# Patient Record
Sex: Male | Born: 1975 | Race: White | Hispanic: No | Marital: Married | Smoking: Never smoker
Health system: Southern US, Community
[De-identification: ages and names within clinical notes are randomized; demographics above are authoritative.]

## PROBLEM LIST (undated history)

## (undated) ENCOUNTER — Emergency Department: Payer: BC Managed Care – PPO

## (undated) ENCOUNTER — Emergency Department (HOSPITAL_BASED_OUTPATIENT_CLINIC_OR_DEPARTMENT_OTHER): Admission: EM | Payer: BLUE CROSS/BLUE SHIELD | Source: Home / Self Care

## (undated) DIAGNOSIS — I10 Essential (primary) hypertension: Secondary | ICD-10-CM

## (undated) DIAGNOSIS — F1010 Alcohol abuse, uncomplicated: Secondary | ICD-10-CM

## (undated) DIAGNOSIS — Z86010 Personal history of colonic polyps: Secondary | ICD-10-CM

## (undated) DIAGNOSIS — Z87442 Personal history of urinary calculi: Secondary | ICD-10-CM

## (undated) DIAGNOSIS — M549 Dorsalgia, unspecified: Secondary | ICD-10-CM

## (undated) DIAGNOSIS — E66812 Obesity, class 2: Secondary | ICD-10-CM

## (undated) DIAGNOSIS — K602 Anal fissure, unspecified: Secondary | ICD-10-CM

## (undated) DIAGNOSIS — L409 Psoriasis, unspecified: Secondary | ICD-10-CM

## (undated) DIAGNOSIS — G4700 Insomnia, unspecified: Secondary | ICD-10-CM

## (undated) DIAGNOSIS — F32A Depression, unspecified: Secondary | ICD-10-CM

## (undated) DIAGNOSIS — F419 Anxiety disorder, unspecified: Secondary | ICD-10-CM

## (undated) DIAGNOSIS — K760 Fatty (change of) liver, not elsewhere classified: Secondary | ICD-10-CM

## (undated) DIAGNOSIS — E119 Type 2 diabetes mellitus without complications: Secondary | ICD-10-CM

## (undated) DIAGNOSIS — R635 Abnormal weight gain: Secondary | ICD-10-CM

## (undated) DIAGNOSIS — R7303 Prediabetes: Secondary | ICD-10-CM

## (undated) DIAGNOSIS — E669 Obesity, unspecified: Secondary | ICD-10-CM

## (undated) DIAGNOSIS — N529 Male erectile dysfunction, unspecified: Secondary | ICD-10-CM

## (undated) DIAGNOSIS — R0602 Shortness of breath: Secondary | ICD-10-CM

## (undated) DIAGNOSIS — E785 Hyperlipidemia, unspecified: Secondary | ICD-10-CM

## (undated) DIAGNOSIS — E291 Testicular hypofunction: Secondary | ICD-10-CM

## (undated) HISTORY — DX: Anxiety disorder, unspecified: F41.9

## (undated) HISTORY — DX: Fatty (change of) liver, not elsewhere classified: K76.0

## (undated) HISTORY — DX: Depression, unspecified: F32.A

## (undated) HISTORY — DX: Abnormal weight gain: R63.5

## (undated) HISTORY — DX: Psoriasis, unspecified: L40.9

## (undated) HISTORY — PX: APPENDECTOMY: SHX54

## (undated) HISTORY — DX: Type 2 diabetes mellitus without complications: E11.9

## (undated) HISTORY — PX: OTHER SURGICAL HISTORY: SHX169

## (undated) HISTORY — DX: Prediabetes: R73.03

## (undated) HISTORY — DX: Dorsalgia, unspecified: M54.9

## (undated) HISTORY — DX: Hyperlipidemia, unspecified: E78.5

## (undated) HISTORY — DX: Personal history of colonic polyps: Z86.010

## (undated) HISTORY — DX: Testicular hypofunction: E29.1

## (undated) HISTORY — DX: Anal fissure, unspecified: K60.2

## (undated) HISTORY — DX: Obesity, class 2: E66.812

## (undated) HISTORY — DX: Male erectile dysfunction, unspecified: N52.9

## (undated) HISTORY — DX: Obesity, unspecified: E66.9

## (undated) HISTORY — DX: Alcohol abuse, uncomplicated: F10.10

## (undated) HISTORY — DX: Shortness of breath: R06.02

## (undated) HISTORY — DX: Essential (primary) hypertension: I10

## (undated) HISTORY — DX: Insomnia, unspecified: G47.00

---

## 1999-02-26 HISTORY — PX: HAND SURGERY: SHX662

## 2000-04-09 ENCOUNTER — Encounter: Payer: Self-pay | Admitting: *Deleted

## 2000-04-09 ENCOUNTER — Ambulatory Visit (HOSPITAL_COMMUNITY): Admission: RE | Admit: 2000-04-09 | Discharge: 2000-04-09 | Payer: Self-pay | Admitting: *Deleted

## 2000-04-12 ENCOUNTER — Encounter: Payer: Self-pay | Admitting: Orthopedic Surgery

## 2000-04-12 ENCOUNTER — Ambulatory Visit (HOSPITAL_COMMUNITY): Admission: RE | Admit: 2000-04-12 | Discharge: 2000-04-12 | Payer: Self-pay | Admitting: Orthopedic Surgery

## 2004-08-02 ENCOUNTER — Ambulatory Visit: Payer: Self-pay | Admitting: Family Medicine

## 2005-01-30 ENCOUNTER — Ambulatory Visit: Payer: Self-pay | Admitting: Internal Medicine

## 2005-03-08 ENCOUNTER — Ambulatory Visit: Payer: Self-pay | Admitting: Family Medicine

## 2006-10-04 ENCOUNTER — Emergency Department (HOSPITAL_COMMUNITY): Admission: EM | Admit: 2006-10-04 | Discharge: 2006-10-04 | Payer: Self-pay | Admitting: Family Medicine

## 2007-03-09 ENCOUNTER — Ambulatory Visit: Payer: Self-pay | Admitting: Family Medicine

## 2007-03-09 DIAGNOSIS — R030 Elevated blood-pressure reading, without diagnosis of hypertension: Secondary | ICD-10-CM | POA: Insufficient documentation

## 2007-03-09 DIAGNOSIS — R42 Dizziness and giddiness: Secondary | ICD-10-CM | POA: Insufficient documentation

## 2007-09-04 ENCOUNTER — Ambulatory Visit: Payer: Self-pay | Admitting: Family Medicine

## 2007-09-04 DIAGNOSIS — F411 Generalized anxiety disorder: Secondary | ICD-10-CM | POA: Insufficient documentation

## 2007-09-04 DIAGNOSIS — J029 Acute pharyngitis, unspecified: Secondary | ICD-10-CM | POA: Insufficient documentation

## 2007-09-04 DIAGNOSIS — F329 Major depressive disorder, single episode, unspecified: Secondary | ICD-10-CM | POA: Insufficient documentation

## 2007-09-04 LAB — CONVERTED CEMR LAB: Rapid Strep: NEGATIVE

## 2007-11-18 ENCOUNTER — Telehealth: Payer: Self-pay | Admitting: Family Medicine

## 2008-01-18 ENCOUNTER — Ambulatory Visit: Payer: Self-pay | Admitting: Family Medicine

## 2008-01-18 DIAGNOSIS — G4700 Insomnia, unspecified: Secondary | ICD-10-CM | POA: Insufficient documentation

## 2009-11-28 ENCOUNTER — Telehealth: Payer: Self-pay | Admitting: Family Medicine

## 2009-12-05 ENCOUNTER — Ambulatory Visit: Payer: Self-pay | Admitting: Family Medicine

## 2009-12-05 LAB — CONVERTED CEMR LAB
ALT: 89 units/L — ABNORMAL HIGH (ref 0–53)
AST: 57 units/L — ABNORMAL HIGH (ref 0–37)
Albumin: 4.4 g/dL (ref 3.5–5.2)
Alkaline Phosphatase: 60 units/L (ref 39–117)
BUN: 15 mg/dL (ref 6–23)
Basophils Absolute: 0 10*3/uL (ref 0.0–0.1)
Basophils Relative: 0.8 % (ref 0.0–3.0)
Bilirubin Urine: NEGATIVE
Bilirubin, Direct: 0.1 mg/dL (ref 0.0–0.3)
Blood in Urine, dipstick: NEGATIVE
CO2: 25 meq/L (ref 19–32)
Calcium: 9.3 mg/dL (ref 8.4–10.5)
Chloride: 103 meq/L (ref 96–112)
Cholesterol: 243 mg/dL — ABNORMAL HIGH (ref 0–200)
Creatinine, Ser: 1.2 mg/dL (ref 0.4–1.5)
Direct LDL: 170.5 mg/dL
Eosinophils Absolute: 0.2 10*3/uL (ref 0.0–0.7)
Eosinophils Relative: 4.7 % (ref 0.0–5.0)
GFR calc non Af Amer: 74.67 mL/min (ref >60)
Glucose, Bld: 87 mg/dL (ref 70–99)
Glucose, Urine, Semiquant: NEGATIVE
HCT: 46 % (ref 39.0–52.0)
HDL: 38.5 mg/dL — ABNORMAL LOW (ref >39.00)
Hemoglobin: 16 g/dL (ref 13.0–17.0)
Ketones, urine, test strip: NEGATIVE
Lymphocytes Relative: 40.1 % (ref 12.0–46.0)
Lymphs Abs: 2 10*3/uL (ref 0.7–4.0)
MCHC: 34.8 g/dL (ref 30.0–36.0)
MCV: 94.4 fL (ref 78.0–100.0)
Monocytes Absolute: 0.4 10*3/uL (ref 0.1–1.0)
Monocytes Relative: 8.8 % (ref 3.0–12.0)
Neutro Abs: 2.2 10*3/uL (ref 1.4–7.7)
Neutrophils Relative %: 45.6 % (ref 43.0–77.0)
Nitrite: NEGATIVE
Platelets: 164 10*3/uL (ref 150.0–400.0)
Potassium: 3.9 meq/L (ref 3.5–5.1)
Protein, U semiquant: NEGATIVE
RBC: 4.88 M/uL (ref 4.22–5.81)
RDW: 12.2 % (ref 11.5–14.6)
Sodium: 137 meq/L (ref 135–145)
Specific Gravity, Urine: 1.025
TSH: 1.43 microintl units/mL (ref 0.35–5.50)
Total Bilirubin: 0.9 mg/dL (ref 0.3–1.2)
Total CHOL/HDL Ratio: 6
Total Protein: 7.5 g/dL (ref 6.0–8.3)
Triglycerides: 179 mg/dL — ABNORMAL HIGH (ref 0.0–149.0)
Urobilinogen, UA: 0.2
VLDL: 35.8 mg/dL (ref 0.0–40.0)
WBC Urine, dipstick: NEGATIVE
WBC: 4.9 10*3/uL (ref 4.5–10.5)
pH: 5.5

## 2009-12-08 ENCOUNTER — Ambulatory Visit: Payer: Self-pay | Admitting: Family Medicine

## 2009-12-08 DIAGNOSIS — E1159 Type 2 diabetes mellitus with other circulatory complications: Secondary | ICD-10-CM | POA: Insufficient documentation

## 2009-12-08 DIAGNOSIS — M549 Dorsalgia, unspecified: Secondary | ICD-10-CM | POA: Insufficient documentation

## 2009-12-08 DIAGNOSIS — I10 Essential (primary) hypertension: Secondary | ICD-10-CM

## 2009-12-08 DIAGNOSIS — I152 Hypertension secondary to endocrine disorders: Secondary | ICD-10-CM | POA: Insufficient documentation

## 2009-12-11 ENCOUNTER — Telehealth: Payer: Self-pay | Admitting: Family Medicine

## 2009-12-22 ENCOUNTER — Ambulatory Visit: Payer: Self-pay | Admitting: Psychology

## 2010-01-03 ENCOUNTER — Ambulatory Visit: Payer: Self-pay | Admitting: Psychology

## 2010-01-08 ENCOUNTER — Ambulatory Visit: Payer: Self-pay | Admitting: Family Medicine

## 2010-01-26 ENCOUNTER — Ambulatory Visit: Payer: Self-pay | Admitting: Psychology

## 2010-02-02 ENCOUNTER — Ambulatory Visit: Payer: Self-pay | Admitting: Psychology

## 2010-02-07 ENCOUNTER — Telehealth (INDEPENDENT_AMBULATORY_CARE_PROVIDER_SITE_OTHER): Payer: Self-pay | Admitting: *Deleted

## 2010-02-16 ENCOUNTER — Ambulatory Visit: Payer: Self-pay | Admitting: Psychology

## 2010-03-27 NOTE — Progress Notes (Signed)
Summary: med refill last prescribed by dr little  Phone Note Call from Patient Call back at Home Phone [phone]   Caller: Patient Call For: Dorena Cookey MD Summary of Call: pt needs refill lisinopril 5m call into [facility] [phone]. Pt is sch for cpx 12-08-2009. DR Little prescribed bp med. Initial call taken by: NGlo Herring  November 28, 2009 10:31 AM  Follow-up for Phone Call        dispense a hundred tablets, directions, one q.a.m. for high blood pressure, refills x 3 Follow-up by: JDorena CookeyMD,  November 28, 2009 10:54 AM    New/Updated Medications: LISINOPRIL 10 MG TABS (LISINOPRIL) take one tab by mouth every morning Prescriptions: LISINOPRIL 10 MG TABS (LISINOPRIL) take one tab by mouth every morning  #100 x 3   Entered by:   RWestley HummerCMA (AP[place]   Authorized by:   JDorena CookeyMD   Signed by:   RWestley HummerCMA (AT[place] on 11/28/2009   Method used:   Electronically to        [facility] #[id number] (retail)       [address]      [place]  [address]      Ph: [phone] [phone]      Fax: [phone]  RxID:   [id number]

## 2010-03-27 NOTE — Progress Notes (Signed)
  Phone Note Outgoing Call   Summary of Call: I called Josef to explain to him.  His x-rays.  I'm not sure if that 4-mm slip is significant or not.  Will start with physical therapy.  The pain does not resolve with physical therapy and will refer her to neurosurgery for epidural steroid injection Initial call taken by: Dorena Cookey MD,  December 11, 2009 1:45 PM

## 2010-03-27 NOTE — Assessment & Plan Note (Signed)
Summary: 1 month f/u//alp   Vital Signs:  Patient profile:   35 year old male Weight:      238 pounds Temp:     98.4 degrees F oral BP sitting:   110 / 80  (left arm) Cuff size:   regular  Vitals Entered By: Westley Hummer CMA Deborra Medina) (January 08, 2010 11:45 AM) CC: follow-up visit   CC:  follow-up visit.  History of Present Illness: Tony [name] is a 35 year old, single male, who comes in today for evaluation of multiple issues.  His underlying hypertension, treated with lisinopril, 10 mg daily, BP 110/80.  His been seeing Dr. Cheryln [name] for two sessions now for evaluation and treatment of anxiety and depression.  He feels like this is going well and was to continue his sessions.  He takes Ativan and/or Ambien nightly p.r.n. for sleep.  He continues to have chronic low back pain.  We recommended physical therapy and/or neurologic evaluation with the potential for epidural steroid injections.  Pamala Hurry declines this at this time because of cost.  To try to treated home on his own.  He states the Motrin, 600 mg twice daily with food.  He spent off all alcohol for one month.  Will recheck his labs  Allergies: No Known Drug Allergies  Review of Systems      See HPI  Physical Exam  General:  Well-developed,well-nourished,in no acute distress; alert,appropriate and cooperative throughout examination   Impression & Recommendations:  Problem # 1:  BACK PAIN (ICD-724.5) Assessment Improved  Orders: Specimen Handling (99000)  Problem # 2:  HYPERTENSION (ICD-401.9) Assessment: Improved  His updated medication list for this problem includes:    Lisinopril 10 Mg Tabs (Lisinopril) .Marland Kitchen... Take one tab by mouth every morning  Orders: Venipuncture (70623) TLB-BMP (Basic Metabolic Panel-BMET) (76283-TDVVOHY) TLB-Hepatic/Liver Function Pnl (80076-HEPATIC) Specimen Handling (99000)  Problem # 3:  INSOMNIA (ICD-780.52) Assessment: Improved  His updated medication list for this  problem includes:    Ambien 5 Mg Tabs (Zolpidem tartrate) .Marland Kitchen... Prn  Orders: Specimen Handling (99000)  Complete Medication List: 1)  Ativan 1 Mg Tabs (Lorazepam) .Marland Kitchen.. 1 tab @ bedtime 2)  Lisinopril 10 Mg Tabs (Lisinopril) .... Take one tab by mouth every morning 3)  Ambien 5 Mg Tabs (Zolpidem tartrate) .... Prn  Patient Instructions: 1)  continue your medications.  I will call you when I get your  Laboratory. work back...........Marland Kitchen Another option would be to consider acupuncture.........Marland Kitchen Curtis Sites...............   Orders Added: 1)  Venipuncture [07371] 2)  TLB-BMP (Basic Metabolic Panel-BMET) [06269-SWNIOEV] 3)  TLB-Hepatic/Liver Function Pnl [80076-HEPATIC] 4)  Est. Patient Level III [03500] 5)  Specimen Handling [93818]

## 2010-03-27 NOTE — Assessment & Plan Note (Signed)
Summary: cpx/njr   Vital Signs:  Patient profile:   35 year old male Height:      70 inches Weight:      241 pounds BMI:     34.70 Temp:     98.2 degrees F oral Pulse rate:   90 / minute Pulse rhythm:   regular BP sitting:   120 / 80  Vitals Entered By: Deanna Artis CMA (December 08, 2009 8:42 AM) CC: cpx Is Patient Diabetic? No Pain Assessment Patient in pain? no        CC:  cpx.  History of Present Illness: Tony [name] is a 35 year old single male, nonsmoker, who comes in today for physical evaluation because of underlying hypertension.  A new problem of low back pain and panic attacks.  His hypertension is treated with lisinopril 10 mg daily.  BP 120/80.  He also takes Ambien 5 mg nightly p.r.n. for sleep dysfunction and lorazepam 1 mg p.r.n. for panic attacks.  We discussed risks, options for treating the panic attacks however, he is not interested in this point was seeing a psychologist.  I will continue to encourage him to see Dr. Cheryln [name].  He said low back pain for the past 7 months.  No history of trauma.  He states his back pain is constant, Jesse Sans, the sharp, [employer] scale of one to 8, is located in the mid lumbar back area does not radiate down his legs.  The pain is decreased by walking and increase by twisting and playing golf.  He never had a problem.  His back like this before .  He states he is a nonsmoker and does drink a few beers on the weekends.  Current Medications (verified): 1)  Klonopin 0.5 Mg  Tabs (Clonazepam) .Marland Kitchen.. 1 Tab @ Bedtime 2)  Hydromet 5-1.5 Mg/53m Syrp (Hydrocodone-Homatropine) ..Marland Kitchen. 1 or 2 Tsp Tid As Needed 3)  Ativan 1 Mg Tabs (Lorazepam) ..Marland Kitchen. 1 Tab @ Bedtime 4)  Lisinopril 10 Mg Tabs (Lisinopril) .... Take One Tab By Mouth Every Morning  Allergies: No Known Drug Allergies  Past History:  Past medical, surgical, family and social histories (including risk factors) reviewed, and no changes noted (except as noted  below).  Past Medical History: Reviewed history from 09/04/2007 and no changes required. ADD Anxiety Depression  Family History: Reviewed history from 09/04/2007 and no changes required. Family History Hypertension Family History of CAD Male 1st degree relative <60  Social History: Reviewed history from 03/09/2007 and no changes required. Occupation: Single Alcohol use-no Drug use-no Regular exercise-yes  Review of Systems      See HPI  Physical Exam  General:  Well-developed,well-nourished,in no acute distress; alert,appropriate and cooperative throughout examination Head:  Normocephalic and atraumatic without obvious abnormalities. No apparent alopecia or balding. Eyes:  No corneal or conjunctival inflammation noted. EOMI. Perrla. Funduscopic exam benign, without hemorrhages, exudates or papilledema. Vision grossly normal. Ears:  External ear exam shows no significant lesions or deformities.  Otoscopic examination reveals clear canals, tympanic membranes are intact bilaterally without bulging, retraction, inflammation or discharge. Hearing is grossly normal bilaterally. Nose:  External nasal examination shows no deformity or inflammation. Nasal mucosa are pink and moist without lesions or exudates. Mouth:  Oral mucosa and oropharynx without lesions or exudates.  Teeth in good repair. Neck:  No deformities, masses, or tenderness noted. Chest Wall:  No deformities, masses, tenderness or gynecomastia noted. Breasts:  No masses or gynecomastia noted Lungs:  Normal respiratory effort, chest expands symmetrically. Lungs are  clear to auscultation, no crackles or wheezes. Heart:  Normal rate and regular rhythm. S1 and S2 normal without gallop, murmur, click, rub or other extra sounds. Abdomen:  Bowel sounds positive,abdomen soft and non-tender without masses, organomegaly or hernias noted. Genitalia:  Testes bilaterally descended without nodularity, tenderness or masses. No scrotal  masses or lesions. No penis lesions or urethral discharge. Msk:  No deformity or scoliosis noted of thoracic or lumbar spine.   Pulses:  R and L carotid,radial,femoral,dorsalis pedis and posterior tibial pulses are full and equal bilaterally Extremities:  No clubbing, cyanosis, edema, or deformity noted with normal full range of motion of all joints.   Neurologic:  No cranial nerve deficits noted. Station and gait are normal. Plantar reflexes are down-going bilaterally. DTRs are symmetrical throughout. Sensory, motor and coordinative functions appear intact. Skin:  Intact without suspicious lesions or rashes Cervical Nodes:  No lymphadenopathy noted Axillary Nodes:  No palpable lymphadenopathy Inguinal Nodes:  No significant adenopathy Psych:  Oriented X3 and slightly anxious.     Problems:  Medical Problems Added: 1)  Dx of Routine General Medical Exam@[facility]  (ICD-V70.0) 2)  Dx of Back Pain  (ICD-724.5) 3)  Dx of Hypertension  (ICD-401.9)  Impression & Recommendations:  Problem # 1:  INSOMNIA (ICD-780.52) Assessment Unchanged  His updated medication list for this problem includes:    Ambien 5 Mg Tabs (Zolpidem tartrate) .Marland Kitchen... Prn  Orders: Prescription Created Electronically [phone])  Problem # 2:  ANXIETY (ICD-300.00) Assessment: Unchanged  The following medications were removed from the medication list:    Klonopin 0.5 Mg Tabs (Clonazepam) .Marland Kitchen... 1 tab @ bedtime His updated medication list for this problem includes:    Ativan 1 Mg Tabs (Lorazepam) .Marland Kitchen... 1 tab @ bedtime  Orders: Prescription Created Electronically [phone])  Problem # 3:  HYPERTENSION (ICD-401.9) Assessment: Improved  His updated medication list for this problem includes:    Lisinopril 10 Mg Tabs (Lisinopril) .Marland Kitchen... Take one tab by mouth every morning  Orders: Prescription Created Electronically [phone])  Problem # 4:  BACK PAIN (ICD-724.5) Assessment: New  Orders: T-Lumbar Spine w/Flex &  Ext 4 Views [phone]) Prescription Created Electronically [phone]) Physical Therapy Referral (PT)  Complete Medication List: 1)  Ativan 1 Mg Tabs (Lorazepam) .Marland Kitchen.. 1 tab @ bedtime 2)  Lisinopril 10 Mg Tabs (Lisinopril) .... Take one tab by mouth every morning 3)  Ambien 5 Mg Tabs (Zolpidem tartrate) .... Prn  Other Orders: Admin 1st Vaccine (38453) Flu Vaccine 59yr + [phone]  Patient Instructions: 1)  go to the main office for x-rays of your back.  I will call you the report. 2)  We will also do to set up for physical therapy to help relieve the pain.  In the meantime take Motrin 600 mg twice daily. 3)  I would recommend Dr. DApolonio Schneiderspsychologist to address the issues of panic that we discussed. 4)  Avoid all alcohol. 5)  Return in 4 weeks for follow-up Prescriptions: AMBIEN 5 MG TABS (ZOLPIDEM TARTRATE) prn  #30 x 3   Entered and Authorized by:   JDorena CookeyMD   Signed by:   JDorena CookeyMD on 12/08/2009   Method used:   Print then Give to Patient   RxID:   [id number] 1 MG TABS (LORAZEPAM) 1 tab @ bedtime  #30 x 3   Entered and Authorized by:   JDorena CookeyMD   Signed by:   JDorena CookeyMD on 12/08/2009  Method used:   Print then Give to Patient   RxID:   [id number] LISINOPRIL 10 MG TABS (LISINOPRIL) take one tab by mouth every morning  #100 x 3   Entered and Authorized by:   Dorena Cookey MD   Signed by:   Dorena Cookey MD on 12/08/2009   Method used:   Electronically to        [facility]  [phone]* (retail)       [address]       [place], [place]  [address]       Ph: [phone] or [phone]       Fax: [phone]   RxID:   [id number]  Flu Vaccine Consent Questions     Do you have a history of severe allergic reactions to this vaccine? no    Any prior history of allergic reactions to egg and/or gelatin? no    Do you have a sensitivity to the preservative Thimersol? no    Do you have a  past history of Guillan-Barre Syndrome? no    Do you currently have an acute febrile illness? no    Have you ever had a severe reaction to latex? no    Vaccine information given and explained to patient? yes    Are you currently pregnant? no    Lot Number:[id number]   Exp Date:08/25/2010   Site Given  Left Deltoid IM       Fax: [phone]   RxID:   [id number]    .[email or web address]

## 2010-03-29 NOTE — Progress Notes (Signed)
  Phone Note Outgoing Call   Call placed by: Joyce Gross Summary of Call: Called patient and he said he would return for labs on Tues. or Wed. of next week.

## 2010-07-13 NOTE — Op Note (Signed)
[place]  Patient:    Tony [name], Tony [name]                    MRN: [id number] Proc. Date: 04/12/00 Attending:  Rob Hickman, M.D.                           Operative Report  DATE OF BIRTH: [birth year]  PREOPERATIVE DIAGNOSIS: Small finger proximal phalanx fracture, displaced with angulation at apex volarly.  POSTOPERATIVE DIAGNOSIS: Small finger proximal phalanx fracture, displaced with angulation at apex volarly.  OPERATION/PROCEDURE: Closed reduction and percutaneous pinning with Eaton-Belsky technique using .045 Kirschner wires.  SURGEON: Rob Hickman, M.D.  ASSISTANTElodia [name]. Clabe Seal, P.A.  ANESTHESIA: LMA, general.  COMPLICATIONS: None.  TOURNIQUET TIME: None.  ESTIMATED BLOOD LOSS: Minimal.  INDICATIONS FOR PROCEDURE: This patient is a very pleasant 35 year old white male the above mentioned diagnosis.  He has angulation and displacement.  I have discussed with him the risks and benefits of surgical intervention including risk of infection, bleeding, anesthesia, damage to normal structures and need for further surgery, and with this in mind he has asked to proceed. All questions have been answered preoperatively.  OPERATIVE FINDINGS: Displaced proximal phalanx fracture was reduced successfully and pinned without difficulty using Eaton-Belsky technique.  The patient had full range of motion about the MCP and PIP joints after pinning technique was employed, and excellent stability about the fracture site as noted under fluoroscopy.  DESCRIPTION OF PROCEDURE: The patient was seen by myself and anesthesia preoperatively and then taken to the operating room suite and smooth induction of anesthesia was accomplished without difficulty.  Following this he was placed in the supine position and appropriately padded and prepped and draped in the usual sterile fashion about the left upper extremity.  Once this was done the  patient underwent a manipulative reduction technique about the left small finger, which was displaced and angulated.  Reduction technique accomplished adequate reduction.  Following this Eaton-Belsky pinning technique was performed.  This was done by entering about the dorsal aspect of the hand and pinning the proximal phalanx with two 0.045 K wires, which centered down the shaft of the proximal phalanx.  The MCP joint and PIP joint were not encroached upon.  The patient had wires placed without difficulty and excellent stability was noted under fluoroscopy.  I was happy with the position and alignment, and nail bed direction toward the scaphoid tuberosity. The patient had excellent splay of his fingers, no significant displacement, and recreation of normal anatomy to a satisfactory degree.  I was happy with this and following this pins were cut and clipped and pin caps were placed.  A sterile compressive dressing was placed followed by volar and dorsal plaster splints.  ______ without difficulty.  He tolerated the procedure well without difficulty.  He was awakened from general anesthesia and taken to the recovery room in stable condition.  Ann sponge, needle, and instrument counts were reported as correct.  There were no immediate or intraoperative complications.  FOLLOW-UP: The patient will be given Keflex, Percocet, Phenergan, and Robaxin for postoperative pharmacologic management.  He will return to our office in seven to ten days for follow-up, and is to notify me should any problems, questions, or concerns arise, or abrupt changes occur prior to seven to ten days. DD:  04/12/00 TD:  04/13/00 Job: [id number]

## 2010-09-05 ENCOUNTER — Other Ambulatory Visit: Payer: Self-pay | Admitting: Family Medicine

## 2010-12-10 LAB — POCT RAPID STREP A: Streptococcus, Group A Screen (Direct): NEGATIVE

## 2011-08-26 ENCOUNTER — Other Ambulatory Visit: Payer: Self-pay | Admitting: *Deleted

## 2011-08-26 MED ORDER — LISINOPRIL 10 MG PO TABS: 10.0000 mg | ORAL_TABLET | Freq: Every day | ORAL | Status: DC

## 2011-09-30 ENCOUNTER — Other Ambulatory Visit: Payer: Self-pay | Admitting: Family Medicine

## 2011-10-30 ENCOUNTER — Other Ambulatory Visit: Payer: Self-pay | Admitting: Family Medicine

## 2011-10-30 ENCOUNTER — Telehealth: Payer: Self-pay | Admitting: Family Medicine

## 2011-10-30 NOTE — Telephone Encounter (Signed)
Pt has cpx sch for tomorrow at 2:45pm. Pt said that he can not come in, in the a.m to get lab work done. Pt is req call back from [facility] re: labs. Pt has been informed that refills require an ov with pcp.

## 2011-10-31 ENCOUNTER — Ambulatory Visit (INDEPENDENT_AMBULATORY_CARE_PROVIDER_SITE_OTHER): Payer: BC Managed Care – PPO | Admitting: Family Medicine

## 2011-10-31 ENCOUNTER — Encounter: Payer: Self-pay | Admitting: Family Medicine

## 2011-10-31 VITALS — BP 120/80 | Temp 98.5°F | Ht 69.5 in | Wt 233.0 lb

## 2011-10-31 DIAGNOSIS — R6882 Decreased libido: Secondary | ICD-10-CM

## 2011-10-31 DIAGNOSIS — R200 Anesthesia of skin: Secondary | ICD-10-CM

## 2011-10-31 DIAGNOSIS — F411 Generalized anxiety disorder: Secondary | ICD-10-CM

## 2011-10-31 DIAGNOSIS — L408 Other psoriasis: Secondary | ICD-10-CM

## 2011-10-31 DIAGNOSIS — R209 Unspecified disturbances of skin sensation: Secondary | ICD-10-CM

## 2011-10-31 DIAGNOSIS — G4700 Insomnia, unspecified: Secondary | ICD-10-CM

## 2011-10-31 DIAGNOSIS — L409 Psoriasis, unspecified: Secondary | ICD-10-CM

## 2011-10-31 DIAGNOSIS — I10 Essential (primary) hypertension: Secondary | ICD-10-CM

## 2011-10-31 MED ORDER — CLONAZEPAM 0.5 MG PO TABS: ORAL_TABLET | ORAL | Status: DC

## 2011-10-31 MED ORDER — ZOLPIDEM TARTRATE 5 MG PO TABS: 5.0000 mg | ORAL_TABLET | Freq: Every evening | ORAL | Status: DC | PRN

## 2011-10-31 MED ORDER — TRIAMCINOLONE ACETONIDE 0.025 % EX OINT: TOPICAL_OINTMENT | Freq: Two times a day (BID) | CUTANEOUS | Status: AC

## 2011-10-31 MED ORDER — LISINOPRIL 10 MG PO TABS: 10.0000 mg | ORAL_TABLET | Freq: Every day | ORAL | Status: DC

## 2011-10-31 MED ORDER — CALCIPOTRIENE 0.005 % EX CREA: TOPICAL_CREAM | Freq: Two times a day (BID) | CUTANEOUS | Status: DC

## 2011-10-31 NOTE — Patient Instructions (Signed)
Continue your blood pressure medication daily  For the psoriasis use a combination of Dovonex and the steroid gel twice daily  Klonopin 0.53 times daily for anxiety  Ambien 5 mg one half tab each bedtime when necessary for sleep  The triamcinolone gel for rectal irritation  Zyrtec 10 mg nightly at bedtime for the urticaria  We will check a testosterone level  We will also get you set up for a consult to neurology to evaluate the numbness in your leg

## 2011-10-31 NOTE — Progress Notes (Signed)
Subjective:    Patient ID: Tony [name], male    [name]: 11-30-1975, 36 y.o.   MRN: [id number]  HPI Tony [name] is a 36 year old single male nonsmoker who comes in today for evaluation of hypertension  He takes lisinopril 10 mg daily BP 120/80  We've referred him to Dr. Geraldine [name] last winter because of anxiety and depression. He was diagnosed with a general anxiety disorder. 30% of his anxiety went away when he got rid of his girlfriend. Now he's on Klonopin 0.53 times daily and Ambien 5 mg one half tab each bedtime when necessary for sleep. He states he is functioning well and feels well and he has resumed his exercise program.  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 He went to her chiropractor who told him his spine was corrected. The numbness now is constant dull ache at 2 on a scale of 1-10. He also has psoriasis and is involving his scalp and the head of his penis  He also has itching of his rectum at bedtime  He also has urticaria at bedtime for which he takes Benadryl. He also has decreased sex drive and thinks he may have a low testosterone level   Review of Systems  Constitutional: Negative.   HENT: Negative.   Eyes: Negative.   Respiratory: Negative.   Cardiovascular: Negative.   Gastrointestinal: Negative.   Genitourinary: Negative.   Musculoskeletal: Negative.   Skin: Negative.   Neurological: Negative.   Hematological: Negative.   Psychiatric/Behavioral: Negative.        Objective:   Physical Exam  Constitutional: He is oriented to person, place, and time. He appears well-developed and well-nourished.  HENT:  Head: Normocephalic and atraumatic.  Right Ear: External ear normal.  Left Ear: External ear normal.  Nose: Nose normal.  Mouth/Throat: Oropharynx is clear and moist.  Eyes: Conjunctivae and EOM are normal. Pupils are equal, round,  and reactive to light.  Neck: Normal range of motion. Neck supple. No JVD present. No tracheal deviation present. No thyromegaly present.  Cardiovascular: Normal rate, regular rhythm, normal heart sounds and intact distal pulses.  Exam reveals no gallop and no friction rub.   No murmur heard. Pulmonary/Chest: Effort normal and breath sounds normal. No stridor. No respiratory distress. He has no wheezes. He has no rales. He exhibits no tenderness.  Abdominal: Soft. Bowel sounds are normal. He exhibits no distension and no mass. There is no tenderness. There is no rebound and no guarding.  Genitourinary: Penis normal.       Psoriasis around the penis  Musculoskeletal: Normal range of motion. He exhibits no edema and no tenderness.       The legs appear normal the area that he describes is normal in appearance however he says he can feel me touch him but his did sensation is diminished. His pulses did diminish when you raise his legs  Lymphadenopathy:    He has no cervical adenopathy.  Neurological: He is alert and oriented to person, place, and time. He has normal reflexes. No cranial nerve deficit. He exhibits normal muscle tone.  Skin: Skin is warm and dry. No rash noted. No erythema. No pallor.  Psychiatric: He has a normal mood and affect. His behavior is normal. Judgment and thought content normal.          Assessment & Plan:  Healthy male  General anxiety disorder continue Klonopin 0.5 3 times a day  Hypertension continue lisinopril 10 mg daily  Psoriasis continue Dovonex add steroid gel  . Is seen I steroid gel  Urticaria Zyrtec each bedtime  Numbness left anterior thigh refer to neurology for consult

## 2011-10-31 NOTE — Telephone Encounter (Signed)
Spoke with patient.

## 2011-11-05 ENCOUNTER — Other Ambulatory Visit (INDEPENDENT_AMBULATORY_CARE_PROVIDER_SITE_OTHER): Payer: BC Managed Care – PPO

## 2011-11-05 DIAGNOSIS — Z20828 Contact with and (suspected) exposure to other viral communicable diseases: Secondary | ICD-10-CM

## 2011-11-05 DIAGNOSIS — Z202 Contact with and (suspected) exposure to infections with a predominantly sexual mode of transmission: Secondary | ICD-10-CM

## 2011-11-05 DIAGNOSIS — Z206 Contact with and (suspected) exposure to human immunodeficiency virus [HIV]: Secondary | ICD-10-CM

## 2011-11-08 ENCOUNTER — Telehealth: Payer: Self-pay | Admitting: Family Medicine

## 2011-11-08 NOTE — Telephone Encounter (Signed)
Patient called stating that he would like a call back with lab results. Please assist.

## 2011-11-12 ENCOUNTER — Telehealth: Payer: Self-pay | Admitting: *Deleted

## 2011-11-12 NOTE — Telephone Encounter (Signed)
Patient is calling for lab results.

## 2011-11-14 ENCOUNTER — Other Ambulatory Visit: Payer: BC Managed Care – PPO

## 2011-11-14 LAB — CBC WITH DIFFERENTIAL/PLATELET
Basophils Absolute: 0.1 10*3/uL (ref 0.0–0.1)
Eosinophils Absolute: 0.3 10*3/uL (ref 0.0–0.7)
HCT: 45 % (ref 39.0–52.0)
Hemoglobin: 15.4 g/dL (ref 13.0–17.0)
Lymphs Abs: 2.5 10*3/uL (ref 0.7–4.0)
MCHC: 34.3 g/dL (ref 30.0–36.0)
Monocytes Relative: 10 % (ref 3.0–12.0)
Neutro Abs: 2.2 10*3/uL (ref 1.4–7.7)
RDW: 12.2 % (ref 11.5–14.6)

## 2011-11-14 LAB — BASIC METABOLIC PANEL
BUN: 13 mg/dL (ref 6–23)
CO2: 26 mEq/L (ref 19–32)
GFR: 73.82 mL/min (ref >60.00)
Glucose, Bld: 89 mg/dL (ref 70–99)
Potassium: 4 mEq/L (ref 3.5–5.1)

## 2011-11-14 LAB — POCT URINALYSIS DIPSTICK
Bilirubin, UA: NEGATIVE
Blood, UA: NEGATIVE
Nitrite, UA: NEGATIVE
Spec Grav, UA: 1.02
pH, UA: 5

## 2011-11-14 LAB — LIPID PANEL
Total CHOL/HDL Ratio: 7
VLDL: 66 mg/dL — ABNORMAL HIGH (ref 0.0–40.0)

## 2011-11-14 LAB — HEPATIC FUNCTION PANEL
Alkaline Phosphatase: 65 U/L (ref 39–117)
Bilirubin, Direct: 0.1 mg/dL (ref 0.0–0.3)

## 2011-11-20 ENCOUNTER — Telehealth: Payer: Self-pay | Admitting: Family Medicine

## 2011-11-20 NOTE — Telephone Encounter (Signed)
Pt would like blood work results °

## 2011-11-21 ENCOUNTER — Ambulatory Visit (INDEPENDENT_AMBULATORY_CARE_PROVIDER_SITE_OTHER): Payer: BC Managed Care – PPO | Admitting: Family Medicine

## 2011-11-21 DIAGNOSIS — Z23 Encounter for immunization: Secondary | ICD-10-CM

## 2011-11-21 NOTE — Telephone Encounter (Signed)
Pt is sch for today 3pm

## 2011-11-21 NOTE — Telephone Encounter (Signed)
Tony [name] called Tony [name]. went over his lab work. His testosterone level was low and I referred him to the [facility]rology Center. Also 2 of his liver function studies were slightly elevated he will direct no more than 2 beers per day and give followup LFTs in 2 months also cholesterol elevated he will work on his diet and exercise and we need to get a followup lipid panel in 2 months also  So followup lipid and liver panel in 2 months if you could please put that in the computer code number would be abnormal lab tests for the first week in December

## 2011-11-21 NOTE — Telephone Encounter (Signed)
Yes patient need both Tdap and Flu vaccine.  Please schedule.

## 2011-11-21 NOTE — Telephone Encounter (Signed)
Pt called and has sch ov for 01/28/12 at 9:00 as noted. Pt is wondering if he is due for tetanus and flu shot. Pls call.

## 2012-01-06 ENCOUNTER — Telehealth: Payer: Self-pay | Admitting: Family Medicine

## 2012-01-06 NOTE — Telephone Encounter (Signed)
Labs ordered.  Patient has surgery next Wednesday for his ankle.  He would like to know if he should stop any of his meds and if he is okay to have surgery?

## 2012-01-06 NOTE — Telephone Encounter (Signed)
Pt called and is req call back from nurse asap re: paperwork that pt brought in re: labs that Dr Beatrix Fetters ordered. Pt needs to get this lab done this wk asap. Pt req call back from [facility].

## 2012-01-07 NOTE — Telephone Encounter (Signed)
[name] please call,,,,,,,,,,,, he needs to call his surgeon and explain what medications he is on and they will tell him what to do

## 2012-01-07 NOTE — Telephone Encounter (Signed)
Spoke with patient.

## 2012-01-08 ENCOUNTER — Other Ambulatory Visit (INDEPENDENT_AMBULATORY_CARE_PROVIDER_SITE_OTHER): Payer: BC Managed Care – PPO

## 2012-01-08 DIAGNOSIS — E291 Testicular hypofunction: Secondary | ICD-10-CM

## 2012-01-09 LAB — TESTOSTERONE, FREE, TOTAL, SHBG
Testosterone, Free: 61.2 pg/mL (ref 47.0–244.0)
Testosterone-% Free: 2.9 % (ref 1.6–2.9)

## 2012-01-10 ENCOUNTER — Telehealth: Payer: Self-pay | Admitting: Family Medicine

## 2012-01-10 NOTE — Telephone Encounter (Signed)
Pt would like blood work results fax to Dr Dorina Hoyer [phone]. Pt has appt on Monday with Dr Diona Fanti at noon.

## 2012-01-10 NOTE — Telephone Encounter (Signed)
Patient is aware and copy faxed

## 2012-01-22 ENCOUNTER — Other Ambulatory Visit (INDEPENDENT_AMBULATORY_CARE_PROVIDER_SITE_OTHER): Payer: BC Managed Care – PPO

## 2012-01-28 ENCOUNTER — Ambulatory Visit: Payer: BC Managed Care – PPO | Admitting: Family Medicine

## 2012-02-05 ENCOUNTER — Other Ambulatory Visit: Payer: BC Managed Care – PPO

## 2012-02-12 ENCOUNTER — Other Ambulatory Visit: Payer: BC Managed Care – PPO

## 2012-02-13 ENCOUNTER — Other Ambulatory Visit (INDEPENDENT_AMBULATORY_CARE_PROVIDER_SITE_OTHER): Payer: BC Managed Care – PPO

## 2012-02-13 DIAGNOSIS — R799 Abnormal finding of blood chemistry, unspecified: Secondary | ICD-10-CM

## 2012-02-13 DIAGNOSIS — E785 Hyperlipidemia, unspecified: Secondary | ICD-10-CM

## 2012-02-13 DIAGNOSIS — IMO0002 Reserved for concepts with insufficient information to code with codable children: Secondary | ICD-10-CM

## 2012-02-13 LAB — LDL CHOLESTEROL, DIRECT: Direct LDL: 143.7 mg/dL

## 2012-02-13 LAB — LIPID PANEL
HDL: 27.5 mg/dL — ABNORMAL LOW (ref >39.00)
Triglycerides: 488 mg/dL — ABNORMAL HIGH (ref 0.0–149.0)

## 2012-02-13 LAB — HEPATIC FUNCTION PANEL
ALT: 50 U/L (ref 0–53)
Total Protein: 7.1 g/dL (ref 6.0–8.3)

## 2012-02-25 ENCOUNTER — Telehealth: Payer: Self-pay | Admitting: Family Medicine

## 2012-02-25 NOTE — Telephone Encounter (Signed)
Lab results already faxed

## 2012-02-25 NOTE — Telephone Encounter (Signed)
Patient called stating that he would like a call back with lab results as he needs them for surgical clearance. Please assist.

## 2012-03-02 ENCOUNTER — Telehealth: Payer: Self-pay | Admitting: Family Medicine

## 2012-03-02 NOTE — Telephone Encounter (Signed)
Emergent Call:  Called to verify if he has been cleared for surgery 03/04/12.  Please call back.

## 2012-03-02 NOTE — Telephone Encounter (Signed)
[name] please call

## 2012-03-02 NOTE — Telephone Encounter (Signed)
Left detailed message on machine for patient that lipids were out of range, but okay to go forward with the surgery

## 2012-03-02 NOTE — Telephone Encounter (Signed)
ok 

## 2012-03-02 NOTE — Telephone Encounter (Signed)
Patient is requesting lab results - lipids.  He is having reconstructive right ankle surgery Wednesday and would like to know if is okay to go ahead with that?

## 2012-03-02 NOTE — Telephone Encounter (Signed)
Patient Information:  Caller Name: Layken  Phone: [phone]  Patient: Anias, Bartol  Gender: Male  [name]: 02/28/1975  Age: 37 Years  PCP: [facility]  Office Follow Up:  Does the office need to follow up with this patient?: Yes  Instructions For The Office: Please call 03/02/12 to advise of abnormal lipid profile and implications for surgery  On 03/04/12.  RN Note:  Abnormal results noted from 02/13/12 lipid panel. Informed MD will be advised he called for lab  results and call back made from staff with MD recommendations.  Symptoms  Reason For Call & Symptoms:  Emergent Call: for lab results.  Scheduled for Right ankle reconstruction 03/04/12.  Asking if elevated lipids will interfere with surgery.  Reviewed Health History In EMR: N/A  Reviewed Medications In EMR: N/A  Reviewed Allergies In EMR: N/A  Reviewed Surgeries / Procedures: N/A  Date of Onset of Symptoms: 03/02/2012  Guideline(s) Used:  No Protocol Available - Information Only  Disposition Per Guideline:   Discuss with PCP and Callback by Nurse Today  Reason For Disposition Reached:   Nursing judgment  Advice Given:  N/A

## 2012-03-03 ENCOUNTER — Telehealth: Payer: Self-pay | Admitting: Family Medicine

## 2012-03-03 NOTE — Telephone Encounter (Signed)
Pt says he needs blood work prior to his 6 mo follow up appt on July 27, 2012. Do you want me to schedule labs?

## 2012-05-19 ENCOUNTER — Telehealth: Payer: Self-pay | Admitting: Family Medicine

## 2012-05-19 NOTE — Telephone Encounter (Signed)
pls advise

## 2012-05-19 NOTE — Telephone Encounter (Signed)
Patient calling to request change from [place] to [place].  States he tried [place], and it worked better, but it was more expensive due to no generic being available, so he "settled" for [employer].  States has had an insurance change, and would like to go with the [place] at this time.  States he has met his deductible.  Declines new triage or office appt at this time.  Info to office for provider review/Rx/callback.  Uses [facility].  May reach patient at [phone].  krs/can

## 2012-05-20 NOTE — Telephone Encounter (Signed)
Tony [name] please call Tony [name] recommended this juncture that he consult with Dr. Sheralyn Boatman who is an expert in sleep dysfunction since the current medications not working

## 2012-05-20 NOTE — Telephone Encounter (Signed)
Left detailed message on machine for patient

## 2012-05-25 NOTE — Telephone Encounter (Signed)
Patient called from the pharmacy. Wanted to know the status of his Lun[place] prior auth. I explained that we could not do a PA on a rx that we did not write. Asked him to relay that to the pharmacy and that they needed to fax the request back to Dr. Arvil Persons office, as that is who wrote for the [facility]. Pt understood.

## 2012-07-28 ENCOUNTER — Ambulatory Visit: Payer: BC Managed Care – PPO | Admitting: Family Medicine

## 2012-08-12 ENCOUNTER — Telehealth: Payer: Self-pay | Admitting: Family Medicine

## 2012-08-12 NOTE — Telephone Encounter (Addendum)
Pt has order for labs from Dr Tomasa Rand office. Pt states he has gotten them done here before, and lit looks like that is correct.  Is it ok to schedule him a lab appt for these labs?  Pt will bring the order.

## 2012-08-13 ENCOUNTER — Other Ambulatory Visit (INDEPENDENT_AMBULATORY_CARE_PROVIDER_SITE_OTHER): Payer: BC Managed Care – PPO

## 2012-08-13 ENCOUNTER — Ambulatory Visit: Payer: BC Managed Care – PPO | Admitting: Family Medicine

## 2012-08-13 DIAGNOSIS — E291 Testicular hypofunction: Secondary | ICD-10-CM

## 2012-08-13 NOTE — Telephone Encounter (Signed)
Spoke with patient and he will call back for lab appointment

## 2012-08-14 LAB — PROLACTIN: Prolactin: 10 ng/mL (ref 2.1–17.1)

## 2012-08-14 LAB — TESTOSTERONE, FREE, TOTAL, SHBG: Testosterone: 273 ng/dL — ABNORMAL LOW (ref 300–890)

## 2012-08-19 ENCOUNTER — Telehealth: Payer: Self-pay | Admitting: Family Medicine

## 2012-08-19 NOTE — Telephone Encounter (Signed)
PT called and stated that he would like to speak with you regarding labs from 01/2012. Please assist.

## 2012-08-20 ENCOUNTER — Encounter: Payer: Self-pay | Admitting: *Deleted

## 2012-08-20 NOTE — Telephone Encounter (Signed)
Spoke with patient. Copy of lab results mailed to home address and information on My Chart

## 2012-08-24 ENCOUNTER — Encounter: Payer: Self-pay | Admitting: Family Medicine

## 2012-08-24 ENCOUNTER — Ambulatory Visit (INDEPENDENT_AMBULATORY_CARE_PROVIDER_SITE_OTHER): Payer: BC Managed Care – PPO | Admitting: Family Medicine

## 2012-08-24 VITALS — BP 140/80 | Temp 98.8°F | Wt 248.0 lb

## 2012-08-24 DIAGNOSIS — I10 Essential (primary) hypertension: Secondary | ICD-10-CM

## 2012-08-24 NOTE — Progress Notes (Signed)
  Subjective:    Patient ID: Tony [name], male    [name]: 1976-01-08, 37 y.o.   MRN: [id number]  HPI Tony [name] is a 37 year old who comes in today for evaluation of hypertension  He's taking lisinopril 10 mg daily. He says he's been checking his blood pressure 4 times daily. BP at home averages 120/80  He's also taking Klonopin 0.5 3 times a day for chronic anxiety and Ambien each bedtime for sleep from Dr. Caprice Beaver   Review of Systems    review of systems otherwise negative  Objective:   Physical Exam Well-developed well-nourished male no acute distress BP right arm sitting position 140/80       Assessment & Plan:  Hypertension at goal check BP weekly return yearly.

## 2012-08-24 NOTE — Patient Instructions (Addendum)
Continue lisinopril 10 mg daily  Check your blood pressure once weekly,,,,,, Sunday morning  If you get an elevated reading and check your blood pressure daily for 2 weeks in a row.  If after that time your blood pressures have dropped back to normal then just go back and check it weekly  If however you get blood pressure readings that are elevated 2 weeks in a row call and make an appointment and come in to see [place] for reevaluation  Call you urologist to see if he has any other ideas

## 2012-09-11 ENCOUNTER — Other Ambulatory Visit: Payer: Self-pay | Admitting: Family Medicine

## 2012-10-15 ENCOUNTER — Other Ambulatory Visit: Payer: Self-pay | Admitting: Family Medicine

## 2012-12-31 ENCOUNTER — Other Ambulatory Visit: Payer: Self-pay

## 2013-02-08 ENCOUNTER — Other Ambulatory Visit: Payer: Self-pay | Admitting: Family Medicine

## 2013-03-05 ENCOUNTER — Telehealth: Payer: Self-pay | Admitting: Family Medicine

## 2013-03-05 MED ORDER — CALCIPOTRIENE 0.005 % EX CREA: TOPICAL_CREAM | CUTANEOUS | Status: DC

## 2013-03-05 NOTE — Telephone Encounter (Signed)
Pt is requesting a refill of calcipotriene (DOVONOX) 0.005 % cream. Please advise.

## 2013-03-08 ENCOUNTER — Telehealth: Payer: Self-pay | Admitting: Family Medicine

## 2013-03-08 MED ORDER — CALCIPOTRIENE 0.005 % EX CREA
TOPICAL_CREAM | CUTANEOUS | Status: DC
Start: 2013-03-08 — End: 2015-02-24

## 2013-03-08 NOTE — Telephone Encounter (Signed)
Pt states pharm doesn't have calcipotriene (DOVONOX) 0.005 % cream Can you resend?

## 2013-03-08 NOTE — Telephone Encounter (Signed)
Rx sent 

## 2013-07-07 ENCOUNTER — Other Ambulatory Visit: Payer: Self-pay | Admitting: Family Medicine

## 2014-01-13 ENCOUNTER — Encounter: Payer: Self-pay | Admitting: Family Medicine

## 2014-01-13 ENCOUNTER — Ambulatory Visit (INDEPENDENT_AMBULATORY_CARE_PROVIDER_SITE_OTHER): Payer: BC Managed Care – PPO | Admitting: Family Medicine

## 2014-01-13 ENCOUNTER — Ambulatory Visit (INDEPENDENT_AMBULATORY_CARE_PROVIDER_SITE_OTHER)
Admission: RE | Admit: 2014-01-13 | Discharge: 2014-01-13 | Disposition: A | Discharge status: Home / Self Care | Payer: BC Managed Care – PPO | Source: Ambulatory Visit | Attending: Family Medicine | Admitting: Family Medicine

## 2014-01-13 VITALS — BP 120/80 | Temp 98.5°F | Wt 270.0 lb

## 2014-01-13 DIAGNOSIS — G8929 Other chronic pain: Secondary | ICD-10-CM | POA: Insufficient documentation

## 2014-01-13 DIAGNOSIS — Z23 Encounter for immunization: Secondary | ICD-10-CM

## 2014-01-13 DIAGNOSIS — R1031 Right lower quadrant pain: Secondary | ICD-10-CM

## 2014-01-13 LAB — POCT URINALYSIS DIPSTICK
Bilirubin, UA: NEGATIVE
Blood, UA: NEGATIVE
Glucose, UA: NEGATIVE
Ketones, UA: NEGATIVE
Leukocytes, UA: NEGATIVE
Nitrite, UA: NEGATIVE
Protein, UA: NEGATIVE
SPEC GRAV UA: 1.01
UROBILINOGEN UA: 0.2
pH, UA: 6

## 2014-01-13 MED ORDER — IOHEXOL 300 MG/ML  SOLN
100.0000 mL | Freq: Once | INTRAMUSCULAR | Status: AC | PRN
  Administered 2014-01-13: 100 mL via INTRAVENOUS

## 2014-01-13 NOTE — Progress Notes (Signed)
Pre visit review using our clinic review tool, if applicable. No additional management support is needed unless otherwise documented below in the visit note. 

## 2014-01-13 NOTE — Patient Instructions (Signed)
Full liquid diet no food  Bedrest at home for 2 days  Percocet via Dr. Alfonso Ramus........... one half tab every 6-8 hours for pain  We will set you up a scan of your abdomen ASAP to try to determine the cause of your abdominal pain

## 2014-01-13 NOTE — Progress Notes (Signed)
   Subjective:    Patient ID: Tony [name], male    [name]: 1975-08-03, 38 y.o.   MRN: [id number]  HPI Tony [name] is a 38 year old single male who comes in today for evaluation of abdominal pain  He states on November 16 around 9 PM he developed a sudden onset of severe right lower quadrant abdominal pain. He says his pain on a scale of 1-10 was a 10. It was so severe he called his mother who is a Marine scientist. She came over to see him. By the time she got there the pain diminished but is not gone. He had one episode of nausea but no fever and no vomiting. Since that time he's been tender in that area but is been able to eat and no fever or vomiting or diarrhea.  He went to [facility]....... and was seen by their family doctor Dr. Alfonso Ramus. That visit was on November 13. That day he woke up at 38 and was severe right lower quadrant back pain. He went to see them x-ray shows some degenerative disc disease he was told by Dr. Alfonso Ramus to take some pain medication he gave him some starts to steroids and it did help. On the 17th he went back to see them and get a prescription for Percocet. He's been on a stool softener since that time has had 2-3 bowel movements daily and is not constipated.   Review of Systems Review of systems otherwise negative    Objective:   Physical Exam  Well-developed overweight male 270 pounds!!!!!!!!!!......... no acute distress vital signs stable he is afebrile examination the abdomen the abdomen is very large but bowel sounds are normal. There is tenderness in the right lower quadrant and he says it hurts worse while ago although he has no visible sign of rebound tenderness.  Urinalysis normal      Assessment & Plan:  Right lower quadrant abdominal pain,,,,,,,,,,,,,,,,,, abdominal CT scan with oral contrast rule out appendicitis,

## 2014-01-14 ENCOUNTER — Inpatient Hospital Stay: Admission: RE | Admit: 2014-01-14 | Payer: BC Managed Care – PPO | Source: Ambulatory Visit

## 2014-01-14 ENCOUNTER — Telehealth: Payer: Self-pay | Admitting: Family Medicine

## 2014-01-14 ENCOUNTER — Other Ambulatory Visit: Payer: Self-pay | Admitting: *Deleted

## 2014-01-14 DIAGNOSIS — R1031 Right lower quadrant pain: Principal | ICD-10-CM

## 2014-01-14 DIAGNOSIS — G8929 Other chronic pain: Secondary | ICD-10-CM

## 2014-01-14 NOTE — Telephone Encounter (Signed)
Spoke with patient.  Released [place] results.  GI referral placed.

## 2014-01-14 NOTE — Telephone Encounter (Signed)
Pt would like a cb asap concerning the next step. Pt wanted to [facility] if he needs to get labs. Pt states he forgot to tell dr todd he also has loose stools. One night he did feel cold, a chill. And his right testicle feels like someone is squeezing it, achey. Left one does not. Pt is very tired.  Pt is anxious to get this resolved bc he is leaving on vacation next Thursday.  Pt is fasting today and wants to know if he should eat. Pt would like a cb.

## 2014-01-28 ENCOUNTER — Encounter: Payer: Self-pay | Admitting: Gastroenterology

## 2014-02-01 ENCOUNTER — Encounter: Payer: Self-pay | Admitting: Physician Assistant

## 2014-02-01 ENCOUNTER — Ambulatory Visit (INDEPENDENT_AMBULATORY_CARE_PROVIDER_SITE_OTHER): Payer: BC Managed Care – PPO | Admitting: Physician Assistant

## 2014-02-01 VITALS — BP 142/84 | HR 76 | Ht 69.5 in | Wt 265.0 lb

## 2014-02-01 DIAGNOSIS — K625 Hemorrhage of anus and rectum: Secondary | ICD-10-CM

## 2014-02-01 NOTE — Progress Notes (Signed)
Patient ID: Tony [name], male   [name]: 09/30/1975, 38 y.o.   MRN: [id number]    HPI:   Ramey is a 38 year old male referred for evaluation by Dr. Sherren Mocha due to rectal bleeding.  Meshach states that on November 16 in the evening he developed sudden onset severe right lower quadrant abdominal pain that radiated into the low back. His pain came on out of the blue. He says his pain was a 10 on a scale of 1-10 his pain was associated with an episode of nausea but no vomiting and no diarrhea. He went to his family physician and had x-rays that showed degenerative disc disease he was advised to use some pain medication which helped. His pain lasted for approximately an hour. He has had no recurrent abdominal pain since then. He does report that over the past year or so he has had multiple episodes of blood on the toilet tissue. He reports that he had an anal fissure in the past but he is not having any pain at this time he does occasionally have some rectal itching at night. He has no sensation of incomplete evacuation. He denies anorexia or weight loss. He is no family history of colon cancer, colon polyps or inflammatory bowel disease. He reports that he has had numerous episodes of blood on the toilet tissue and some blood striping on the stools. He recently had a friend who was in his mid 56s and passed away from colon cancer and he is very interested in pursuing a colonoscopy to evaluate for any intraluminal pathology.     Past Medical History  Diagnosis Date  . Psoriasis   . Anxiety   . HTN (hypertension)   . Insomnia     Past Surgical History  Procedure Laterality Date  . Right ankle      2014   Family History  Problem Relation Age of Onset  . Diabetes    . Heart disease     History  Substance Use Topics  . Smoking status: Never Smoker   . Smokeless tobacco: Never Used  . Alcohol Use: 0.0 oz/week    0 Not specified per week   Current Outpatient Prescriptions  Medication Sig Dispense  Refill  . calcipotriene (DOVONOX) 0.005 % cream Apply to affected area  twice daily 120 g 2  . clonazePAM (KLONOPIN) 0.5 MG tablet One tablet 3 times daily 100 tablet 5  . Eszopiclone (ESZOPICLONE) 3 MG TABS Take 3 mg by mouth at bedtime. As needed    . lisinopril (PRINIVIL,ZESTRIL) 10 MG tablet TAKE ONE TABLET BY MOUTH ONE TIME DAILY  90 tablet 1  . oxyCODONE-acetaminophen (PERCOCET) 10-325 MG per tablet Take 1 tablet by mouth every 6 (six) hours as needed for pain.     No current facility-administered medications for this visit.   No Known Allergies   Review of Systems: Gen: Denies any fever, chills, sweats, anorexia, fatigue, weakness, malaise, weight loss, and sleep disorder CV: Denies chest pain, angina, palpitations, syncope, orthopnea, PND, peripheral edema, and claudication. Resp: Denies dyspnea at rest, dyspnea with exercise, cough, sputum, wheezing, coughing up blood, and pleurisy. GI: Denies vomiting blood, jaundice, and fecal incontinence.   Denies dysphagia or odynophagia. GU : Denies urinary burning, blood in urine, urinary frequency, urinary hesitancy, nocturnal urination, and urinary incontinence. MS: Denies joint pain, limitation of movement, and swelling, stiffness, low back pain, extremity pain. Denies muscle weakness, cramps, atrophy.  Derm: Denies rash, itching, dry skin, hives, moles, warts, or unhealing  ulcers.  Psych: Denies depression, anxiety, memory loss, suicidal ideation, hallucinations, paranoia, and confusion. Heme: Denies bruising, bleeding, and enlarged lymph nodes. Neuro:  Denies any headaches, dizziness, paresthesias. Endo:  Denies any problems with DM, thyroid, adrenal function  Studies: Ct Abdomen Pelvis W Contrast  01/13/2014   CLINICAL DATA:  Acute right lower quadrant pain for 1 week. Right flank pain. Associated nausea.  EXAM: CT ABDOMEN AND PELVIS WITH CONTRAST  TECHNIQUE: Multidetector CT imaging of the abdomen and pelvis was performed using the  standard protocol following bolus administration of intravenous contrast.  CONTRAST:  124m OMNIPAQUE IOHEXOL 300 MG/ML  SOLN  COMPARISON:  None.  FINDINGS: Lower chest: Clear lung bases. Prominent heart size without pericardial effusion. No pleural effusion. Negative for hiatal hernia.  Abdomen: Diffuse hypoattenuation of the liver parenchyma compatible with fatty infiltration or hepatic steatosis. Focal fatty sparing noted along the gallbladder fossa. Hepatic and portal veins are patent. No biliary dilatation. The gallbladder, biliary system, pancreas, spleen, adrenal glands, and kidneys are within normal limits for age and demonstrate no acute process.  Negative for bowel obstruction, dilatation, ileus, or free air.  No abdominal free fluid, fluid collection, hemorrhage, abscess, or adenopathy.  Negative for aneurysm or acute vascular finding.  Normal appendix demonstrated.  Pelvis: Urinary bladder moderately distended. No pelvic free fluid, fluid collection, hemorrhage, abscess, adenopathy, or inguinal abnormality. Small fat containing left inguinal hernia noted. No acute distal bowel process.  Diffuse degenerative changes of the spine, most pronounced at L5-S1 with vacuum disc phenomena.  IMPRESSION: Hepatic steatosis.  No acute intra-abdominal or pelvic finding  Small left inguinal fat containing hernia   Electronically Signed   By: TDaryll BrodM.D.   On: 01/13/2014 16:15      Physical Exam: BP 142/84 mmHg  Pulse 76  Ht 5' 9.5" (1.765 m)  Wt 265 lb (120.203 kg)  BMI 38.59 kg/m2 Constitutional: Pleasant,well-developed, male in no acute distress. HEENT: Normocephalic and atraumatic. Conjunctivae are normal. No scleral icterus. Neck supple.  Cardiovascular: Normal rate, regular rhythm.  Pulmonary/chest: Effort normal and breath sounds normal. No wheezing, rales or rhonchi. Abdominal: Soft, nondistended, nontender. Bowel sounds active throughout. There are no masses palpable. No  hepatomegaly. Extremities: no edema Lymphadenopathy: No cervical adenopathy noted. Neurological: Alert and oriented to person place and time. Skin: Skin is warm and dry. No rashes noted. Psychiatric: Normal mood and affect. Behavior is normal.  ASSESSMENT AND PLAN: 38year old male with a history of intermittent rectal bleeding status post an episode of right lower quadrant abdominal pain referred for evaluation. He will be scheduled for colonoscopy to evaluate for polyps, neoplasia, or inflammatory process. The risks, benefits, and alternatives to colonoscopy with possible biopsy and possible polypectomy were discussed with the patient and they consent to proceed. The procedure will be scheduled with Dr. GCarlean Purl    Jarad Barth, LVita BarleyPA-C 02/01/2014, 11:48 AM

## 2014-02-01 NOTE — Patient Instructions (Signed)

## 2014-02-02 ENCOUNTER — Other Ambulatory Visit: Payer: Self-pay | Admitting: Family Medicine

## 2014-02-03 ENCOUNTER — Encounter: Payer: Self-pay | Admitting: Internal Medicine

## 2014-02-03 ENCOUNTER — Ambulatory Visit (AMBULATORY_SURGERY_CENTER): Payer: BC Managed Care – PPO | Admitting: Internal Medicine

## 2014-02-03 VITALS — BP 138/89 | HR 74 | Temp 98.9°F | Resp 20 | Ht 69.5 in | Wt 265.0 lb

## 2014-02-03 DIAGNOSIS — K602 Anal fissure, unspecified: Secondary | ICD-10-CM | POA: Insufficient documentation

## 2014-02-03 DIAGNOSIS — D120 Benign neoplasm of cecum: Secondary | ICD-10-CM

## 2014-02-03 DIAGNOSIS — K625 Hemorrhage of anus and rectum: Secondary | ICD-10-CM

## 2014-02-03 HISTORY — PX: COLONOSCOPY W/ BIOPSIES: SHX1374

## 2014-02-03 LAB — HM COLONOSCOPY

## 2014-02-03 MED ORDER — DILTIAZEM GEL 2 %
1.0000 | Freq: Three times a day (TID) | CUTANEOUS | Status: DC
Start: 2014-02-03 — End: 2017-05-07

## 2014-02-03 MED ORDER — SODIUM CHLORIDE 0.9 % IV SOLN: 500.0000 mL | INTRAVENOUS | Status: DC

## 2014-02-03 NOTE — Progress Notes (Signed)
Called to room to assist during endoscopic procedure.  Patient ID and intended procedure confirmed with present staff. Received instructions for my participation in the procedure from the performing physician.  

## 2014-02-03 NOTE — Patient Instructions (Addendum)
I found and removed a small polyp that looks benign. I do not think it has had anything to do with your symptoms. You have an anal fissure. This is probably where the bleeding is coming from. You may also have hemorrhoid problems - not always easy to tell at a colonoscopy.  1) Please take Benefiber 2 tablespoons daily - this will keep stools softer and hopefully improve the fissure. Since you have pain from the fissure I will prescribe a medication to apply to the anal canal and treat the fissure. It is called diltiazem gel - it needs to be inserted by finger into the anal canal up to the first knuckle. 2) Call soon and schedule an appointment to see me in the office for January or February to reassess.  I will let you know pathology results and when to have another routine colonoscopy by mail.  I appreciate the opportunity to care for you. Gatha Mayer, MD, FACG   YOU HAD AN ENDOSCOPIC PROCEDURE TODAY AT [facility]: Refer to the procedure report that was given to you for any specific questions about what was found during the examination.  If the procedure report does not answer your questions, please call your gastroenterologist to clarify.  If you requested that your care partner not be given the details of your procedure findings, then the procedure report has been included in a sealed envelope for you to review at your convenience later.  YOU SHOULD EXPECT: Some feelings of bloating in the abdomen. Passage of more gas than usual.  Walking can help get rid of the air that was put into your GI tract during the procedure and reduce the bloating. If you had a lower endoscopy (such as a colonoscopy or flexible sigmoidoscopy) you may notice spotting of blood in your stool or on the toilet paper. If you underwent a bowel prep for your procedure, then you may not have a normal bowel movement for a few days.  DIET: Your first meal following the procedure should be a light  meal and then it is ok to progress to your normal diet.  A half-sandwich or bowl of soup is an example of a good first meal.  Heavy or fried foods are harder to digest and may make you feel nauseous or bloated.  Likewise meals heavy in dairy and vegetables can cause extra gas to form and this can also increase the bloating.  Drink plenty of fluids but you should avoid alcoholic beverages for 24 hours.  ACTIVITY: Your care partner should take you home directly after the procedure.  You should plan to take it easy, moving slowly for the rest of the day.  You can resume normal activity the day after the procedure however you should NOT DRIVE or use heavy machinery for 24 hours (because of the sedation medicines used during the test).    SYMPTOMS TO REPORT IMMEDIATELY: A gastroenterologist can be reached at any hour.  During normal business hours, 8:30 AM to 5:00 PM Monday through Friday, call [phone].  After hours and on weekends, please call the GI answering service at [phone] who will take a message and have the physician on call contact you.   Following lower endoscopy (colonoscopy or flexible sigmoidoscopy):  Excessive amounts of blood in the stool  Significant tenderness or worsening of abdominal pains  Swelling of the abdomen that is new, acute  Fever of 100F or higher  FOLLOW UP: If any biopsies were  taken you will be contacted by phone or by letter within the next 1-3 weeks.  Call your gastroenterologist if you have not heard about the biopsies in 3 weeks.  Our staff will call the home number listed on your records the next business day following your procedure to check on you and address any questions or concerns that you may have at that time regarding the information given to you following your procedure. This is a courtesy call and so if there is no answer at the home number and we have not heard from you through the emergency physician on call, we will assume that you have  returned to your regular daily activities without incident.  SIGNATURES/CONFIDENTIALITY: You and/or your care partner have signed paperwork which will be entered into your electronic medical record.  These signatures attest to the fact that that the information above on your After Visit Summary has been reviewed and is understood.  Full responsibility of the confidentiality of this discharge information lies with you and/or your care-partner.

## 2014-02-03 NOTE — Op Note (Signed)
[facility]  [address] [place], [phone]   COLONOSCOPY PROCEDURE REPORT  PATIENT: Tony [name], Tony [name]  MR#: [id number] BIRTHDATE: 1975-06-24 , 38  yrs. old GENDER: male ENDOSCOPIST: Gatha Mayer, MD, [facility] PROCEDURE DATE:  02/03/2014 PROCEDURE:   Colonoscopy with biopsy First Screening Colonoscopy - Avg.  risk and is 50 yrs.  old or older - No.  Prior Negative Screening - Now for repeat screening. N/A  History of Adenoma - Now for follow-up colonoscopy & has been > or = to 3 yrs.  N/A  Polyps Removed Today? Yes. ASA CLASS:   Class II INDICATIONS:rectal bleeding. MEDICATIONS: Propofol 300 mg IV and Monitored anesthesia care  DESCRIPTION OF PROCEDURE:   After the risks benefits and alternatives of the procedure were thoroughly explained, informed consent was obtained.  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   The LB [id number]  endoscope was introduced through the anus and advanced to the cecum, which was identified by both the appendix and ileocecal valve. No adverse events experienced.   The quality of the prep was excellent, using MiraLax The instrument was then slowly withdrawn as the colon was fully examined.   COLON FINDINGS: A sessile polyp measuring 3 mm in size was found at the cecum.  A polypectomy was performed with cold forceps.  The resection was complete, the polyp tissue was completely retrieved and sent to histology.   The examination was otherwise normal. The examined terminal ileum appeared to be normal.  Retroflexed views revealed an anal fissure. The time to cecum=1 minutes 38 seconds.  Withdrawal time=9 minutes 31 seconds.  The scope was withdrawn and the procedure completed. COMPLICATIONS: There were no immediate complications.  ENDOSCOPIC IMPRESSION: 1.   Sessile polyp was found at the cecum; polypectomy was performed with cold forceps 2.   The colon examination was otherwise normal 3.   The examined terminal  ileum appeared to be normal 4.    posterior anal fissure seen and palpated RECOMMENDATIONS: Benefiber 2 tbsp daily Diltiazem gel if having pain from fissure (not) Await polyp pathology He is to call and get a Jan/Feb appt with me  eSigned:  Gatha Mayer, MD, [facility] 02/03/2014 4:08 PM   cc: Christie Nottingham, MD and The Patient

## 2014-02-03 NOTE — Progress Notes (Signed)
Report to PACU, RN, vss, BBS= Clear.  

## 2014-02-03 NOTE — Progress Notes (Signed)
Agree w/ Ms. Hvozdovic's note and mangement.

## 2014-02-04 ENCOUNTER — Telehealth: Payer: Self-pay | Admitting: *Deleted

## 2014-02-04 NOTE — Telephone Encounter (Signed)
  Follow up Call-  Call back number 02/03/2014  Post procedure Call Back phone  # [phone]  Permission to leave phone message Yes     Patient questions:  Do you have a fever, pain , or abdominal swelling? No. Pain Score  0 *  Have you tolerated food without any problems? Yes.    Have you been able to return to your normal activities? Yes.    Do you have any questions about your discharge instructions: Diet   No. Medications  No. Follow up visit  No.  Do you have questions or concerns about your Care? No.  Actions: * If pain score is 4 or above: No action needed, pain <4.

## 2014-02-11 ENCOUNTER — Encounter: Payer: Self-pay | Admitting: Internal Medicine

## 2014-02-11 DIAGNOSIS — Z860100 Personal history of colon polyps, unspecified: Secondary | ICD-10-CM

## 2014-02-11 HISTORY — DX: Personal history of colonic polyps: Z86.010

## 2014-02-11 HISTORY — DX: Personal history of colon polyps, unspecified: Z86.0100

## 2014-02-11 NOTE — Progress Notes (Signed)
Quick Note:  [facility] - repeat colon 2020/1 ______

## 2014-02-23 ENCOUNTER — Telehealth: Payer: Self-pay | Admitting: Family Medicine

## 2014-02-23 NOTE — Telephone Encounter (Signed)
Pt would like to speak w/ dr todd personally.  Pt really would like to thank him for insisting on colonoscopy. They did find a polyp and it ws pre-cancer.

## 2014-02-24 NOTE — Telephone Encounter (Signed)
Spoke with patient.

## 2014-04-18 ENCOUNTER — Telehealth: Payer: Self-pay | Admitting: Family Medicine

## 2014-04-18 DIAGNOSIS — Z86010 Personal history of colonic polyps: Secondary | ICD-10-CM

## 2014-04-18 NOTE — Telephone Encounter (Signed)
Pt has a fup with Dr Carlean Purl on 04/19/14 and need a referral

## 2014-04-19 ENCOUNTER — Ambulatory Visit (INDEPENDENT_AMBULATORY_CARE_PROVIDER_SITE_OTHER): Payer: 59 | Admitting: Internal Medicine

## 2014-04-19 ENCOUNTER — Encounter: Payer: Self-pay | Admitting: Internal Medicine

## 2014-04-19 VITALS — BP 120/68 | HR 78 | Resp 14 | Ht 69.5 in | Wt 262.0 lb

## 2014-04-19 DIAGNOSIS — K602 Anal fissure, unspecified: Secondary | ICD-10-CM

## 2014-04-19 DIAGNOSIS — Z86010 Personal history of colonic polyps: Secondary | ICD-10-CM

## 2014-04-19 DIAGNOSIS — L309 Dermatitis, unspecified: Secondary | ICD-10-CM

## 2014-04-19 MED ORDER — NYSTATIN-TRIAMCINOLONE 100000-0.1 UNIT/GM-% EX OINT: 1.0000 "application " | TOPICAL_OINTMENT | Freq: Two times a day (BID) | CUTANEOUS | Status: DC

## 2014-04-19 NOTE — Patient Instructions (Signed)
We have sent the following medications to your pharmacy for you to pick up at your convenience: Nystatin ointment   Try and wipe less aggressively and use a hair dryer to dry with when possible.  Follow up with Dr Carlean Purl as needed.   I appreciate the opportunity to care for you.

## 2014-04-19 NOTE — Assessment & Plan Note (Signed)
Not symptomatic

## 2014-04-19 NOTE — Assessment & Plan Note (Signed)
Nystatin - triamcinolone dont think this is psoriasis Less aggressive wiping - use hair dryer

## 2014-04-19 NOTE — Assessment & Plan Note (Signed)
Repeat colonoscopy 2020/2021

## 2014-04-20 NOTE — Progress Notes (Signed)
   Subjective:    Patient ID: Tony [name], male    [name]: [birth year], 39 y.o.   MRN: [id number] Complaint is follow-up of rectal fissure and rectal bleeding HPI The patient is a pleasant middle-aged man who had a colonoscopy in December, a small sessile serrated polyp was removed. 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 He actually feels well at this time. There is no bleeding trouble. He does have anal itching however. Medications, allergies, past medical history, past surgical history, family history and social history are reviewed and updated in the EMR.   Review of Systems As per history of present illness    Objective:   Physical Exam BP 120/68 mmHg  Pulse 78  Resp 14  Ht 5' 9.5" (1.765 m)  Wt 262 lb (118.842 kg)  BMI 38.15 kg/m2 Section of the anal area shows an erythematous scaly rash around the anus.       Assessment & Plan:   1. Perianal dermatitis   2. Hx of colonic polyp - ssp   3. Anal fissure - posterior    We'll treat with nystatin triamcinolone. He does have a history of psoriasis, it's possible that is going on here but I suspect this is more of a perianal dermatitis. I have recommended he use an antifungal powder as well. He perspires a lot in this area. Anal hygiene is recommended including using a hair dryer to dry the area. He will see me as needed.

## 2014-05-31 ENCOUNTER — Ambulatory Visit (HOSPITAL_BASED_OUTPATIENT_CLINIC_OR_DEPARTMENT_OTHER)
Admission: RE | Admit: 2014-05-31 | Discharge: 2014-05-31 | Disposition: A | Discharge status: Home / Self Care | Payer: 59 | Source: Ambulatory Visit | Attending: Nurse Practitioner | Admitting: Nurse Practitioner

## 2014-05-31 ENCOUNTER — Ambulatory Visit (INDEPENDENT_AMBULATORY_CARE_PROVIDER_SITE_OTHER): Payer: 59 | Admitting: Nurse Practitioner

## 2014-05-31 ENCOUNTER — Other Ambulatory Visit: Payer: Self-pay | Admitting: Family Medicine

## 2014-05-31 ENCOUNTER — Telehealth: Payer: Self-pay | Admitting: Nurse Practitioner

## 2014-05-31 ENCOUNTER — Encounter: Payer: Self-pay | Admitting: Nurse Practitioner

## 2014-05-31 VITALS — BP 126/81 | HR 86 | Temp 97.9°F | Ht 69.5 in | Wt 255.0 lb

## 2014-05-31 DIAGNOSIS — R0989 Other specified symptoms and signs involving the circulatory and respiratory systems: Secondary | ICD-10-CM | POA: Insufficient documentation

## 2014-05-31 DIAGNOSIS — R059 Cough, unspecified: Secondary | ICD-10-CM

## 2014-05-31 DIAGNOSIS — R05 Cough: Secondary | ICD-10-CM | POA: Diagnosis not present

## 2014-05-31 DIAGNOSIS — R509 Fever, unspecified: Secondary | ICD-10-CM | POA: Insufficient documentation

## 2014-05-31 DIAGNOSIS — J209 Acute bronchitis, unspecified: Secondary | ICD-10-CM

## 2014-05-31 MED ORDER — BENZONATATE 200 MG PO CAPS: 200.0000 mg | ORAL_CAPSULE | Freq: Three times a day (TID) | ORAL | Status: DC | PRN

## 2014-05-31 MED ORDER — AZITHROMYCIN 250 MG PO TABS: ORAL_TABLET | ORAL | Status: DC

## 2014-05-31 MED ORDER — GUAIFENESIN ER 600 MG PO TB12: 600.0000 mg | ORAL_TABLET | Freq: Two times a day (BID) | ORAL | Status: DC

## 2014-05-31 NOTE — Progress Notes (Signed)
   Subjective:    Patient ID: Tony [name], male    [name]: 01-Sep-1975, 39 y.o.   MRN: [id number]  Cough This is a new problem. The current episode started in the past 7 days. The problem has been gradually worsening. The problem occurs hourly. The cough is non-productive. Associated symptoms include chest pain (sternum), a fever, a sore throat and shortness of breath (with walking few steps). Pertinent negatives include no chills, ear congestion, ear pain, headaches, hemoptysis, nasal congestion or wheezing. Nothing aggravates the symptoms. He has tried nothing for the symptoms. His past medical history is significant for pneumonia (pt reports walking pneumonia 15 yrs ago, fears he has it again).      Review of Systems  Constitutional: Positive for fever. Negative for chills.  HENT: Positive for sore throat. Negative for ear pain.   Respiratory: Positive for cough and shortness of breath (with walking few steps). Negative for hemoptysis and wheezing.   Cardiovascular: Positive for chest pain (sternum).  Neurological: Negative for headaches.       Objective:   Physical Exam  Constitutional: He is oriented to person, place, and time. He appears well-developed and well-nourished. No distress.  Appears nervous  HENT:  Head: Normocephalic and atraumatic.  Right Ear: External ear normal.  Left Ear: External ear normal.  Mouth/Throat: Oropharynx is clear and moist. No oropharyngeal exudate.  Eyes: Conjunctivae are normal. Right eye exhibits no discharge. Left eye exhibits no discharge.  Neck: Normal range of motion. Neck supple. No thyromegaly present.  Cardiovascular: Normal rate, regular rhythm and normal heart sounds.   No murmur heard. Pulmonary/Chest: Effort normal and breath sounds normal. No respiratory distress. He has no wheezes. He has no rales.  Lymphadenopathy:    He has no cervical adenopathy.  Neurological: He is alert and oriented to person, place, and time.  Skin: Skin is  warm and dry.  Psychiatric: He has a normal mood and affect. His behavior is normal. Thought content normal.  Vitals reviewed.         Assessment & Plan:  1. Cough Likely viral Will r/o pneumonia w/CXR Symptom support See pt instructions - DG Chest 2 View; Future F/u PRN

## 2014-05-31 NOTE — Telephone Encounter (Signed)
LMOVM for patient to return call concerning lab results.  

## 2014-05-31 NOTE — Telephone Encounter (Signed)
Patient called back and I informed him of results.

## 2014-05-31 NOTE — Telephone Encounter (Signed)
pls call pt: Advise CXR indicates he has bronchitis. I will prescribe antibiotic. He should start today. Also start mucinex take as directed-sent in script. Take benzonatate capsules for cough during day. Delsym at night.

## 2014-05-31 NOTE — Progress Notes (Signed)
Pre visit review using our clinic review tool, if applicable. No additional management support is needed unless otherwise documented below in the visit note. 

## 2014-05-31 NOTE — Patient Instructions (Signed)
If this is viral illness, it will take 7 to 10 days to resolve.  Please get chest xray. I will call with results & start antibiotic if indicated.  Rest, stay hydrated-sip fluids every hour. Use delsym at night if cough is keeping you awake. Rest.

## 2014-09-30 ENCOUNTER — Telehealth: Payer: Self-pay | Admitting: Nurse Practitioner

## 2014-09-30 ENCOUNTER — Telehealth: Payer: Self-pay | Admitting: Family Medicine

## 2014-09-30 NOTE — Telephone Encounter (Signed)
If patient has lower back pain, he probably should use caution if playing soccer. I would not advised him to take a Percocet in order to play soccer, or playing soccer when he's taken a Percocet. I have never evaluated him so I am certain of the severity of his condition.

## 2014-09-30 NOTE — Telephone Encounter (Signed)
Can you please advise?

## 2014-09-30 NOTE — Telephone Encounter (Signed)
Patient's game got canceled, but I did advise him that it wouldn't be recommended to take percocet in order to play soccer.

## 2014-09-30 NOTE — Telephone Encounter (Signed)
Patient is taking pain medication for his back. He has a soccer [employer] that he wants to try to play in if his back isn't hurting too bad. Patient is concerned about exercising while taking these medications. Please call patient and advise.

## 2014-11-30 ENCOUNTER — Other Ambulatory Visit: Payer: Self-pay | Admitting: Family Medicine

## 2014-12-06 ENCOUNTER — Other Ambulatory Visit: Payer: Self-pay | Admitting: Family Medicine

## 2014-12-06 MED ORDER — LISINOPRIL 10 MG PO TABS: 10.0000 mg | ORAL_TABLET | Freq: Every day | ORAL | Status: DC

## 2014-12-06 NOTE — Telephone Encounter (Signed)
RF request for lisinopril LOV: 05/31/14 acute visit seen Layne Next ov: 12/22/14 new pt visit to establish with Dr. Anitra Lauth Last written: 05/31/14 #90 w/ 1RF  Please advise. Thanks.

## 2014-12-06 NOTE — Telephone Encounter (Signed)
Pt was previously a pt of Dr. Sherren Mocha. He then changed to [facility]. He now has an appt for Oct 27 to est with Dr. Anitra Lauth. He is requesting a refill on his Lisinaprol 10 mg to hold him until his Oct 27 appt. [facility] [address] [facility] in [place]

## 2014-12-22 ENCOUNTER — Ambulatory Visit: Payer: 59 | Admitting: Family Medicine

## 2015-01-02 ENCOUNTER — Ambulatory Visit: Payer: 59 | Admitting: Family Medicine

## 2015-01-31 ENCOUNTER — Encounter: Payer: Self-pay | Admitting: Family Medicine

## 2015-01-31 ENCOUNTER — Ambulatory Visit: Payer: 59 | Admitting: Family Medicine

## 2015-01-31 DIAGNOSIS — Z0289 Encounter for other administrative examinations: Secondary | ICD-10-CM

## 2015-02-24 ENCOUNTER — Emergency Department (HOSPITAL_BASED_OUTPATIENT_CLINIC_OR_DEPARTMENT_OTHER): Payer: Self-pay

## 2015-02-24 ENCOUNTER — Encounter (HOSPITAL_BASED_OUTPATIENT_CLINIC_OR_DEPARTMENT_OTHER): Payer: Self-pay | Admitting: *Deleted

## 2015-02-24 ENCOUNTER — Observation Stay (HOSPITAL_BASED_OUTPATIENT_CLINIC_OR_DEPARTMENT_OTHER)
Admission: EM | Admit: 2015-02-24 | Discharge: 2015-02-25 | Disposition: A | Discharge status: Other Acute Inpatient Hospital | Payer: Self-pay | Attending: Surgery | Admitting: Surgery

## 2015-02-24 ENCOUNTER — Encounter (HOSPITAL_COMMUNITY)
Admission: EM | Disposition: A | Discharge status: Other Acute Inpatient Hospital | Payer: Self-pay | Source: Home / Self Care | Attending: Emergency Medicine

## 2015-02-24 ENCOUNTER — Observation Stay (HOSPITAL_COMMUNITY): Payer: Self-pay | Admitting: Anesthesiology

## 2015-02-24 DIAGNOSIS — R109 Unspecified abdominal pain: Secondary | ICD-10-CM

## 2015-02-24 DIAGNOSIS — K3580 Unspecified acute appendicitis: Principal | ICD-10-CM | POA: Diagnosis present

## 2015-02-24 DIAGNOSIS — F411 Generalized anxiety disorder: Secondary | ICD-10-CM

## 2015-02-24 HISTORY — DX: Personal history of urinary calculi: Z87.442

## 2015-02-24 HISTORY — PX: LAPAROSCOPIC APPENDECTOMY: SHX408

## 2015-02-24 LAB — CBC WITH DIFFERENTIAL/PLATELET
BASOS ABS: 0 10*3/uL (ref 0.0–0.1)
Basophils Relative: 0 %
EOS ABS: 0.1 10*3/uL (ref 0.0–0.7)
EOS PCT: 1 %
HCT: 45 % (ref 39.0–52.0)
Hemoglobin: 15.3 g/dL (ref 13.0–17.0)
LYMPHS ABS: 1 10*3/uL (ref 0.7–4.0)
LYMPHS PCT: 7 %
MCH: 31.5 pg (ref 26.0–34.0)
MCHC: 34 g/dL (ref 30.0–36.0)
MCV: 92.6 fL (ref 78.0–100.0)
MONO ABS: 0.9 10*3/uL (ref 0.1–1.0)
Monocytes Relative: 6 %
Neutro Abs: 13 10*3/uL — ABNORMAL HIGH (ref 1.7–7.7)
Neutrophils Relative %: 86 %
PLATELETS: 182 10*3/uL (ref 150–400)
RBC: 4.86 MIL/uL (ref 4.22–5.81)
RDW: 12 % (ref 11.5–15.5)
WBC: 15 10*3/uL — AB (ref 4.0–10.5)

## 2015-02-24 LAB — BASIC METABOLIC PANEL
Anion gap: 8 (ref 5–15)
BUN: 12 mg/dL (ref 6–20)
CALCIUM: 9.4 mg/dL (ref 8.9–10.3)
CO2: 22 mmol/L (ref 22–32)
Chloride: 102 mmol/L (ref 101–111)
Creatinine, Ser: 1.04 mg/dL (ref 0.61–1.24)
GFR calc Af Amer: >60 mL/min (ref >60)
GLUCOSE: 174 mg/dL — AB (ref 65–99)
POTASSIUM: 4.1 mmol/L (ref 3.5–5.1)
SODIUM: 132 mmol/L — AB (ref 135–145)

## 2015-02-24 LAB — URINALYSIS, ROUTINE W REFLEX MICROSCOPIC
BILIRUBIN URINE: NEGATIVE
GLUCOSE, UA: NEGATIVE mg/dL
HGB URINE DIPSTICK: NEGATIVE
KETONES UR: NEGATIVE mg/dL
Leukocytes, UA: NEGATIVE
Nitrite: NEGATIVE
PROTEIN: NEGATIVE mg/dL
Specific Gravity, Urine: 1.01 (ref 1.005–1.030)
pH: 5 (ref 5.0–8.0)

## 2015-02-24 LAB — GLUCOSE, CAPILLARY: Glucose-Capillary: 109 mg/dL — ABNORMAL HIGH (ref 65–99)

## 2015-02-24 SURGERY — APPENDECTOMY, LAPAROSCOPIC
Anesthesia: General

## 2015-02-24 MED ORDER — LIDOCAINE HCL (CARDIAC) 20 MG/ML IV SOLN
INTRAVENOUS | Status: DC | PRN
  Administered 2015-02-24: 50 mg via INTRAVENOUS

## 2015-02-24 MED ORDER — ONDANSETRON HCL 4 MG/2ML IJ SOLN
4.0000 mg | Freq: Once | INTRAMUSCULAR | Status: AC
  Administered 2015-02-24: 4 mg via INTRAVENOUS
  Filled 2015-02-24: qty 2

## 2015-02-24 MED ORDER — FENTANYL CITRATE (PF) 250 MCG/5ML IJ SOLN
INTRAMUSCULAR | Status: AC
  Filled 2015-02-24: qty 5

## 2015-02-24 MED ORDER — ROCURONIUM BROMIDE 100 MG/10ML IV SOLN
INTRAVENOUS | Status: AC
  Filled 2015-02-24: qty 1

## 2015-02-24 MED ORDER — PHENYLEPHRINE 40 MCG/ML (10ML) SYRINGE FOR IV PUSH (FOR BLOOD PRESSURE SUPPORT)
PREFILLED_SYRINGE | INTRAVENOUS | Status: AC
  Filled 2015-02-24: qty 20

## 2015-02-24 MED ORDER — CEFTRIAXONE SODIUM 2 G IJ SOLR
INTRAMUSCULAR | Status: AC
Start: 2015-02-24 — End: 2015-02-24
  Filled 2015-02-24: qty 2

## 2015-02-24 MED ORDER — HYDROMORPHONE HCL 1 MG/ML IJ SOLN
1.0000 mg | Freq: Once | INTRAMUSCULAR | Status: AC
  Administered 2015-02-24: 1 mg via INTRAVENOUS
  Filled 2015-02-24: qty 1

## 2015-02-24 MED ORDER — SODIUM CHLORIDE 0.9 % IV SOLN: INTRAVENOUS | Status: DC

## 2015-02-24 MED ORDER — DEXTROSE 5 % IV SOLN
2.0000 g | Freq: Once | INTRAVENOUS | Status: AC
  Administered 2015-02-24: 2 g via INTRAVENOUS

## 2015-02-24 MED ORDER — ONDANSETRON HCL 4 MG/2ML IJ SOLN
4.0000 mg | Freq: Three times a day (TID) | INTRAMUSCULAR | Status: AC | PRN
  Administered 2015-02-24: 4 mg via INTRAVENOUS

## 2015-02-24 MED ORDER — PROPOFOL 10 MG/ML IV BOLUS
INTRAVENOUS | Status: AC
  Filled 2015-02-24: qty 20

## 2015-02-24 MED ORDER — HYDROMORPHONE HCL 1 MG/ML IJ SOLN
INTRAMUSCULAR | Status: AC
  Filled 2015-02-24: qty 1

## 2015-02-24 MED ORDER — HYDROCODONE-ACETAMINOPHEN 5-325 MG PO TABS
1.0000 | ORAL_TABLET | ORAL | Status: DC | PRN
  Administered 2015-02-24 – 2015-02-25 (×4): 2 via ORAL
  Filled 2015-02-24 (×4): qty 2

## 2015-02-24 MED ORDER — LISINOPRIL 10 MG PO TABS
10.0000 mg | ORAL_TABLET | Freq: Every day | ORAL | Status: DC
  Administered 2015-02-24: 10 mg via ORAL
  Filled 2015-02-24 (×2): qty 1

## 2015-02-24 MED ORDER — KCL IN DEXTROSE-NACL 20-5-0.45 MEQ/L-%-% IV SOLN
INTRAVENOUS | Status: DC
  Administered 2015-02-24: 13:00:00 via INTRAVENOUS
  Administered 2015-02-25: 1000 mL via INTRAVENOUS
  Filled 2015-02-24 (×4): qty 1000

## 2015-02-24 MED ORDER — SODIUM CHLORIDE 0.9 % IV SOLN
INTRAVENOUS | Status: DC
Start: 2015-02-24 — End: 2015-02-24
  Administered 2015-02-24: 1000 mL via INTRAVENOUS
  Administered 2015-02-24: 07:00:00 via INTRAVENOUS

## 2015-02-24 MED ORDER — INFLUENZA VAC SPLIT QUAD 0.5 ML IM SUSY
0.5000 mL | PREFILLED_SYRINGE | INTRAMUSCULAR | Status: AC
  Administered 2015-02-25: 0.5 mL via INTRAMUSCULAR
  Filled 2015-02-24 (×2): qty 0.5

## 2015-02-24 MED ORDER — ROCURONIUM BROMIDE 100 MG/10ML IV SOLN
INTRAVENOUS | Status: DC | PRN
  Administered 2015-02-24: 5 mg via INTRAVENOUS
  Administered 2015-02-24: 35 mg via INTRAVENOUS

## 2015-02-24 MED ORDER — ONDANSETRON HCL 4 MG/2ML IJ SOLN
INTRAMUSCULAR | Status: AC
  Filled 2015-02-24: qty 2

## 2015-02-24 MED ORDER — BUPIVACAINE-EPINEPHRINE 0.25% -1:200000 IJ SOLN
INTRAMUSCULAR | Status: DC | PRN
  Administered 2015-02-24: 30 mL

## 2015-02-24 MED ORDER — MIDAZOLAM HCL 5 MG/5ML IJ SOLN
INTRAMUSCULAR | Status: DC | PRN
  Administered 2015-02-24: 2 mg via INTRAVENOUS

## 2015-02-24 MED ORDER — METRONIDAZOLE IN NACL 5-0.79 MG/ML-% IV SOLN
500.0000 mg | Freq: Once | INTRAVENOUS | Status: AC
  Administered 2015-02-24: 500 mg via INTRAVENOUS
  Filled 2015-02-24: qty 100

## 2015-02-24 MED ORDER — HYDROMORPHONE HCL 1 MG/ML IJ SOLN
1.0000 mg | INTRAMUSCULAR | Status: DC | PRN
  Filled 2015-02-24: qty 1

## 2015-02-24 MED ORDER — DEXAMETHASONE SODIUM PHOSPHATE 10 MG/ML IJ SOLN
INTRAMUSCULAR | Status: DC | PRN
  Administered 2015-02-24: 10 mg via INTRAVENOUS

## 2015-02-24 MED ORDER — PNEUMOCOCCAL VAC POLYVALENT 25 MCG/0.5ML IJ INJ
0.5000 mL | INJECTION | INTRAMUSCULAR | Status: AC
  Administered 2015-02-25: 0.5 mL via INTRAMUSCULAR
  Filled 2015-02-24 (×2): qty 0.5

## 2015-02-24 MED ORDER — FENTANYL CITRATE (PF) 100 MCG/2ML IJ SOLN
INTRAMUSCULAR | Status: DC | PRN
  Administered 2015-02-24 (×2): 50 ug via INTRAVENOUS
  Administered 2015-02-24: 100 ug via INTRAVENOUS
  Administered 2015-02-24: 50 ug via INTRAVENOUS

## 2015-02-24 MED ORDER — MIDAZOLAM HCL 2 MG/2ML IJ SOLN
INTRAMUSCULAR | Status: AC
  Filled 2015-02-24: qty 2

## 2015-02-24 MED ORDER — LIDOCAINE HCL (CARDIAC) 20 MG/ML IV SOLN
INTRAVENOUS | Status: AC
  Filled 2015-02-24: qty 5

## 2015-02-24 MED ORDER — LACTATED RINGERS IV SOLN: INTRAVENOUS | Status: DC

## 2015-02-24 MED ORDER — DEXTROSE 5 % IV SOLN
1.0000 g | Freq: Three times a day (TID) | INTRAVENOUS | Status: DC
  Administered 2015-02-24 – 2015-02-25 (×3): 1 g via INTRAVENOUS
  Filled 2015-02-24 (×4): qty 1

## 2015-02-24 MED ORDER — LISINOPRIL 10 MG PO TABS
10.0000 mg | ORAL_TABLET | Freq: Every day | ORAL | Status: DC
  Filled 2015-02-24: qty 1

## 2015-02-24 MED ORDER — SUGAMMADEX SODIUM 500 MG/5ML IV SOLN
INTRAVENOUS | Status: DC | PRN
  Administered 2015-02-24: 500 mg via INTRAVENOUS

## 2015-02-24 MED ORDER — DEXAMETHASONE SODIUM PHOSPHATE 10 MG/ML IJ SOLN
INTRAMUSCULAR | Status: AC
  Filled 2015-02-24: qty 1

## 2015-02-24 MED ORDER — HYDROMORPHONE HCL 1 MG/ML IJ SOLN
0.2500 mg | INTRAMUSCULAR | Status: DC | PRN
  Administered 2015-02-24 (×2): 0.5 mg via INTRAVENOUS

## 2015-02-24 MED ORDER — KETOROLAC TROMETHAMINE 15 MG/ML IJ SOLN
15.0000 mg | Freq: Once | INTRAMUSCULAR | Status: AC
  Administered 2015-02-24: 15 mg via INTRAVENOUS
  Filled 2015-02-24: qty 1

## 2015-02-24 MED ORDER — LACTATED RINGERS IV SOLN
INTRAVENOUS | Status: DC | PRN
  Administered 2015-02-24: 08:00:00 via INTRAVENOUS

## 2015-02-24 MED ORDER — SUCCINYLCHOLINE CHLORIDE 20 MG/ML IJ SOLN
INTRAMUSCULAR | Status: DC | PRN
  Administered 2015-02-24: 140 mg via INTRAVENOUS

## 2015-02-24 MED ORDER — PROPOFOL 10 MG/ML IV BOLUS
INTRAVENOUS | Status: DC | PRN
  Administered 2015-02-24: 180 mg via INTRAVENOUS

## 2015-02-24 MED ORDER — BUPIVACAINE-EPINEPHRINE 0.25% -1:200000 IJ SOLN
INTRAMUSCULAR | Status: AC
Start: 2015-02-24 — End: 2015-02-24
  Filled 2015-02-24: qty 1

## 2015-02-24 MED ORDER — SUGAMMADEX SODIUM 500 MG/5ML IV SOLN
INTRAVENOUS | Status: AC
  Filled 2015-02-24: qty 5

## 2015-02-24 SURGICAL SUPPLY — 39 items
APL SKNCLS STERI-STRIP NONHPOA (GAUZE/BANDAGES/DRESSINGS)
APPLIER CLIP ROT 10 11.4 M/L (STAPLE)
APR CLP MED LRG 11.4X10 (STAPLE)
BAG SPEC RTRVL LRG 6X4 10 (ENDOMECHANICALS) ×1
BENZOIN TINCTURE PRP APPL 2/3 (GAUZE/BANDAGES/DRESSINGS) IMPLANT
CABLE HIGH FREQUENCY MONO STRZ (ELECTRODE) ×2 IMPLANT
CHLORAPREP W/TINT 26ML (MISCELLANEOUS) ×2 IMPLANT
CLIP APPLIE ROT 10 11.4 M/L (STAPLE) IMPLANT
COVER SURGICAL LIGHT HANDLE (MISCELLANEOUS) ×2 IMPLANT
CUTTER FLEX LINEAR 45M (STAPLE) ×1 IMPLANT
DECANTER SPIKE VIAL GLASS SM (MISCELLANEOUS) ×2 IMPLANT
DRAPE LAPAROSCOPIC ABDOMINAL (DRAPES) ×2 IMPLANT
ELECT REM PT RETURN 9FT ADLT (ELECTROSURGICAL) ×2
ELECTRODE REM PT RTRN 9FT ADLT (ELECTROSURGICAL) ×1 IMPLANT
ENDOLOOP SUT PDS II  0 18 (SUTURE)
ENDOLOOP SUT PDS II 0 18 (SUTURE) IMPLANT
GLOVE SURG SIGNA 7.5 PF LTX (GLOVE) ×2 IMPLANT
GOWN STRL REUS W/TWL XL LVL3 (GOWN DISPOSABLE) ×4 IMPLANT
KIT BASIN OR (CUSTOM PROCEDURE TRAY) ×2 IMPLANT
LIQUID BAND (GAUZE/BANDAGES/DRESSINGS) ×2 IMPLANT
POUCH SPECIMEN RETRIEVAL 10MM (ENDOMECHANICALS) ×2 IMPLANT
RELOAD 45 VASCULAR/THIN (ENDOMECHANICALS) IMPLANT
RELOAD STAPLE 45 2.5 WHT GRN (ENDOMECHANICALS) IMPLANT
RELOAD STAPLE 45 3.5 BLU ETS (ENDOMECHANICALS) IMPLANT
RELOAD STAPLE TA45 3.5 REG BLU (ENDOMECHANICALS) ×2 IMPLANT
SCISSORS LAP 5X35 DISP (ENDOMECHANICALS) ×2 IMPLANT
SET IRRIG TUBING LAPAROSCOPIC (IRRIGATION / IRRIGATOR) ×2 IMPLANT
SHEARS HARMONIC ACE PLUS 36CM (ENDOMECHANICALS) ×2 IMPLANT
SLEEVE XCEL OPT CAN 5 100 (ENDOMECHANICALS) ×2 IMPLANT
STAPLER VISISTAT 35W (STAPLE) ×2 IMPLANT
STRIP CLOSURE SKIN 1/2X4 (GAUZE/BANDAGES/DRESSINGS) IMPLANT
SUT MNCRL AB 4-0 PS2 18 (SUTURE) ×2 IMPLANT
SUT VIC AB 2-0 SH 18 (SUTURE) IMPLANT
TOWEL OR 17X26 10 PK STRL BLUE (TOWEL DISPOSABLE) ×2 IMPLANT
TOWEL OR NON WOVEN STRL DISP B (DISPOSABLE) ×2 IMPLANT
TRAY FOLEY W/METER SILVER 14FR (SET/KITS/TRAYS/PACK) ×2 IMPLANT
TRAY LAPAROSCOPIC (CUSTOM PROCEDURE TRAY) ×2 IMPLANT
TROCAR BLADELESS OPT 5 100 (ENDOMECHANICALS) ×2 IMPLANT
TROCAR XCEL BLUNT TIP 100MML (ENDOMECHANICALS) ×2 IMPLANT

## 2015-02-24 NOTE — H&P (Signed)
Re:   Tony [name] [name]:   02/25/1976 MRN:   [id number]   [facility] Admission  ASSESSMENT AND PLAN: 1.  Appendicitis  I discussed with the patient the indications and risks of appendiceal surgery.  The primary risks of appendiceal surgery include, but are not limited to, bleeding, infection, bowel surgery, and open surgery.  There is also the risk that the patient may have continued symptoms after surgery.   We discussed the typical post-operative recovery course. I tried to answer the patient's questions.  2.  History of kidney stones 3.  HTN x 10 years 4.  History of colonic polyps 5.  History of anxiety 6.  Back pain - DJD  Chief Complaint  Patient presents with  . Flank Pain   REFERRING PHYSICIAN: Tammi Sou, MD  HISTORY OF PRESENT ILLNESS: Tony [name] is a 39 y.o. ([name]: [birth year])  white  male whose primary care physician is MCGOWEN,PHILIP H, MD. He is in [facility] at [facility].  His father, Tony [name], Tony [name], and another friend are in the room with him.  He has had a prior history of kidney stones.  He had abdominal pain which began around 9 PM last night and at first he thought the pain was kidney stones.  But when it got worse, he worried about something else going on.  The pain has migrated to his RLQ.  He went to [facility] for evaluation. (His mother works there). He has no history of stomach, gall bladder or pancreas disease.  He got a colonoscopy about 1 year ago, because of friend died of colon cancer in his 37's.  CT scan of abdomen - 02/24/2015 - 1.  Mild acute appendicitis, with dilatation of the appendix to 9 mm in diameter, and mild surrounding soft tissue inflammation.   2. Diffuse fatty infiltration within the liver. WBC - 15,000 - 02/24/2015   Past Medical History  Diagnosis Date  . Psoriasis   . Anxiety   . HTN (hypertension)   . Insomnia   . Hx of colonic polyp - ssp 02/11/2014  . Anal fissure   . Hyperlipidemia   . Hepatic steatosis 2015      CT abd      Past Surgical History  Procedure Laterality Date  . Right ankle      2014  . Colonoscopy w/ biopsies  02/03/14    Cecal polyp (sessile serrated polyp w/out dysplasia) and posterior anal fissure; repeat TCS 2020 per Dr. Carlean Purl      Current Facility-Administered Medications  Medication Dose Route Frequency Provider Last Rate Last Dose  . 0.9 %  sodium chloride infusion   Intravenous Continuous Shanon Rosser, MD 125 mL/hr at 02/24/15 0645    . 0.9 %  sodium chloride infusion   Intravenous STAT John Molpus, MD      . cefTRIAXone (ROCEPHIN) 2 g injection           . HYDROmorphone (DILAUDID) injection 1 mg  1 mg Intravenous Q4H PRN John Molpus, MD      . ondansetron (ZOFRAN) injection 4 mg  4 mg Intravenous Q8H PRN John Molpus, MD         No Known Allergies  REVIEW OF SYSTEMS: Skin:  History of psoriasis Infection:  No history of hepatitis or HIV.  No history of MRSA. Neurologic:  No history of stroke.  No history of seizure.  No history of headaches. Cardiac:  HTN x 10 years.  Saw Dr. Rex Kras  about 5 years ago, but his symptoms turned out to be anxiety. Pulmonary:  Does not smoke cigarettes.  No asthma or bronchitis.  No OSA/CPAP.  Endocrine:  No diabetes. No thyroid disease. Gastrointestinal:  See HPI Urologic:  History of kidney stones. Musculoskeletal:  No history of joint or back disease. Hematologic:  No bleeding disorder.  No history of anemia.  Not anticoagulated. Psycho-social:  The patient is oriented.   The patient has no obvious psychologic or social impairment to understanding our conversation and plan.  SOCIAL and FAMILY HISTORY: Unmarried. His father, Tony [name], girlfriend, Tony [name], and another friend are in the room with him (the other friend's wife is Tony [name]). His mother works at [employer]. 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  PHYSICAL EXAM: BP 136/85 mmHg  Pulse 96  Temp(Src) 99 F (37.2 C)  (Oral)  Resp 19  Ht 5' 2"  (1.575 m)  Wt 123.2 kg (271 lb 9.7 oz)  BMI 49.66 kg/m2  SpO2 97%  General: Obese WM who is alert and generally healthy appearing.  Note, his recorded height is 5'2", but he is taller than that. HEENT: Normal. Pupils equal. Neck: Supple. No mass.  No thyroid mass. Lymph Nodes:  No supraclavicular or cervical nodes. Lungs: Clear to auscultation and symmetric breath sounds. Heart:  RRR. No murmur or rub. Abdomen: Soft. No mass.  No abdominal scars.  Tender in RLQ, no peritoneal signs. Rectal: Not done. Extremities:  Good strength and ROM  in upper and lower extremities. Neurologic:  Grossly intact to motor and sensory function. Psychiatric: Has normal mood and affect. Behavior is normal.   DATA REVIEWED: Epic notes.  Alphonsa Overall, MD,  [facility], [address]  [place], [place]    Box Phone:  [phone] FAX:  [phone]

## 2015-02-24 NOTE — ED Notes (Signed)
Rt flank pain radiating to abd  Onset last pm  w diff urination

## 2015-02-24 NOTE — ED Notes (Signed)
Out with carelink, no changes, alert, NAD, calm, interactive.

## 2015-02-24 NOTE — ED Provider Notes (Signed)
CSN: [id number]     Arrival date & time 02/24/15  1779 History   First MD Initiated Contact with Patient 02/24/15 0356     Chief Complaint  Patient presents with  . Flank Pain     (Consider location/radiation/quality/duration/timing/severity/associated sxs/prior Treatment) HPI  This is a 39 year old male who had the onset of right flank pain yesterday evening about 9 PM. The pain is described as severe and has subsequently moved to his right suprapubic area. The pain is similar to that of a previous kidney stone for which he did not seek medical attention. The pain is worse with movement or palpation. He has had nausea but no vomiting. He has had difficulty urinating but no hematuria. He took 5 milligrams of oxycodone about midnight without adequate relief.  Past Medical History  Diagnosis Date  . Psoriasis   . Anxiety   . HTN (hypertension)   . Insomnia   . Hx of colonic polyp - ssp 02/11/2014  . Anal fissure   . Hyperlipidemia   . Hepatic steatosis 2015    CT abd   Past Surgical History  Procedure Laterality Date  . Right ankle      2014  . Colonoscopy w/ biopsies  02/03/14    Cecal polyp (sessile serrated polyp w/out dysplasia) and posterior anal fissure; repeat TCS 2020 per Dr. Carlean Purl   Family History  Problem Relation Age of Onset  . Diabetes    . Heart disease     Social History  Substance Use Topics  . Smoking status: Never Smoker   . Smokeless tobacco: Never Used  . Alcohol Use: 7.2 oz/week    0 Standard drinks or equivalent, 12 Cans of beer per week    Review of Systems  All other systems reviewed and are negative.   Allergies  Review of patient's allergies indicates no known allergies.  Home Medications   Prior to Admission medications   Medication Sig Start Date End Date Taking? Authorizing Provider  clonazePAM (KLONOPIN) 0.5 MG tablet One tablet 3 times daily Patient taking differently: as needed. One tablet 3 times daily 10/31/11   Dorena Cookey,  MD  lisinopril (PRINIVIL,ZESTRIL) 10 MG tablet Take 1 tablet (10 mg total) by mouth daily. 12/06/14   Tammi Sou, MD   BP 150/120 mmHg  Pulse 115  Temp(Src) 98.4 F (36.9 C)  Resp 16  SpO2 96%   Physical Exam  General: Well-developed, well-nourished male in no acute distress; appearance consistent with age of record HENT: normocephalic; atraumatic Eyes: pupils equal, round and reactive to light; extraocular muscles intact Neck: supple Heart: regular rate and rhythm Lungs: clear to auscultation bilaterally Abdomen: soft; nondistended; right suprapubic tenderness; no masses or hepatosplenomegaly; bowel sounds present GU: No CVA tenderness Extremities: No deformity; full range of motion; pulses normal Neurologic: Awake, alert and oriented; motor function intact in all extremities and symmetric; no facial droop Skin: Warm and dry Psychiatric: Normal mood and affect    ED Course  Procedures (including critical care time)   MDM   Nursing notes and vitals signs, including pulse oximetry, reviewed.  Summary of this visit's results, reviewed by myself:  Labs:  Results for orders placed or performed during the hospital encounter of 02/24/15 (from the past 24 hour(s))  CBC WITH DIFFERENTIAL     Status: Abnormal   Collection Time: 02/24/15  3:50 AM  Result Value Ref Range   WBC 15.0 (H) 4.0 - 10.5 K/uL   RBC 4.86 4.22 - 5.81  MIL/uL   Hemoglobin 15.3 13.0 - 17.0 g/dL   HCT 45.0 39.0 - 52.0 %   MCV 92.6 78.0 - 100.0 fL   MCH 31.5 26.0 - 34.0 pg   MCHC 34.0 30.0 - 36.0 g/dL   RDW 12.0 11.5 - 15.5 %   Platelets 182 150 - 400 K/uL   Neutrophils Relative % 86 %   Neutro Abs 13.0 (H) 1.7 - 7.7 K/uL   Lymphocytes Relative 7 %   Lymphs Abs 1.0 0.7 - 4.0 K/uL   Monocytes Relative 6 %   Monocytes Absolute 0.9 0.1 - 1.0 K/uL   Eosinophils Relative 1 %   Eosinophils Absolute 0.1 0.0 - 0.7 K/uL   Basophils Relative 0 %   Basophils Absolute 0.0 0.0 - 0.1 K/uL  Basic metabolic  panel     Status: Abnormal   Collection Time: 02/24/15  3:50 AM  Result Value Ref Range   Sodium 132 (L) 135 - 145 mmol/L   Potassium 4.1 3.5 - 5.1 mmol/L   Chloride 102 101 - 111 mmol/L   CO2 22 22 - 32 mmol/L   Glucose, Bld 174 (H) 65 - 99 mg/dL   BUN 12 6 - 20 mg/dL   Creatinine, Ser 1.04 0.61 - 1.24 mg/dL   Calcium 9.4 8.9 - 10.3 mg/dL   GFR calc non Af Amer >60 >60 mL/min   GFR calc Af Amer >60 >60 mL/min   Anion gap 8 5 - 15  Urinalysis, Routine w reflex microscopic (not at [facility])     Status: None   Collection Time: 02/24/15  4:19 AM  Result Value Ref Range   Color, Urine YELLOW YELLOW   APPearance CLEAR CLEAR   Specific Gravity, Urine 1.010 1.005 - 1.030   pH 5.0 5.0 - 8.0   Glucose, UA NEGATIVE NEGATIVE mg/dL   Hgb urine dipstick NEGATIVE NEGATIVE   Bilirubin Urine NEGATIVE NEGATIVE   Ketones, ur NEGATIVE NEGATIVE mg/dL   Protein, ur NEGATIVE NEGATIVE mg/dL   Nitrite NEGATIVE NEGATIVE   Leukocytes, UA NEGATIVE NEGATIVE    Imaging Studies: Ct Renal Stone Study  02/24/2015  CLINICAL DATA:  Acute onset of right lower quadrant and right flank pain. Initial encounter. EXAM: CT ABDOMEN AND PELVIS WITHOUT CONTRAST TECHNIQUE: Multidetector CT imaging of the abdomen and pelvis was performed following the standard protocol without IV contrast. COMPARISON:  CT of the abdomen and pelvis performed 01/13/2014 FINDINGS: The visualized lung bases are clear. There is diffuse fatty infiltration within the liver, with mild sparing about the gallbladder fossa. The gallbladder is within normal limits. The pancreas and adrenal glands are unremarkable. The kidneys are unremarkable in appearance. There is no evidence of hydronephrosis. No renal or ureteral stones are seen. Mild nonspecific perinephric stranding is noted bilaterally. No free fluid is identified. The small bowel is unremarkable in appearance. The stomach is within normal limits. No acute vascular abnormalities are seen. The appendix is  dilated to 9 mm in diameter, with mild surrounding soft tissue inflammation, suspicious for mild acute appendicitis. No free fluid is seen. There is no evidence of perforation or abscess formation at this time. The colon is largely decompressed and unremarkable in appearance. The bladder is mildly distended and grossly unremarkable. A small urachal remnant is incidentally seen. The prostate remains normal in size. No inguinal lymphadenopathy is seen. No acute osseous abnormalities are identified. IMPRESSION: 1. Mild acute appendicitis, with dilatation of the appendix to 9 mm in diameter, and mild surrounding soft tissue inflammation.  No evidence of perforation or abscess formation at this time. 2. Diffuse fatty infiltration within the liver. These results were called by telephone at the time of interpretation on 02/24/2015 at 4:50 am to Dr. Shanon Rosser, who verbally acknowledged these results. Electronically Signed   By: Garald Balding M.D.   On: 02/24/2015 04:50   5:05 AM Rocephin and Flagyl IV ordered. Dr. Excell Seltzer accepts patient for transport to [facility] (inpatient).    Shanon Rosser, MD 02/24/15 [phone]

## 2015-02-24 NOTE — Anesthesia Postprocedure Evaluation (Signed)
Anesthesia Post Note  Patient: Tony [name]  Procedure(s) Performed: Procedure(s) (LRB): APPENDECTOMY LAPAROSCOPIC (N/A)  Patient location during evaluation: PACU Anesthesia Type: General Level of consciousness: awake and alert Pain management: pain level controlled Vital Signs Assessment: post-procedure vital signs reviewed and stable Respiratory status: spontaneous breathing, nonlabored ventilation, respiratory function stable and patient connected to nasal cannula oxygen Cardiovascular status: blood pressure returned to baseline and stable Postop Assessment: no signs of nausea or vomiting Anesthetic complications: no    Last Vitals:  Filed Vitals:   02/24/15 1111 02/24/15 1121  BP:  152/89  Pulse: 104 101  Temp:  37.1 C  Resp:  20    Last Pain:  Filed Vitals:   02/24/15 1315  PainSc: 3                  [name]

## 2015-02-24 NOTE — Op Note (Signed)
Re:   Tony [name] [name]:   1976/01/23 MRN:   [id number]                   FACILITY:  [facility]  DATE OF PROCEDURE: 02/24/2015                              OPERATIVE REPORT  PREOPERATIVE DIAGNOSIS:  Appendicitis  POSTOPERATIVE DIAGNOSIS:  Acute purulent appendicitis.  PROCEDURE:  Laparoscopic appendectomy.  SURGEON:  Fenton Malling. Lucia Gaskins, MD  ASSISTANT:  No first assistant.  ANESTHESIA:  General endotracheal.  Anesthesiologist: Rod Mae, MD CRNA: Lavina Hamman, CRNA  ASA:  2E  ESTIMATED BLOOD LOSS:  Minimal.  DRAINS: none   SPECIMEN:   Appendix  COUNTS CORRECT:  YES  INDICATIONS FOR PROCEDURE: Tony [name] is a 39 y.o. ([name]: May 12, 1975) white  male whose primary care doctor is MCGOWEN,PHILIP H, MD and comes to the OR for an appendectomy.   I discussed with the patient, the indications and potential complications of appendiceal surgery.  The potential complications include, but are not limited to, bleeding, open surgery, bowel resection, and the possibility of another diagnosis.  OPERATIVE NOTE:  The patient underwent a general endotracheal anesthetic as supervised by Anesthesiologist: Rod Mae, MD CRNA: Lavina Hamman, CRNA, General, in room #2 at [facility].  The patient was on rocephin and flagyl prior to the beginning of the procedure and the abdomen was prepped with ChloraPrep.    A time-out was held and surgical checklist run.  An infraumbilical incision was made with sharp dissection carried down to the abdominal cavity.  An 12 mm Hasson trocar was inserted through the infraumbilical incision and into the peritoneal cavity.  A 30 degree 5 mm laparoscope was inserted through a 12 mm Hasson trocar and the Hasson trocar secured with a 0 Vicryl suture.  I placed a 5 mm trocar in the right upper quadrant and 5 mm torcar in left lower quadrant and did abdominal exploration.    The right and left lobes of liver unremarkable.  Stomach was unremarkable.  The pelvic organs were  unremarkable.  The patient has a lot of central adiposity.  I saw no other intra-abdominal abnormality.  The patient had appendicitis with the appendix located at the right pelvic brim, behind the terminal ileum.  There was purulence and inflammation of the distal 1/3 of the appendix.  The mesentery of the appendix was divided with a Harmonic scalpel.  I got to the base of the appendix.  I then used a blue load 45 mm Ethicon Endo-GIA stapler and fired this across the base of the appendix.  I placed the appendix in EndoCatch bag and delivered the bag through the umbilical incision.  I irrigated the abdomen with 500 cc of saline.  After irrigating the abdomen, I then removed the trocars, in turn.  The umbilical port fascia was closed with 0 Vicryl suture. At the umbilicus, I closed the subcutaneous tissue at the umbilicus with a 2-0 vicryl.   I closed the skin each site with a 4-0 Monocryl suture and painted the wounds with LiquidBand.  I then injected a total of 30 mL of 0.25% Marcaine at the incisions.  Sponge and needle count were correct at the end of the case.  The patient was transferred to the recovery room in good condition.  The patient tolerated the procedure well and it depends on the patient's post op clinical  course as to when the patient could be discharged.   Alphonsa Overall, MD, [facility] Pager: [phone] Office phone:  [phone]

## 2015-02-24 NOTE — Discharge Instructions (Signed)
[facility] ______[facility] °LAPAROSCOPIC SURGERY: POST OP INSTRUCTIONS °Always review your discharge instruction sheet given to you by the facility where your surgery was performed. °IF YOU HAVE DISABILITY OR FAMILY LEAVE FORMS, YOU MUST BRING THEM TO THE OFFICE FOR PROCESSING.   °DO NOT GIVE THEM TO YOUR DOCTOR. ° °1. A prescription for pain medication may be given to you upon discharge.  Take your pain medication as prescribed, if needed.  If narcotic pain medicine is not needed, then you may take acetaminophen (Tylenol) or ibuprofen (Advil) as needed. °2. Take your usually prescribed medications unless otherwise directed. °3. If you need a refill on your pain medication, please contact your pharmacy.  They will contact our office to request authorization. Prescriptions will not be filled after 5pm or on week-ends. °4. You should follow a light diet the first few days after arrival home, such as soup and crackers, etc.  Be sure to include lots of fluids daily. °5. Most patients will experience some swelling and bruising in the area of the incisions.  Ice packs will help.  Swelling and bruising can take several days to resolve.  °6. It is common to experience some constipation if taking pain medication after surgery.  Increasing fluid intake and taking a stool softener (such as Colace) will usually help or prevent this problem from occurring.  A mild laxative (Milk of Magnesia or Miralax) should be taken according to package instructions if there are no bowel movements after 48 hours. °7. Unless discharge instructions indicate otherwise, you may remove your bandages 24-48 hours after surgery, and you may shower at that time.  You may have steri-strips (small skin tapes) in place directly over the incision.  These strips should be left on the skin for 7-10 days.  If your surgeon used skin glue on the incision, you may shower in 24 hours.  The glue will flake off over the next 2-3 weeks.  Any sutures or  staples will be removed at the office during your follow-up visit. °8. ACTIVITIES:  You may resume regular (light) daily activities beginning the next day--such as daily self-care, walking, climbing stairs--gradually increasing activities as tolerated.  You may have sexual intercourse when it is comfortable.  Refrain from any heavy lifting or straining until approved by your doctor. °a. You may drive when you are no longer taking prescription pain medication, you can comfortably wear a seatbelt, and you can safely maneuver your car and apply brakes. °b. RETURN TO WORK:  __________________________________________________________ °9. You should see your doctor in the office for a follow-up appointment approximately 2-3 weeks after your surgery.  Make sure that you call for this appointment within a day or two after you arrive home to insure a convenient appointment time. °10. OTHER INSTRUCTIONS: __________________________________________________________________________________________________________________________ __________________________________________________________________________________________________________________________ °WHEN TO CALL YOUR DOCTOR: °1. Fever over 101.0 °2. Inability to urinate °3. Continued bleeding from incision. °4. Increased pain, redness, or drainage from the incision. °5. Increasing abdominal pain ° °The clinic staff is available to answer your questions during regular business hours.  Please don’t hesitate to call and ask to speak to one of the nurses for clinical concerns.  If you have a medical emergency, go to the nearest emergency room or call 911.  A surgeon from [facility] is always on call at the hospital. °[address], [address], [place], [place]  [address] ? [address] [address], [place], [place]   [address] °[phone] ? [phone] ? FAX [phone] °Web site:   [email or web address] °

## 2015-02-24 NOTE — ED Notes (Signed)
C/o rt flank pain radiating to abd onset last pm w some nausea, diff w urination

## 2015-02-24 NOTE — Anesthesia Preprocedure Evaluation (Addendum)
Anesthesia Evaluation  Patient identified by MRN, date of birth, ID band Patient awake    Reviewed: Allergy & Precautions, H&P , NPO status , Patient's Chart, lab work & pertinent test results  Airway Mallampati: III  TM Distance: >3 FB Neck ROM: full    Dental no notable dental hx. (+) Dental Advisory Given, Teeth Intact   Pulmonary neg pulmonary ROS,    Pulmonary exam normal breath sounds clear to auscultation       Cardiovascular Exercise Tolerance: Good hypertension, Normal cardiovascular exam Rhythm:regular Rate:Normal     Neuro/Psych negative neurological ROS  negative psych ROS   GI/Hepatic negative GI ROS, Neg liver ROS,   Endo/Other  negative endocrine ROSMorbid obesity  Renal/GU negative Renal ROS  negative genitourinary   Musculoskeletal   Abdominal (+) + obese,   Peds  Hematology negative hematology ROS (+)   Anesthesia Other Findings   Reproductive/Obstetrics negative OB ROS                            Anesthesia Physical Anesthesia Plan  ASA: II  Anesthesia Plan: General   Post-op Pain Management:    Induction: Intravenous, Rapid sequence and Cricoid pressure planned  Airway Management Planned: Oral ETT  Additional Equipment:   Intra-op Plan:   Post-operative Plan: Extubation in OR  Informed Consent: I have reviewed the patients History and Physical, chart, labs and discussed the procedure including the risks, benefits and alternatives for the proposed anesthesia with the patient or authorized representative who has indicated his/her understanding and acceptance.   Dental Advisory Given  Plan Discussed with: CRNA and Surgeon  Anesthesia Plan Comments:         Anesthesia Quick Evaluation

## 2015-02-24 NOTE — Transfer of Care (Signed)
Immediate Anesthesia Transfer of Care Note  Patient: Tony [name]  Procedure(s) Performed: Procedure(s): APPENDECTOMY LAPAROSCOPIC (N/A)  Patient Location: PACU  Anesthesia Type:General  Level of Consciousness:  sedated, patient cooperative and responds to stimulation  Airway & Oxygen Therapy:Patient Spontanous Breathing and Patient connected to face mask oxgen  Post-op Assessment:  Report given to PACU RN and Post -op Vital signs reviewed and stable  Post vital signs:  Reviewed and stable  Last Vitals:  Filed Vitals:   02/24/15 0820 02/24/15 1040  BP: 120/74 172/97  Pulse: 99 110  Temp: 37.3 C   Resp: 18 10    Complications: No apparent anesthesia complications

## 2015-02-24 NOTE — Anesthesia Procedure Notes (Signed)
Procedure Name: Intubation Date/Time: 02/24/2015 9:35 AM Performed by: Arlisa Leclere, Virgel Gess Pre-anesthesia Checklist: Patient identified, Emergency Drugs available, Suction available, Patient being monitored and Timeout performed Patient Re-evaluated:Patient Re-evaluated prior to inductionOxygen Delivery Method: Circle system utilized Preoxygenation: Pre-oxygenation with 100% oxygen Intubation Type: IV induction Ventilation: Mask ventilation without difficulty Laryngoscope Size: Mac and 4 Grade View: Grade II Tube type: Oral Tube size: 7.5 mm Number of attempts: 1 Airway Equipment and Method: Stylet Placement Confirmation: ETT inserted through vocal cords under direct vision,  positive ETCO2,  CO2 detector and breath sounds checked- equal and bilateral Secured at: 23 cm Tube secured with: Tape Dental Injury: Teeth and Oropharynx as per pre-operative assessment

## 2015-02-25 MED ORDER — CLONAZEPAM 0.5 MG PO TABS: 0.5000 mg | ORAL_TABLET | Freq: Three times a day (TID) | ORAL | Status: DC | PRN

## 2015-02-25 MED ORDER — OXYCODONE HCL 5 MG PO TABS: 5.0000 mg | ORAL_TABLET | Freq: Every day | ORAL | Status: DC | PRN

## 2015-02-25 MED ORDER — LISINOPRIL 10 MG PO TABS: 10.0000 mg | ORAL_TABLET | Freq: Every day | ORAL | Status: DC

## 2015-02-25 NOTE — Discharge Summary (Signed)
Physician Discharge Summary  Tony [name] [name] [name]: Nov 20, 1975 DOA: 02/24/2015  PCP: Tony Sou, MD  Admit date: 02/24/2015 Discharge date: 02/25/2015  Recommendations for Outpatient Follow-up:   Follow-up Information    Follow up with Tony Miyamoto, PA-C On 03/15/2015.   Specialty:  Surgery   Why:  [facility], 11:15am, arrive no later than 10:45am for paperwork and check in   Contact information:   [address] [place] [address] [phone]      Discharge Diagnoses:  1. Acute appendicitis 2. HTN 3. anxiety  Surgical Procedure: laparoscopic appendectomy by Tony [name]  Discharge Condition: good Disposition: home  Diet recommendation: regular  Filed Weights   02/24/15 0639  Weight: 123.2 kg (271 lb 9.7 oz)    Hospital Course:  Patient presented with acute appendicitis and underwent lap appendectomy by Tony [name]. Please see op note for additional details. He was continued on his home meds. Postoperatively he did well. On POD 1, he was tolerating solids. His vitals were stable. His pain was controlled on pain medication. He had voided without difficulty. He was ambulating. He was deemed stable for dc. We discussed dc instructions. He requested refills on his anxiety and HTN medication since he was running low. I refilled his HTN but did not refill his anxiety medication. The EMR indicated he had 5 refills orginially prescribed.  BP 113/77 mmHg  Pulse 79  Temp(Src) 98.9 F (37.2 C) (Oral)  Resp 18  Ht 5' 2"  (1.575 m)  Wt 123.2 kg (271 lb 9.7 oz)  BMI 49.66 kg/m2  SpO2 96%  Gen: alert, NAD, non-toxic appearing Pupils: equal, no scleral icterus, EOMI, mild hemorrhage in right lateral sclera Pulm: Lungs clear to auscultation, symmetric chest rise CV: regular rate and rhythm Abd: soft, min tenderness, nondistended. +BS. No cellulitis. No incisional hernia Ext: no edema, no calf tenderness Skin: no rash, no  jaundice    Discharge Instructions  Discharge Instructions    Call MD for:  difficulty breathing, headache or visual disturbances    Complete by:  As directed      Call MD for:  persistant nausea and vomiting    Complete by:  As directed      Call MD for:  redness, tenderness, or signs of infection (pain, swelling, redness, odor or green/yellow discharge around incision site)    Complete by:  As directed      Call MD for:  severe uncontrolled pain    Complete by:  As directed      Call MD for:    Complete by:  As directed   Temp >101     Diet general    Complete by:  As directed      Discharge instructions    Complete by:  As directed   See [facility] discharge instructions     Increase activity slowly    Complete by:  As directed             Medication List    STOP taking these medications        azithromycin 250 MG tablet  Commonly known as:  ZITHROMAX     benzonatate 200 MG capsule  Commonly known as:  TESSALON     calcipotriene 0.005 % cream  Commonly known as:  DOVONOX     diltiazem 2 % Gel     eszopiclone 3 MG Tabs  Generic drug:  Eszopiclone     guaiFENesin 600 MG 12 hr tablet  Commonly known as:  MUCINEX     nystatin-triamcinolone ointment  Commonly known as:  MYCOLOG     oxyCODONE-acetaminophen 10-325 MG tablet  Commonly known as:  PERCOCET      TAKE these medications        clonazePAM 0.5 MG tablet  Commonly known as:  KLONOPIN  Take 1 tablet (0.5 mg total) by mouth 3 (three) times daily as needed for anxiety. One tablet 3 times daily     lisinopril 10 MG tablet  Commonly known as:  PRINIVIL,ZESTRIL  Take 1 tablet (10 mg total) by mouth daily.     MULTIVITAMIN GUMMIES ADULT PO  Take 2 each by mouth daily.     oxyCODONE 5 MG immediate release tablet  Commonly known as:  Oxy IR/ROXICODONE  Take 1-2 tablets (5-10 mg total) by mouth daily as needed for moderate pain or severe pain.           Follow-up Information    Follow up with LIEPINS,  ANDY, PA-C On 03/15/2015.   Specialty:  Surgery   Why:  [facility], 11:15am, arrive no later than 10:45am for paperwork and check in   Contact information:   [place] [address] [phone]        The results of significant diagnostics from this hospitalization (including imaging, microbiology, ancillary and laboratory) are listed below for reference.    Significant Diagnostic Studies: Ct Renal Stone Study  02/24/2015  CLINICAL DATA:  Acute onset of right lower quadrant and right flank pain. Initial encounter. EXAM: CT ABDOMEN AND PELVIS WITHOUT CONTRAST TECHNIQUE: Multidetector CT imaging of the abdomen and pelvis was performed following the standard protocol without IV contrast. COMPARISON:  CT of the abdomen and pelvis performed 01/13/2014 FINDINGS: The visualized lung bases are clear. There is diffuse fatty infiltration within the liver, with mild sparing about the gallbladder fossa. The gallbladder is within normal limits. The pancreas and adrenal glands are unremarkable. The kidneys are unremarkable in appearance. There is no evidence of hydronephrosis. No renal or ureteral stones are seen. Mild nonspecific perinephric stranding is noted bilaterally. No free fluid is identified. The small bowel is unremarkable in appearance. The stomach is within normal limits. No acute vascular abnormalities are seen. The appendix is dilated to 9 mm in diameter, with mild surrounding soft tissue inflammation, suspicious for mild acute appendicitis. No free fluid is seen. There is no evidence of perforation or abscess formation at this time. The colon is largely decompressed and unremarkable in appearance. The bladder is mildly distended and grossly unremarkable. A small urachal remnant is incidentally seen. The prostate remains normal in size. No inguinal lymphadenopathy is seen. No acute osseous abnormalities are identified. IMPRESSION: 1. Mild acute appendicitis, with  dilatation of the appendix to 9 mm in diameter, and mild surrounding soft tissue inflammation. No evidence of perforation or abscess formation at this time. 2. Diffuse fatty infiltration within the liver. These results were called by telephone at the time of interpretation on 02/24/2015 at 4:50 am to Tony. Shanon Rosser, who verbally acknowledged these results. Electronically Signed   By: Garald Balding M.D.   On: 02/24/2015 04:50    Microbiology: No results found for this or any previous visit (from the past 240 hour(s)).   Labs: Basic Metabolic Panel:  Recent Labs Lab 02/24/15 0350  NA 132*  K 4.1  CL 102  CO2 22  GLUCOSE 174*  BUN 12  CREATININE 1.04  CALCIUM 9.4  Liver Function Tests: No results for input(s): AST, ALT, ALKPHOS, BILITOT, PROT, ALBUMIN in the last 168 hours. No results for input(s): LIPASE, AMYLASE in the last 168 hours. No results for input(s): AMMONIA in the last 168 hours. CBC:  Recent Labs Lab 02/24/15 0350  WBC 15.0*  NEUTROABS 13.0*  HGB 15.3  HCT 45.0  MCV 92.6  PLT 182   Cardiac Enzymes: No results for input(s): CKTOTAL, CKMB, CKMBINDEX, TROPONINI in the last 168 hours. BNP: BNP (last 3 results) No results for input(s): BNP in the last 8760 hours.  ProBNP (last 3 results) No results for input(s): PROBNP in the last 8760 hours.  CBG:  Recent Labs Lab 02/24/15 0822  GLUCAP 109*    Active Problems:   Acute appendicitis   Time coordinating discharge: 15 minutes  Signed:  Gayland Curry, MD [facility], [place] [phone] 02/25/2015, 9:36 AM

## 2015-02-25 NOTE — Progress Notes (Signed)
Patient d/c home, on stable condition. Prescription given Pain is controlled. Ambulatory. Discharge instructions given, verbalized understanding.

## 2015-02-25 NOTE — Progress Notes (Signed)
Utilization Review Completed.Donne Anon T12/31/2016

## 2015-02-28 ENCOUNTER — Encounter (HOSPITAL_COMMUNITY): Payer: Self-pay | Admitting: Family Medicine

## 2015-05-25 ENCOUNTER — Ambulatory Visit (INDEPENDENT_AMBULATORY_CARE_PROVIDER_SITE_OTHER): Payer: BLUE CROSS/BLUE SHIELD | Admitting: Family Medicine

## 2015-05-25 ENCOUNTER — Encounter: Payer: Self-pay | Admitting: Family Medicine

## 2015-05-25 VITALS — BP 126/80 | HR 94 | Temp 98.2°F | Resp 16 | Ht 69.75 in | Wt 272.5 lb

## 2015-05-25 DIAGNOSIS — E785 Hyperlipidemia, unspecified: Secondary | ICD-10-CM

## 2015-05-25 DIAGNOSIS — R635 Abnormal weight gain: Secondary | ICD-10-CM | POA: Diagnosis not present

## 2015-05-25 DIAGNOSIS — F411 Generalized anxiety disorder: Secondary | ICD-10-CM | POA: Diagnosis not present

## 2015-05-25 DIAGNOSIS — E782 Mixed hyperlipidemia: Secondary | ICD-10-CM | POA: Insufficient documentation

## 2015-05-25 DIAGNOSIS — I10 Essential (primary) hypertension: Secondary | ICD-10-CM | POA: Diagnosis not present

## 2015-05-25 HISTORY — DX: Abnormal weight gain: R63.5

## 2015-05-25 MED ORDER — CLONAZEPAM 0.5 MG PO TABS: 0.5000 mg | ORAL_TABLET | Freq: Three times a day (TID) | ORAL | Status: DC | PRN

## 2015-05-25 NOTE — Progress Notes (Signed)
Office Note 05/25/2015  CC:  Chief Complaint  Patient presents with  . Establish Care    transfer from Dr. Sherren Mocha  . Follow-up    HPI:  Tony [name] is a 40 y.o. White male who is here to transfer care and f/u HTN. Patient's most recent primary MD: Dr. Sherren Mocha with [facility] (retired). Old records in EPIC/HL were reviewed prior to or during today's visit.  Pt was drinking a 5th of liquor every other day for about 5 mo, felt right side pain and then cut back on drinking and it went away.  He started drinking again and the pain returned (R side between pelvis bone and ribs).   He then stopped drinking completely 1 month ago and says he still intermittently feels the pain after lying on back for a while.  No hx of injury.    Of note, has insomnia and has not been taking the lunesta he was rx'd b/c he had been drinking too much until recently and he did not want to mix the med with alcohol.  He drinks to self medicate his anxiety.  He briefly informed me that he initially was dx'd with exercise-induced anxiety/panic.  Since that time it has become more generalized anxiety.  Was seeing psychiatrist, Dr. Guinevere Scarlet but he retired.  His replacement was a person that the pt does not want to return to. He tried to get in with Dr. Caprice Beaver at same office but her schedule was too busy. He is on clonazepam and is trying to slowly ween off of it.  He is down from 4 tabs per day to currently taking 2 and 1/2 tabs per day.  He has not been checking bp at home last 5 mo.  Has been compliant with bp med.  Past Medical History  Diagnosis Date  . Psoriasis   . Anxiety   . HTN (hypertension)   . Insomnia   . Hx of colonic polyp - ssp 02/11/2014  . Anal fissure   . Hyperlipidemia   . Hepatic steatosis 2015; 2016    CT abd  . History of kidney stones     [facility]  . Abnormal weight gain 05/25/2015    Past Surgical History  Procedure Laterality Date  . Right ankle      2014  . Colonoscopy w/  biopsies  02/03/14    Cecal polyp (sessile serrated polyp w/out dysplasia) and posterior anal fissure; repeat TCS 2020 per Dr. Carlean Purl  . Laparoscopic appendectomy N/A 02/24/2015    Procedure: APPENDECTOMY LAPAROSCOPIC;  Surgeon: Alphonsa Overall, MD;  Location: [facility];  Service: General;  Laterality: N/A;    Family History  Problem Relation Age of Onset  . Diabetes Mother   . Restless legs syndrome Mother   . Heart disease Mother   . Heart attack Mother 23  . Psoriasis Father   . Stroke Neg Hx   . Cancer Neg Hx     Social History   Social History  . Marital Status: Single    Spouse Name: N/A  . Number of Children: N/A  . Years of Education: N/A   Occupational History  . Secondary school teacher    Social History Main Topics  . Smoking status: Never Smoker   . Smokeless tobacco: Never Used  . Alcohol Use: 0.0 oz/week    0 Standard drinks or equivalent per week     Comment: once every 2 weeks, about 6-7 beers  . Drug Use: No  . Sexual Activity:  Not on file   Other Topics Concern  . Not on file   Social History Narrative   Single, no children.   Orig from [place].   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   No tobacco.   Alcohol: hx of "heavy drinking"   MEDS: pt not taking oxycodone or eszopiclone listed below Outpatient Encounter Prescriptions as of 05/25/2015  Medication Sig  . clonazePAM (KLONOPIN) 0.5 MG tablet Take 1 tablet (0.5 mg total) by mouth 3 (three) times daily as needed for anxiety. One tablet 3 times daily  . Eszopiclone 3 MG TABS Take 3 mg by mouth at bedtime. Take immediately before bedtime  . lisinopril (PRINIVIL,ZESTRIL) 10 MG tablet Take 1 tablet (10 mg total) by mouth daily.  . meloxicam (MOBIC) 15 MG tablet Take 15 mg by mouth daily as needed for pain.  . Multiple Vitamins-Minerals (MULTIVITAMIN GUMMIES ADULT PO) Take 2 each by mouth daily.  . [DISCONTINUED] clonazePAM (KLONOPIN) 0.5 MG tablet Take 1 tablet (0.5 mg total) by mouth 3 (three) times daily as needed for  anxiety. One tablet 3 times daily  . [DISCONTINUED] oxyCODONE (OXY IR/ROXICODONE) 5 MG immediate release tablet Take 1-2 tablets (5-10 mg total) by mouth daily as needed for moderate pain or severe pain. (Patient not taking: Reported on 05/25/2015)   No facility-administered encounter medications on file as of 05/25/2015.    No Known Allergies  ROS Review of Systems  Constitutional: Positive for unexpected weight change (Gained approx 20+ lbs while he was drinking a lot). Negative for fever and fatigue.  HENT: Negative for congestion and sore throat.   Eyes: Negative for visual disturbance.  Respiratory: Negative for cough.   Cardiovascular: Negative for chest pain.  Gastrointestinal: Negative for nausea and abdominal pain.  Genitourinary: Negative for dysuria.  Musculoskeletal: Negative for back pain and joint swelling.  Skin: Negative for rash.  Neurological: Negative for weakness and headaches.  Hematological: Negative for adenopathy.    PE; Blood pressure 126/80, pulse 94, temperature 98.2 F (36.8 C), temperature source Oral, resp. rate 16, height 5' 9.75" (1.772 m), weight 272 lb 8 oz (123.605 kg), SpO2 96 %. Gen: Alert, well appearing.  Patient is oriented to person, place, time, and situation. AFFECT: pleasant, lucid thought and speech. ELF:YBOF: no injection, icteris, swelling, or exudate.  EOMI, PERRLA. Mouth: lips without lesion/swelling.  Oral mucosa pink and moist. Oropharynx without erythema, exudate, or swelling.  Neck - No masses or thyromegaly or limitation in range of motion CV: RRR, no m/r/g.   LUNGS: CTA bilat, nonlabored resps, good aeration in all lung fields. ABD: soft, NT, ND, BS normal.  No hepatospenomegaly or mass.  No bruits. EXT: no clubbing, cyanosis, or edema.  BACK/Sides: no tenderness or deformity noted.  Pertinent labs:  Lab Results  Component Value Date   TSH 1.26 11/14/2011   Lab Results  Component Value Date   WBC 15.0* 02/24/2015   HGB  15.3 02/24/2015   HCT 45.0 02/24/2015   MCV 92.6 02/24/2015   PLT 182 02/24/2015   Lab Results  Component Value Date   CREATININE 1.04 02/24/2015   BUN 12 02/24/2015   NA 132* 02/24/2015   K 4.1 02/24/2015   CL 102 02/24/2015   CO2 22 02/24/2015   Lab Results  Component Value Date   ALT 50 02/13/2012   AST 31 02/13/2012   ALKPHOS 63 02/13/2012   BILITOT 0.3 02/13/2012   Lab Results  Component Value Date   CHOL 230* 02/13/2012   Lab Results  Component Value Date   HDL 27.50* 02/13/2012   No results found for: [facility] Lab Results  Component Value Date   TRIG * 02/13/2012    488.0 Triglyceride is over 400; calculations on Lipids are invalid.   Lab Results  Component Value Date   CHOLHDL 8 02/13/2012   ASSESSMENT AND PLAN:   Transfer pt;  1) HTN; The current medical regimen is effective;  continue present plan and medications.  2) Alcohol abuse: pt has been sober for 1 month now.  Encouraged him to completely abstain.  3) Chronic anxiety: I'm ok with him being on benzo, RF'd med today, and I'm ok with him weening off this slowly as he says he wants to do.  However, I feel like he needs a plan for lifelong approach to managing his anxiety and we'll discuss this more at next f/u (counselor?  SSRI or SNRI?).  4) Abnormal wt gain: suspect this was due to his alcohol abuse and poor food choices+ lack of activity during this time period.  Will check TSH, however.  5) Insomnia; suspect this is highly anxiety related.  He may now start his lunesta that he has previously been rx'd.  An After Visit Summary was printed and given to the patient.  Pt not fasting today, so he'll get his labs done at next visit in 15mo fasting lipid panel, CMET, TSH.  Return in about 4 weeks (around 06/22/2015) for routine chronic illness f/u-fasting (30 min).  Signed:  PCrissie Sickles MD           05/25/2015

## 2015-05-25 NOTE — Progress Notes (Signed)
Pre visit review using our clinic review tool, if applicable. No additional management support is needed unless otherwise documented below in the visit note. 

## 2015-06-08 ENCOUNTER — Ambulatory Visit (INDEPENDENT_AMBULATORY_CARE_PROVIDER_SITE_OTHER): Payer: BLUE CROSS/BLUE SHIELD | Admitting: Family Medicine

## 2015-06-08 ENCOUNTER — Encounter: Payer: Self-pay | Admitting: Family Medicine

## 2015-06-08 VITALS — BP 137/82 | HR 82 | Temp 98.2°F | Resp 16 | Ht 69.75 in | Wt 266.8 lb

## 2015-06-08 DIAGNOSIS — F411 Generalized anxiety disorder: Secondary | ICD-10-CM

## 2015-06-08 DIAGNOSIS — E785 Hyperlipidemia, unspecified: Secondary | ICD-10-CM | POA: Diagnosis not present

## 2015-06-08 DIAGNOSIS — I10 Essential (primary) hypertension: Secondary | ICD-10-CM

## 2015-06-08 DIAGNOSIS — R635 Abnormal weight gain: Secondary | ICD-10-CM | POA: Diagnosis not present

## 2015-06-08 LAB — LIPID PANEL
CHOLESTEROL: 213 mg/dL — AB (ref 0–200)
HDL: 38.5 mg/dL — ABNORMAL LOW (ref >39.00)
LDL CALC: 141 mg/dL — AB (ref 0–99)
NonHDL: 174.62
Total CHOL/HDL Ratio: 6
Triglycerides: 166 mg/dL — ABNORMAL HIGH (ref 0.0–149.0)
VLDL: 33.2 mg/dL (ref 0.0–40.0)

## 2015-06-08 LAB — COMPREHENSIVE METABOLIC PANEL
ALBUMIN: 4.5 g/dL (ref 3.5–5.2)
ALK PHOS: 68 U/L (ref 39–117)
ALT: 95 U/L — AB (ref 0–53)
AST: 55 U/L — AB (ref 0–37)
BILIRUBIN TOTAL: 0.5 mg/dL (ref 0.2–1.2)
BUN: 13 mg/dL (ref 6–23)
CALCIUM: 9.5 mg/dL (ref 8.4–10.5)
CO2: 26 mEq/L (ref 19–32)
CREATININE: 1.03 mg/dL (ref 0.40–1.50)
Chloride: 104 mEq/L (ref 96–112)
GFR: 85.52 mL/min (ref >60.00)
Glucose, Bld: 98 mg/dL (ref 70–99)
Potassium: 4.1 mEq/L (ref 3.5–5.1)
SODIUM: 137 meq/L (ref 135–145)
TOTAL PROTEIN: 7.3 g/dL (ref 6.0–8.3)

## 2015-06-08 LAB — TSH: TSH: 1.03 u[IU]/mL (ref 0.35–4.50)

## 2015-06-08 MED ORDER — DULOXETINE HCL 30 MG PO CPEP
ORAL_CAPSULE | ORAL | Status: DC
Start: 2015-06-08 — End: 2015-07-28

## 2015-06-08 NOTE — Progress Notes (Signed)
OFFICE VISIT  06/08/2015   CC:  Chief Complaint  Patient presents with  . Follow-up    Pt is fasting.      HPI:    Patient is a 40 y.o. Caucasian male who presents for 2 wk f/u anxiety and alcohol abuse, hx of hyperlipidemia. Due for fasting labs today. Since I last saw him he had a glass of wine at an anniversary dinner and nothing else.  Anxiety: clonazepam dosing is now 2 to 2 and 1/2 pills per day.  He is using his lunesta now on prn basis. He is open to trial of antidepressant to treat his chronic anxiety.  Past Medical History  Diagnosis Date  . Psoriasis   . Anxiety   . HTN (hypertension)   . Insomnia   . Hx of colonic polyp - ssp 02/11/2014  . Anal fissure   . Hyperlipidemia   . Hepatic steatosis 2015; 2016    CT abd  . History of kidney stones     [facility]  . Abnormal weight gain 05/25/2015  . Alcohol abuse 2016/2017    Pt states he was self medicating his anxiety    Past Surgical History  Procedure Laterality Date  . Right ankle      2014  . Colonoscopy w/ biopsies  02/03/14    Cecal polyp (sessile serrated polyp w/out dysplasia) and posterior anal fissure; repeat TCS 2020 per Dr. Carlean Purl  . Laparoscopic appendectomy N/A 02/24/2015    Procedure: APPENDECTOMY LAPAROSCOPIC;  Surgeon: Alphonsa Overall, MD;  Location: [facility];  Service: General;  Laterality: N/A;    Outpatient Prescriptions Prior to Visit  Medication Sig Dispense Refill  . clonazePAM (KLONOPIN) 0.5 MG tablet Take 1 tablet (0.5 mg total) by mouth 3 (three) times daily as needed for anxiety. One tablet 3 times daily 100 tablet 1  . Eszopiclone 3 MG TABS Take 3 mg by mouth at bedtime. Take immediately before bedtime    . lisinopril (PRINIVIL,ZESTRIL) 10 MG tablet Take 1 tablet (10 mg total) by mouth daily. 30 tablet 1  . meloxicam (MOBIC) 15 MG tablet Take 15 mg by mouth daily as needed for pain.    . Multiple Vitamins-Minerals (MULTIVITAMIN GUMMIES ADULT PO) Take 2 each by mouth daily.      No facility-administered medications prior to visit.    No Known Allergies  ROS As per HPI  PE: Blood pressure 137/82, pulse 82, temperature 98.2 F (36.8 C), temperature source Oral, resp. rate 16, height 5' 9.75" (1.772 m), weight 266 lb 12 oz (120.997 kg), SpO2 96 %. Gen: Alert, well appearing.  Patient is oriented to person, place, time, and situation. AFFECT: pleasant, lucid thought and speech. No further exam today.  LABS:  None today  IMPRESSION AND PLAN:  1) Alcohol abuse: pt self medicated to treat his chronic anxiety per his report. His doing well abstaining.  2) HTN: The current medical regimen is effective;  continue present plan and medications. Lytes/Cr today.  3) Chronic anxiety: pt states "exercise-induced" anxiety initially, and then this blossomed into generalized anxiety. He'll continue to try to slowly ween down the clonazepam use. We agreed to do trial of antidepressant for his anxiety today: duloxetine 56m qd. Therapeutic expectations and side effect profile of medication discussed today.  Patient's questions answered.  4) Chronic fatigue: suspect multifactorial (alc abuse until recently, deconditioned, chronic anxiety + poor sleep). We'll see how he does with getting his chronic anxiety better and give him longer off the alcohol  and re-evaluate this problem.  Checking TSH and CBC and CMET today (fasting).  An After Visit Summary was printed and given to the patient.  FOLLOW UP: Return in about 4 weeks (around 07/06/2015) for f/u anxiety/med.  Signed:  Crissie Sickles, MD           06/08/2015

## 2015-06-08 NOTE — Progress Notes (Signed)
Pre visit review using our clinic review tool, if applicable. No additional management support is needed unless otherwise documented below in the visit note. 

## 2015-06-12 ENCOUNTER — Telehealth: Payer: Self-pay | Admitting: *Deleted

## 2015-06-12 ENCOUNTER — Other Ambulatory Visit: Payer: Self-pay | Admitting: *Deleted

## 2015-06-12 MED ORDER — LISINOPRIL 10 MG PO TABS: 10.0000 mg | ORAL_TABLET | Freq: Every day | ORAL | Status: DC

## 2015-06-12 MED ORDER — CITALOPRAM HYDROBROMIDE 20 MG PO TABS: 20.0000 mg | ORAL_TABLET | Freq: Every day | ORAL | Status: DC

## 2015-06-12 NOTE — Telephone Encounter (Signed)
Pt LMOM on 06/12/15 at 10:58am requesting a call back in regards to his labs and new medication.   I spoke to pt and advised him of his lab results, he voiced understanding.   He stated that he did not tolerate the duloxetine well. He stated that he only took it one day and it made him feel very dizzy and jittery "like he drunk a lot of coffee or caffeine). He stated that he wasn't sue if this was normal and he should just continue taking the medication. Please advise. Thanks.

## 2015-06-12 NOTE — Telephone Encounter (Signed)
OK, stop duloxetine. I sent in a rx for citalopram for him to start.

## 2015-06-12 NOTE — Telephone Encounter (Signed)
RF request for lisinopril LOV: 05/25/15 Next ov: 07/05/15 Last written: 02/25/15 #30 w/ 1RF

## 2015-06-13 NOTE — Telephone Encounter (Signed)
Left message for pt to call back  °

## 2015-06-14 NOTE — Telephone Encounter (Signed)
Pt advised and voiced understanding.   

## 2015-06-15 ENCOUNTER — Ambulatory Visit: Payer: BLUE CROSS/BLUE SHIELD | Admitting: Family Medicine

## 2015-07-05 ENCOUNTER — Ambulatory Visit: Payer: BLUE CROSS/BLUE SHIELD | Admitting: Family Medicine

## 2015-07-28 ENCOUNTER — Ambulatory Visit (INDEPENDENT_AMBULATORY_CARE_PROVIDER_SITE_OTHER): Payer: BLUE CROSS/BLUE SHIELD | Admitting: Family Medicine

## 2015-07-28 ENCOUNTER — Encounter: Payer: Self-pay | Admitting: Family Medicine

## 2015-07-28 VITALS — BP 144/80 | HR 92 | Temp 98.1°F | Resp 16 | Ht 69.75 in | Wt 270.5 lb

## 2015-07-28 DIAGNOSIS — F1010 Alcohol abuse, uncomplicated: Secondary | ICD-10-CM | POA: Diagnosis not present

## 2015-07-28 DIAGNOSIS — F411 Generalized anxiety disorder: Secondary | ICD-10-CM | POA: Diagnosis not present

## 2015-07-28 DIAGNOSIS — I10 Essential (primary) hypertension: Secondary | ICD-10-CM

## 2015-07-28 NOTE — Patient Instructions (Signed)
Check your blood pressure and heart rate once a day and write these numbers down for the next 2 weeks. Call our office in 2 weeks to report these numbers to my nurse Nira Conn).

## 2015-07-28 NOTE — Progress Notes (Signed)
Pre visit review using our clinic review tool, if applicable. No additional management support is needed unless otherwise documented below in the visit note. 

## 2015-07-28 NOTE — Progress Notes (Signed)
OFFICE VISIT  07/28/2015   CC:  Chief Complaint  Patient presents with  . Follow-up    Anxiety medication   HPI:    Patient is a 40 y.o. Caucasian male who presents for 6 week f/u GAD. Started duloxetine 15m qd last visit, and he was going to try to decrease use of his clonazepam. He has a hx of ETOH abuse and had quit 04/2015.  Duloxetine: felt jittery and hyped up when he took it.  Citalopram had done the same thing. Pt states his mood is good.  Just stressed at work. He has been drinking beer: says 3-4 beers 2-3 days a week.  Says he usually drinks to calm himself from his anxiety. Taking clonazepam 1-2 times per day.  No bp monitoring at home.  He has a wrist cuff at home, though.   Past Medical History  Diagnosis Date  . Psoriasis   . Anxiety   . HTN (hypertension)   . Insomnia   . Hx of colonic polyp - ssp 02/11/2014  . Anal fissure   . Hyperlipidemia   . NASH (nonalcoholic steatohepatitis) 2015; 2016    CT abd showed fatty liver+ liver enzymes mildly elevated.  .Marland KitchenHistory of kidney stones     [facility]  . Abnormal weight gain 05/25/2015  . Alcohol abuse 2016/2017    Pt states he was self medicating his anxiety.  Quit 04/2015.    Past Surgical History  Procedure Laterality Date  . Right ankle      2014  . Colonoscopy w/ biopsies  02/03/14    Cecal polyp (sessile serrated polyp w/out dysplasia) and posterior anal fissure; repeat TCS 2020 per Dr. GCarlean Purl . Laparoscopic appendectomy N/A 02/24/2015    Procedure: APPENDECTOMY LAPAROSCOPIC;  Surgeon: DAlphonsa Overall MD;  Location: [facility];  Service: General;  Laterality: N/A;    Outpatient Prescriptions Prior to Visit  Medication Sig Dispense Refill  . clonazePAM (KLONOPIN) 0.5 MG tablet Take 1 tablet (0.5 mg total) by mouth 3 (three) times daily as needed for anxiety. One tablet 3 times daily 100 tablet 1  . Eszopiclone 3 MG TABS Take 3 mg by mouth at bedtime. Take immediately before bedtime    . lisinopril  (PRINIVIL,ZESTRIL) 10 MG tablet Take 1 tablet (10 mg total) by mouth daily. 30 tablet 11  . meloxicam (MOBIC) 15 MG tablet Take 15 mg by mouth daily as needed for pain.    . Multiple Vitamins-Minerals (MULTIVITAMIN GUMMIES ADULT PO) Take 2 each by mouth daily.    . citalopram (CELEXA) 20 MG tablet Take 1 tablet (20 mg total) by mouth daily. (Patient not taking: Reported on 07/28/2015) 30 tablet 1  . DULoxetine (CYMBALTA) 30 MG capsule 1 tab po qd (Patient not taking: Reported on 07/28/2015) 30 capsule 1   No facility-administered medications prior to visit.    No Known Allergies  ROS As per HPI  PE: Blood pressure 144/80, pulse 92, temperature 98.1 F (36.7 C), temperature source Oral, resp. rate 16, height 5' 9.75" (1.772 m), weight 270 lb 8 oz (122.698 kg), SpO2 96 %. Manual bp recheck: 144/80 Gen: Alert, well appearing.  Patient is oriented to person, place, time, and situation. AFFECT: pleasant, lucid thought and speech. CV: RRR, no m/r/g.   LUNGS: CTA bilat, nonlabored resps, good aeration in all lung fields. EXT: no clubbing, cyanosis, or edema.   LABS:  Lab Results  Component Value Date   TSH 1.03 06/08/2015   Lab Results  Component  Value Date   WBC 15.0* 02/24/2015   HGB 15.3 02/24/2015   HCT 45.0 02/24/2015   MCV 92.6 02/24/2015   PLT 182 02/24/2015   Lab Results  Component Value Date   CREATININE 1.03 06/08/2015   BUN 13 06/08/2015   NA 137 06/08/2015   K 4.1 06/08/2015   CL 104 06/08/2015   CO2 26 06/08/2015   Lab Results  Component Value Date   ALT 95* 06/08/2015   AST 55* 06/08/2015   ALKPHOS 68 06/08/2015   BILITOT 0.5 06/08/2015   Lab Results  Component Value Date   CHOL 213* 06/08/2015   Lab Results  Component Value Date   HDL 38.50* 06/08/2015   Lab Results  Component Value Date   LDLCALC 141* 06/08/2015   Lab Results  Component Value Date   TRIG 166.0* 06/08/2015   Lab Results  Component Value Date   CHOLHDL 6 06/08/2015    IMPRESSION AND PLAN:  1) GAD; intolerant of SSRI and SNRI trials. Benzo's helping with prn use and he is using this med appropriately. Refer to psychiatrist to make sure I have the right dx, and if so maybe they can come up with additional anti-anxiety treatment.  2) Alcohol abuse: currently drinking a reasonable amount of beer, no abuse.  However, he would be best served to drink no alcohol given his hx of NASH.   We'll recheck CMET at f/u in 6 mo.  3) HTN: control ok but want to keep a closer eye on it. Instructions: Check your blood pressure and heart rate once a day and write these numbers down for the next 2 weeks. Call our office in 2 weeks to report these numbers to my nurse Nira Conn).  An After Visit Summary was printed and given to the patient.  FOLLOW UP: Return in about 6 months (around 01/27/2016) for routine chronic illness f/u.  Signed:  Crissie Sickles, MD           07/28/2015

## 2015-07-31 DIAGNOSIS — F411 Generalized anxiety disorder: Secondary | ICD-10-CM | POA: Diagnosis not present

## 2015-08-07 DIAGNOSIS — M25562 Pain in left knee: Secondary | ICD-10-CM | POA: Diagnosis not present

## 2015-08-11 DIAGNOSIS — F411 Generalized anxiety disorder: Secondary | ICD-10-CM | POA: Diagnosis not present

## 2015-10-19 DIAGNOSIS — F411 Generalized anxiety disorder: Secondary | ICD-10-CM | POA: Diagnosis not present

## 2016-01-23 ENCOUNTER — Encounter: Payer: Self-pay | Admitting: Family Medicine

## 2016-01-23 ENCOUNTER — Ambulatory Visit (INDEPENDENT_AMBULATORY_CARE_PROVIDER_SITE_OTHER): Payer: Self-pay | Admitting: Family Medicine

## 2016-01-23 VITALS — BP 157/89 | HR 109 | Temp 97.4°F | Resp 16 | Ht 69.75 in | Wt 277.5 lb

## 2016-01-23 DIAGNOSIS — R112 Nausea with vomiting, unspecified: Secondary | ICD-10-CM | POA: Diagnosis not present

## 2016-01-23 DIAGNOSIS — Z23 Encounter for immunization: Secondary | ICD-10-CM | POA: Diagnosis not present

## 2016-01-23 DIAGNOSIS — R202 Paresthesia of skin: Secondary | ICD-10-CM

## 2016-01-23 DIAGNOSIS — R739 Hyperglycemia, unspecified: Secondary | ICD-10-CM | POA: Diagnosis not present

## 2016-01-23 DIAGNOSIS — F418 Other specified anxiety disorders: Secondary | ICD-10-CM

## 2016-01-23 DIAGNOSIS — F411 Generalized anxiety disorder: Secondary | ICD-10-CM

## 2016-01-23 DIAGNOSIS — K7581 Nonalcoholic steatohepatitis (NASH): Secondary | ICD-10-CM

## 2016-01-23 DIAGNOSIS — F1010 Alcohol abuse, uncomplicated: Secondary | ICD-10-CM

## 2016-01-23 LAB — COMPREHENSIVE METABOLIC PANEL
ALBUMIN: 4.7 g/dL (ref 3.5–5.2)
ALT: 216 U/L — ABNORMAL HIGH (ref 0–53)
AST: 265 U/L — ABNORMAL HIGH (ref 0–37)
Alkaline Phosphatase: 75 U/L (ref 39–117)
BUN: 9 mg/dL (ref 6–23)
CALCIUM: 9.4 mg/dL (ref 8.4–10.5)
CHLORIDE: 101 meq/L (ref 96–112)
CO2: 22 meq/L (ref 19–32)
Creatinine, Ser: 0.98 mg/dL (ref 0.40–1.50)
GFR: 90.28 mL/min (ref >60.00)
Glucose, Bld: 203 mg/dL — ABNORMAL HIGH (ref 70–99)
POTASSIUM: 3.8 meq/L (ref 3.5–5.1)
Sodium: 136 mEq/L (ref 135–145)
Total Bilirubin: 0.8 mg/dL (ref 0.2–1.2)
Total Protein: 7.7 g/dL (ref 6.0–8.3)

## 2016-01-23 LAB — VITAMIN B12: Vitamin B-12: 428 pg/mL (ref 211–911)

## 2016-01-23 LAB — HEMOGLOBIN A1C: Hgb A1c MFr Bld: 6.7 % — ABNORMAL HIGH (ref 4.6–6.5)

## 2016-01-23 MED ORDER — CLONAZEPAM 1 MG PO TABS: 1.0000 mg | ORAL_TABLET | Freq: Two times a day (BID) | ORAL | 1 refills | Status: DC | PRN

## 2016-01-23 MED ORDER — PROMETHAZINE HCL 12.5 MG PO TABS
ORAL_TABLET | ORAL | 0 refills | Status: DC
Start: 2016-01-23 — End: 2016-09-03

## 2016-01-23 NOTE — Progress Notes (Signed)
Pre visit review using our clinic review tool, if applicable. No additional management support is needed unless otherwise documented below in the visit note. 

## 2016-01-23 NOTE — Progress Notes (Signed)
OFFICE VISIT  01/23/2016   CC:  Chief Complaint  Patient presents with  . Numbness    hands and feet x 5-6 days, only when he wakes up  . Headache    x 5-6 days  . Nausea    x 5-6 days   HPI:    Patient is a 40 y.o.  male who presents for 6 mo f/u HTN, NASH, GAD, hx of alcohol abuse. However, he feels ill today so discussed his acute complaints:  Last 5-6 days he wakes up feeling tingling in hands, feet, sometimes also in facial area. Resolves after a few minutes. Also n/v last 3-4 days.  Some loose BM's: one per day the last few days.  No fever.  No abd pains.  Checking bp tid last couple weeks has been 120s/80s.  Says he hasn't had an alcoholic drink in the last 5-6 d, and prior to that he was drinking 5-6 beers per day.  Feeling jittery somewhat.  Poor sleep lately. Still working.  He is stressed b/c he is going to [place] in a few days and hopes to get engaged there. Using glucometer to check glucoses lately, says they were in the mid 200s.  Hyperglycemia has never been a problem for him in the past but he had been feeling jittery on and off so he borrowed his mom's glucometer and strips to monitor his glucoses.   Past Medical History:  Diagnosis Date  . Abnormal weight gain 05/25/2015  . Alcohol abuse 2016/2017   Pt states he was self medicating his anxiety.  Quit 04/2015.  Marland Kitchen Anal fissure   . Anxiety   . History of kidney stones    [facility]  . HTN (hypertension)   . Hx of colonic polyp - ssp 02/11/2014  . Hyperlipidemia   . Insomnia   . NASH (nonalcoholic steatohepatitis) 2015; 2016   CT abd showed fatty liver+ liver enzymes mildly elevated.  . Psoriasis     Past Surgical History:  Procedure Laterality Date  . COLONOSCOPY W/ BIOPSIES  02/03/14   Cecal polyp (sessile serrated polyp w/out dysplasia) and posterior anal fissure; repeat TCS 2020 per Dr. Carlean Purl  . LAPAROSCOPIC APPENDECTOMY N/A 02/24/2015   Procedure: APPENDECTOMY LAPAROSCOPIC;   Surgeon: Alphonsa Overall, MD;  Location: [facility];  Service: General;  Laterality: N/A;  . right ankle     2014    Outpatient Medications Prior to Visit  Medication Sig Dispense Refill  . Eszopiclone 3 MG TABS Take 3 mg by mouth at bedtime. Take immediately before bedtime    . lisinopril (PRINIVIL,ZESTRIL) 10 MG tablet Take 1 tablet (10 mg total) by mouth daily. 30 tablet 11  . meloxicam (MOBIC) 15 MG tablet Take 15 mg by mouth daily as needed for pain.    . clonazePAM (KLONOPIN) 0.5 MG tablet Take 1 tablet (0.5 mg total) by mouth 3 (three) times daily as needed for anxiety. One tablet 3 times daily 100 tablet 1  . Multiple Vitamins-Minerals (MULTIVITAMIN GUMMIES ADULT PO) Take 2 each by mouth daily.     No facility-administered medications prior to visit.     No Known Allergies  ROS As per HPI  PE: Blood pressure (!) 157/89, pulse (!) 109, temperature 97.4 F (36.3 C), temperature source Temporal, resp. rate 16, height 5' 9.75" (1.772 m), weight 277 lb 8 oz (125.9 kg), SpO2 96 %. Gen: Alert, well appearing.  Patient is oriented to person, place, time, and situation. AFFECT: pleasant, lucid thought and  speech.  A bit anxious-appearing at times. VCB:SWHQ: no injection, icteris, swelling, or exudate.  EOMI, PERRLA. Mouth: lips without lesion/swelling.  Oral mucosa pink and moist. Oropharynx without erythema, exudate, or swelling.  CV: RRR, no m/r/g.   LUNGS: CTA bilat, nonlabored resps, good aeration in all lung fields. ABD: soft, NT, ND, BS normal.  No hepatospenomegaly or mass.  No bruits. EXT: no clubbing, cyanosis, or edema.   LABS:    Chemistry      Component Value Date/Time   NA 137 06/08/2015 1050   K 4.1 06/08/2015 1050   CL 104 06/08/2015 1050   CO2 26 06/08/2015 1050   BUN 13 06/08/2015 1050   CREATININE 1.03 06/08/2015 1050      Component Value Date/Time   CALCIUM 9.5 06/08/2015 1050   ALKPHOS 68 06/08/2015 1050   AST 55 (H) 06/08/2015 1050   ALT 95 (H) 06/08/2015  1050   BILITOT 0.5 06/08/2015 1050     Lab Results  Component Value Date   WBC 15.0 (H) 02/24/2015   HGB 15.3 02/24/2015   HCT 45.0 02/24/2015   MCV 92.6 02/24/2015   PLT 182 02/24/2015    IMPRESSION AND PLAN:  1) Nausea and vomiting: suspect this is part of his anxiety reaction, but also could be viral GE. No fever or abd pain and no significant diarrhea would seem to make viral GE less likely. Will treat symptomatically with phenergan 12.5, 1-2 q6h prn.  Therapeutic expectations and side effect profile of medication discussed today.  Patient's questions answered.  2) Paresthesias: once again, very likely a manifestation of his very significant anxiety. Will increase clonazepam to 1 mg bid prn.  He was encouraged to ABSTAIN from alcohol use. He needs SSRI but starting this today is too overwhelming for him.  Will discuss getting on SSRI at f/u visit in 1 mo. Check vit B12 level today.  3) Hyperglycemia: will confirm with CMET today as well as check Hba1c today. If matches home readings then he has DM 2 and needs to start medication.  4) NASH: checking LFTs today.  5) Alcohol abuse: he has stated he drinks to self medicate his anxiety.  He quits intermittently. Hard to know what to believe when pt tells me things about this problem.  An After Visit Summary was printed and given to the patient.  FOLLOW UP: Return in about 4 weeks (around 02/20/2016) for routine chronic illness f/u (30 min).  Signed:  Crissie Sickles, MD           01/23/2016

## 2016-01-24 ENCOUNTER — Encounter: Payer: Self-pay | Admitting: Family Medicine

## 2016-01-24 ENCOUNTER — Other Ambulatory Visit: Payer: Self-pay | Admitting: Family Medicine

## 2016-01-24 DIAGNOSIS — E119 Type 2 diabetes mellitus without complications: Secondary | ICD-10-CM

## 2016-01-24 LAB — CBC WITH DIFFERENTIAL/PLATELET
BASOS PCT: 0.5 % (ref 0.0–3.0)
Basophils Absolute: 0 10*3/uL (ref 0.0–0.1)
EOS ABS: 0.3 10*3/uL (ref 0.0–0.7)
EOS PCT: 4.1 % (ref 0.0–5.0)
HEMATOCRIT: 46.2 % (ref 39.0–52.0)
HEMOGLOBIN: 15.9 g/dL (ref 13.0–17.0)
LYMPHS PCT: 25.3 % (ref 12.0–46.0)
Lymphs Abs: 1.9 10*3/uL (ref 0.7–4.0)
MCHC: 34.4 g/dL (ref 30.0–36.0)
MCV: 94.7 fl (ref 78.0–100.0)
MONOS PCT: 5.2 % (ref 3.0–12.0)
Monocytes Absolute: 0.4 10*3/uL (ref 0.1–1.0)
Neutro Abs: 4.8 10*3/uL (ref 1.4–7.7)
Neutrophils Relative %: 64.9 % (ref 43.0–77.0)
Platelets: 145 10*3/uL — ABNORMAL LOW (ref 150.0–400.0)
RBC: 4.88 Mil/uL (ref 4.22–5.81)
RDW: 13.3 % (ref 11.5–15.5)
WBC: 7.4 10*3/uL (ref 4.0–10.5)

## 2016-02-28 ENCOUNTER — Ambulatory Visit (INDEPENDENT_AMBULATORY_CARE_PROVIDER_SITE_OTHER): Payer: BLUE CROSS/BLUE SHIELD | Admitting: Family Medicine

## 2016-02-28 DIAGNOSIS — Z0289 Encounter for other administrative examinations: Secondary | ICD-10-CM

## 2016-03-25 ENCOUNTER — Ambulatory Visit (HOSPITAL_BASED_OUTPATIENT_CLINIC_OR_DEPARTMENT_OTHER)
Admission: RE | Admit: 2016-03-25 | Discharge: 2016-03-25 | Disposition: A | Discharge status: Home / Self Care | Payer: BLUE CROSS/BLUE SHIELD | Source: Ambulatory Visit | Attending: Family Medicine | Admitting: Family Medicine

## 2016-03-25 ENCOUNTER — Encounter: Payer: Self-pay | Admitting: Family Medicine

## 2016-03-25 ENCOUNTER — Telehealth: Payer: Self-pay | Admitting: Family Medicine

## 2016-03-25 ENCOUNTER — Ambulatory Visit (INDEPENDENT_AMBULATORY_CARE_PROVIDER_SITE_OTHER): Payer: Self-pay | Admitting: Family Medicine

## 2016-03-25 VITALS — BP 151/97 | HR 108 | Temp 98.3°F | Resp 16 | Ht 69.75 in | Wt 279.2 lb

## 2016-03-25 DIAGNOSIS — E118 Type 2 diabetes mellitus with unspecified complications: Secondary | ICD-10-CM

## 2016-03-25 DIAGNOSIS — R143 Flatulence: Secondary | ICD-10-CM | POA: Insufficient documentation

## 2016-03-25 DIAGNOSIS — R740 Nonspecific elevation of levels of transaminase and lactic acid dehydrogenase [LDH]: Secondary | ICD-10-CM

## 2016-03-25 DIAGNOSIS — R7401 Elevation of levels of liver transaminase levels: Secondary | ICD-10-CM

## 2016-03-25 DIAGNOSIS — F1020 Alcohol dependence, uncomplicated: Secondary | ICD-10-CM

## 2016-03-25 DIAGNOSIS — F329 Major depressive disorder, single episode, unspecified: Secondary | ICD-10-CM

## 2016-03-25 DIAGNOSIS — F418 Other specified anxiety disorders: Secondary | ICD-10-CM

## 2016-03-25 DIAGNOSIS — R7989 Other specified abnormal findings of blood chemistry: Secondary | ICD-10-CM

## 2016-03-25 DIAGNOSIS — K824 Cholesterolosis of gallbladder: Secondary | ICD-10-CM | POA: Insufficient documentation

## 2016-03-25 DIAGNOSIS — K769 Liver disease, unspecified: Secondary | ICD-10-CM | POA: Insufficient documentation

## 2016-03-25 DIAGNOSIS — R945 Abnormal results of liver function studies: Secondary | ICD-10-CM

## 2016-03-25 DIAGNOSIS — F1010 Alcohol abuse, uncomplicated: Secondary | ICD-10-CM

## 2016-03-25 DIAGNOSIS — F419 Anxiety disorder, unspecified: Secondary | ICD-10-CM

## 2016-03-25 DIAGNOSIS — F32A Depression, unspecified: Secondary | ICD-10-CM

## 2016-03-25 MED ORDER — GLIPIZIDE ER 5 MG PO TB24: 5.0000 mg | ORAL_TABLET | Freq: Every day | ORAL | 6 refills | Status: DC

## 2016-03-25 NOTE — Telephone Encounter (Signed)
OK.  U/S of abd ordered for [facility].

## 2016-03-25 NOTE — Telephone Encounter (Signed)
Please advise. Thanks.  

## 2016-03-25 NOTE — Telephone Encounter (Signed)
SW pt and he stated that he would like to go to [place].

## 2016-03-25 NOTE — Progress Notes (Signed)
OFFICE VISIT  03/25/2016   CC:  Chief Complaint  Patient presents with  . Follow-up    diabetes  . Alcohol Problem    wants to discuss possible medications   HPI:    Patient is a 41 y.o.  male who presents for anxiety related to recent dx of DM and elevated LFTs. Has seen psych provider, was rx'd equetro 149m qd and propranolol 158mtid for mood/anxiety. He did not start these meds b/c he was still drinking alcohol and did not want to mix it. [place] 4 [place]  Feeling more anxious, has trouble sleeping.  No n/v/abd pain.   He is taking clonaz q12h prn and says he has about 15 left in bottle and he says he thinks this will last another month or so.  He says he has psych f/u set for about a week from now.  New dx DM 2 12/2015: hasn't made nutritionist appt yet but plans to.  He says he has made some positive dietary changes on his own.  Checks glucose qd: 120-140 range. He decided not to get glipizide when I recommended it back in November but he is willing to get this now.  He is interested in getting abd u/s to further evaluate his elevated LFTs that we found on labs 12/2015, so I've ordered this and it will be getting scheduled soon.   Past Medical History:  Diagnosis Date  . Abnormal weight gain 05/25/2015  . Alcohol abuse 2016/2017   Pt states he was self medicating his anxiety.  Quit 04/2015.  . Marland Kitchennal fissure   . Anxiety   . Diabetes [place]   New dx 12/2015  . History of kidney stones    [facility]  . HTN (hypertension)   . Hx of colonic polyp - ssp 02/11/2014  . Hyperlipidemia   . Insomnia   . NASH (nonalcoholic steatohepatitis) 2015; 2016   CT abd showed fatty liver+ liver enzymes mildly elevated.  . Psoriasis     Past Surgical History:  Procedure Laterality Date  . COLONOSCOPY W/ BIOPSIES  02/03/14   Cecal polyp (sessile serrated polyp w/out dysplasia) and posterior anal fissure; repeat TCS 2020 per Dr. GeCarlean Purl.  LAPAROSCOPIC APPENDECTOMY N/A 02/24/2015   Procedure: APPENDECTOMY LAPAROSCOPIC;  Surgeon: DaAlphonsa OverallMD;  Location: [facility];  Service: General;  Laterality: N/A;  . right ankle     2014    Outpatient Medications Prior to Visit  Medication Sig Dispense Refill  . clonazePAM (KLONOPIN) 1 MG tablet Take 1 tablet (1 mg total) by mouth 2 (two) times daily as needed for anxiety. 60 tablet 1  . Eszopiclone 3 MG TABS Take 3 mg by mouth at bedtime. Take immediately before bedtime    . lisinopril (PRINIVIL,ZESTRIL) 10 MG tablet Take 1 tablet (10 mg total) by mouth daily. 30 tablet 11  . meloxicam (MOBIC) 15 MG tablet Take 15 mg by mouth daily as needed for pain.    . promethazine (PHENERGAN) 12.5 MG tablet 1-2 tabs po q6h prn nausea/vomiting 30 tablet 0  . Multiple Vitamins-Minerals (MULTIVITAMIN GUMMIES ADULT PO) Take 2 each by mouth daily.     No facility-administered medications prior to visit.     No Known Allergies  ROS As per HPI  PE: Blood pressure (!) 151/97, pulse (!) 108, temperature 98.3 F (36.8 C), temperature source Oral, resp. rate 16, height 5' 9.75" (1.772 m), weight 279 lb 4 oz (126.7 kg), SpO2 97 %.  Wt Readings from Last 2 Encounters:  03/25/16 279 lb 4 oz (126.7 kg)  01/23/16 277 lb 8 oz (125.9 kg)    Gen: alert, oriented x 4, affect pleasant.  Lucid thinking and conversation noted. HEENT: PERRLA, EOMI.   Neck: no LAD, mass, or thyromegaly. CV: RRR, no m/r/g LUNGS: CTA bilat, nonlabored. NEURO: no tremor or tics noted on observation.  Coordination intact. CN 2-12 grossly intact bilaterally, strength 5/5 in all extremeties.  No ataxia.   LABS:    Chemistry      Component Value Date/Time   NA 136 01/23/2016 0842   K 3.8 01/23/2016 0842   CL 101 01/23/2016 0842   CO2 22 01/23/2016 0842   BUN 9 01/23/2016 0842   CREATININE 0.98 01/23/2016 0842      Component Value Date/Time   CALCIUM 9.4 01/23/2016 0842   ALKPHOS 75 01/23/2016 0842   AST 265 (H)  01/23/2016 0842   ALT 216 (H) 01/23/2016 0842   BILITOT 0.8 01/23/2016 0842      Lab Results  Component Value Date   HGBA1C 6.7 (H) 01/23/2016    IMPRESSION AND PLAN:  1) Anxiety and depression:  I recommended he take the equetro and propranolol as rx'd by his psychiatric provider.   He also has clonaz 58m to take q12h prn---no new rx for this was given today. He'll f/u with psych provider in about 1 week.  2) DM 2; new dx 12/2015. Continue with diet. He'll make appt with nutritionist. I sent in eRx for glipizide ER [id number]. Therapeutic expectations and side effect profile of medication discussed today.  Patient's questions answered.  3) Alcoholism: he quit 4 d/a and does not seem to be in any severe withdrawal at this time. I reminded him of taking the clonaz 27m62m12h prn.  4) Elevated LFTs: suspected due to alcoholic hepatitis.   At next lab draw in 1 mo we'll check Hep B and Hep C screening.  An After Visit Summary was printed and given to the patient.  FOLLOW UP: Return in about 4 weeks (around 04/22/2016) for f/u DM 2.  Signed:  PhiCrissie SicklesD           03/25/2016

## 2016-03-25 NOTE — Progress Notes (Signed)
Pre visit review using our clinic review tool, if applicable. No additional management support is needed unless otherwise documented below in the visit note. 

## 2016-03-25 NOTE — Telephone Encounter (Signed)
OK, I'll order abdominal ultrasound. What imaging place does he prefer?

## 2016-03-25 NOTE — Telephone Encounter (Signed)
Patient called & asked about getting an ultrasound. He said it wasn't recommended to him in his 11/17 OV.

## 2016-03-25 NOTE — Patient Instructions (Signed)
Take your propranolol 69m three times per day. Take your equetro 100 mg once per day.  Gake glipizide every morning with breakfast.

## 2016-03-26 ENCOUNTER — Encounter: Payer: Self-pay | Admitting: Family Medicine

## 2016-03-26 NOTE — Telephone Encounter (Signed)
U/S has been done.

## 2016-03-29 ENCOUNTER — Telehealth: Payer: Self-pay | Admitting: Family Medicine

## 2016-03-29 NOTE — Telephone Encounter (Signed)
Need new subscriber ID# . Patient stated at [facility] that he would contact his insurance agent to get the new information. Insurance will not verify & pt does not have new card.

## 2016-04-01 DIAGNOSIS — F411 Generalized anxiety disorder: Secondary | ICD-10-CM | POA: Diagnosis not present

## 2016-04-09 ENCOUNTER — Encounter: Payer: BLUE CROSS/BLUE SHIELD | Attending: Family Medicine | Admitting: Dietician

## 2016-04-09 ENCOUNTER — Encounter: Payer: Self-pay | Admitting: Dietician

## 2016-04-09 DIAGNOSIS — Z713 Dietary counseling and surveillance: Secondary | ICD-10-CM | POA: Diagnosis not present

## 2016-04-09 DIAGNOSIS — E119 Type 2 diabetes mellitus without complications: Secondary | ICD-10-CM | POA: Insufficient documentation

## 2016-04-09 NOTE — Progress Notes (Signed)
Patient was seen on 04/09/16 for the first of a series of three diabetes self-management courses at [facility].  Patient Education Plan per assessed needs and concerns is to attend four course education program for Diabetes Self Management Education.  The following learning objectives were met by the patient during this class:  Describe diabetes  State some common risk factors for diabetes  Defines the role of glucose and insulin  Identifies type of diabetes and pathophysiology  Describe the relationship between diabetes and cardiovascular risk  State the members of the Healthcare Team  States the rationale for glucose monitoring  State when to test glucose  State their individual Target Range  State the importance of logging glucose readings  Describe how to interpret glucose readings  Identifies A1C target  Explain the correlation between A1c and eAG values  State symptoms and treatment of high blood glucose  State symptoms and treatment of low blood glucose  Explain proper technique for glucose testing  Identifies proper sharps disposal  Handouts given during class include:  Living Well with Diabetes book  Carb Counting and Meal Planning book  Meal Plan Card  Carbohydrate guide  Meal planning worksheet  Low Sodium Flavoring Tips  The diabetes portion plate  Y4M to eAG Conversion Chart  Diabetes Medications  Diabetes Recommended Care Schedule  Support Group  Diabetes Success Plan  Core Class Satisfaction Survey  Follow-Up Plan:  Attend core 2

## 2016-04-16 ENCOUNTER — Encounter: Payer: BLUE CROSS/BLUE SHIELD | Admitting: Dietician

## 2016-04-16 DIAGNOSIS — E119 Type 2 diabetes mellitus without complications: Secondary | ICD-10-CM

## 2016-04-16 DIAGNOSIS — Z713 Dietary counseling and surveillance: Secondary | ICD-10-CM | POA: Diagnosis not present

## 2016-04-16 NOTE — Progress Notes (Signed)

## 2016-04-19 ENCOUNTER — Encounter: Payer: Self-pay | Admitting: Family Medicine

## 2016-04-19 ENCOUNTER — Ambulatory Visit (INDEPENDENT_AMBULATORY_CARE_PROVIDER_SITE_OTHER): Payer: BLUE CROSS/BLUE SHIELD | Admitting: Family Medicine

## 2016-04-19 VITALS — BP 111/74 | HR 64 | Temp 98.1°F | Resp 16 | Wt 271.5 lb

## 2016-04-19 DIAGNOSIS — I10 Essential (primary) hypertension: Secondary | ICD-10-CM

## 2016-04-19 DIAGNOSIS — E119 Type 2 diabetes mellitus without complications: Secondary | ICD-10-CM

## 2016-04-19 DIAGNOSIS — K7010 Alcoholic hepatitis without ascites: Secondary | ICD-10-CM

## 2016-04-19 LAB — HEMOGLOBIN A1C: Hgb A1c MFr Bld: 6.5 % (ref 4.6–6.5)

## 2016-04-19 LAB — HEPATIC FUNCTION PANEL
ALT: 68 U/L — AB (ref 0–53)
AST: 43 U/L — AB (ref 0–37)
Albumin: 4.5 g/dL (ref 3.5–5.2)
Alkaline Phosphatase: 71 U/L (ref 39–117)
BILIRUBIN DIRECT: 0.1 mg/dL (ref 0.0–0.3)
TOTAL PROTEIN: 7.3 g/dL (ref 6.0–8.3)
Total Bilirubin: 0.5 mg/dL (ref 0.2–1.2)

## 2016-04-19 NOTE — Progress Notes (Signed)
OFFICE VISIT  04/19/2016   CC:  Chief Complaint  Patient presents with  . Diabetes    follow up   HPI:    Patient is a 41 y.o.  male who presents for 4 week f/u DM 2, history of alcoholism and mild hepatitis (fatty liver w/out cirrhosis on recent abd u/s), and HTN. Reports doing well.  No abd pain.      DM: has been seeing nutritionist, says this is going well. Exercise: walks, strength training. No home gluc monitoring b/c ran out of strips.  Was running 98-102 before running out.  Says he is taking glipizide as rx'd. Feels occ mild/brief dizziness but no clear pattern to food ingestion or med administration. Feet: occ feels tingling in toes at end of the day.  No burning or numbness.  HTN: Home bp monitoring: 103-106/63-65.    He continues to abstain from alcohol: none since 03/21/15.  ROS: no HAs, no SOB, no palpitations, no cough, no fevers, no LE swelling, no focal weakness or paresthesias.  Past Medical History:  Diagnosis Date  . Abnormal weight gain 05/25/2015  . Alcohol abuse 2016/2017   Pt states he was self medicating his anxiety.  Quit 04/2015.  Marland Kitchen Anal fissure   . Anxiety   . Diabetes mellitus ([place]) 12/2015   New dx 12/2015  . Hepatic steatosis 2015; 2016; 02/2016   +Alcohol has played a role.  CT abd showed fatty liver+ liver enzymes mildly elevated.  Abd u/s 03/26/16 showed fatty liver, small GB polyps, o/w normal.  . History of kidney stones    [facility]  . HTN (hypertension)   . Hx of colonic polyp - ssp 02/11/2014  . Hyperlipidemia   . Insomnia   . Psoriasis     Past Surgical History:  Procedure Laterality Date  . COLONOSCOPY W/ BIOPSIES  02/03/14   Cecal polyp (sessile serrated polyp w/out dysplasia) and posterior anal fissure; repeat TCS 2020 per Dr. Carlean Purl  . LAPAROSCOPIC APPENDECTOMY N/A 02/24/2015   Procedure: APPENDECTOMY LAPAROSCOPIC;  Surgeon: Alphonsa Overall, MD;  Location: [facility];  Service: General;  Laterality: N/A;  . right ankle     2014    Outpatient Medications Prior to Visit  Medication Sig Dispense Refill  . Carbamazepine (EQUETRO) 100 MG CP12 12 hr capsule Take 100 mg by mouth daily. Rx'd by psych    . clonazePAM (KLONOPIN) 1 MG tablet Take 1 tablet (1 mg total) by mouth 2 (two) times daily as needed for anxiety. 60 tablet 1  . Eszopiclone 3 MG TABS Take 3 mg by mouth at bedtime. Take immediately before bedtime    . glipiZIDE (GLUCOTROL XL) 5 MG 24 hr tablet Take 1 tablet (5 mg total) by mouth daily with breakfast. 30 tablet 6  . lisinopril (PRINIVIL,ZESTRIL) 10 MG tablet Take 1 tablet (10 mg total) by mouth daily. 30 tablet 11  . meloxicam (MOBIC) 15 MG tablet Take 15 mg by mouth daily as needed for pain.    . promethazine (PHENERGAN) 12.5 MG tablet 1-2 tabs po q6h prn nausea/vomiting 30 tablet 0  . propranolol (INDERAL) 10 MG tablet Take 10 mg by mouth 3 (three) times daily. Rx'd by psych     No facility-administered medications prior to visit.     No Known Allergies  ROS As per HPI  PE: Blood pressure 111/74, pulse 64, temperature 98.1 F (36.7 C), temperature source Oral, resp. rate 16, weight 271 lb 8 oz (123.2 kg), SpO2 97 %. Gen: Alert,  well appearing.  Patient is oriented to person, place, time, and situation. Neck: supple/nontender.  No LAD, mass, or TM.  Carotid pulses 2+ bilaterally, without bruits. Foot exam - bilateral normal; no swelling, tenderness or skin or vascular lesions. Color and temperature is normal. Sensation is intact. Peripheral pulses are palpable. Toenails are normal.  LABS:  Lab Results  Component Value Date   HGBA1C 6.7 (H) 01/23/2016     Chemistry      Component Value Date/Time   NA 136 01/23/2016 0842   K 3.8 01/23/2016 0842   CL 101 01/23/2016 0842   CO2 22 01/23/2016 0842   BUN 9 01/23/2016 0842   CREATININE 0.98 01/23/2016 0842      Component Value Date/Time   CALCIUM 9.4 01/23/2016 0842   ALKPHOS 75 01/23/2016 0842   AST 265 (H) 01/23/2016 0842   ALT 216  (H) 01/23/2016 0842   BILITOT 0.8 01/23/2016 0842     IMPRESSION AND PLAN:  1) DM 2, new dx.  Doing well on daily glipizide xl. Feet exam normal today. We'll go over need for diab retinpthy exam again at future f/u visit. Recheck hbA1c today.  2) Chronic alcoholic hepatitis: he has abstained from drinking alcohol now for 1 full month. He's doing ok with this. Will recheck hepatic panel today and also screen for hep C and Hep B.  3) HTN: bps very good.  The current medical regimen is effective;  continue present plan and medications.  An After Visit Summary was printed and given to the patient.  FOLLOW UP: Return in about 3 months (around 07/17/2016) for routine chronic illness f/u.  Signed:  Crissie Sickles, MD           04/19/2016

## 2016-04-19 NOTE — Progress Notes (Signed)
Pre visit review using our clinic review tool, if applicable. No additional management support is needed unless otherwise documented below in the visit note. 

## 2016-04-20 LAB — HEPATITIS C ANTIBODY: HCV AB: NEGATIVE

## 2016-04-20 LAB — HEPATITIS B SURFACE ANTIGEN: HEP B S AG: NEGATIVE

## 2016-04-21 ENCOUNTER — Encounter: Payer: Self-pay | Admitting: Family Medicine

## 2016-04-23 ENCOUNTER — Encounter: Payer: BLUE CROSS/BLUE SHIELD | Admitting: Dietician

## 2016-04-23 DIAGNOSIS — E119 Type 2 diabetes mellitus without complications: Secondary | ICD-10-CM | POA: Diagnosis not present

## 2016-04-23 DIAGNOSIS — Z713 Dietary counseling and surveillance: Secondary | ICD-10-CM | POA: Diagnosis not present

## 2016-04-23 NOTE — Progress Notes (Signed)
Patient was seen on 04/23/16 for the third of a series of three diabetes self-management courses at the [facility].   Catalina Gravel the amount of activity recommended for healthy living . Describe activities suitable for individual needs . Identify ways to regularly incorporate activity into daily life . Identify barriers to activity and ways to over come these barriers  Identify diabetes medications being personally used and their primary action for lowering glucose and possible side effects . Describe role of stress on blood glucose and develop strategies to address psychosocial issues . Identify diabetes complications and ways to prevent them  Explain how to manage diabetes during illness . Evaluate success in meeting personal goal . Establish 2-3 goals that they will plan to diligently work on until they return for the  71-monthfollow-up visit  Goals:   To help manage stress I will  Play golf at least 3 times a week  Your patient has identified these potential barriers to change:  Stress  Your patient has identified their diabetes self-care support plan as  Family Support Plan:  Attend Support Group as desired

## 2016-04-30 DIAGNOSIS — F411 Generalized anxiety disorder: Secondary | ICD-10-CM | POA: Diagnosis not present

## 2016-06-03 DIAGNOSIS — L218 Other seborrheic dermatitis: Secondary | ICD-10-CM | POA: Diagnosis not present

## 2016-06-03 DIAGNOSIS — L905 Scar conditions and fibrosis of skin: Secondary | ICD-10-CM | POA: Diagnosis not present

## 2016-06-18 ENCOUNTER — Encounter: Payer: Self-pay | Admitting: Family Medicine

## 2016-06-19 MED ORDER — CLONAZEPAM 1 MG PO TABS: 1.0000 mg | ORAL_TABLET | Freq: Two times a day (BID) | ORAL | 1 refills | Status: DC | PRN

## 2016-06-19 NOTE — Telephone Encounter (Signed)
Per Dr. Anitra Lauth pt should not drink any alcohol. He stated that he strongly recommends that pt does not drink. Pt advised and voiced understanding.  Rx faxed.

## 2016-06-19 NOTE — Telephone Encounter (Signed)
Please also see mychart message. Thanks.  RF request for clonozapam LOV: 04/19/16 Next ov: 07/16/16 Last written: 01/23/16 #60 w/ 1RF

## 2016-06-25 ENCOUNTER — Telehealth: Payer: Self-pay | Admitting: Family Medicine

## 2016-06-25 MED ORDER — LISINOPRIL 10 MG PO TABS: 10.0000 mg | ORAL_TABLET | Freq: Every day | ORAL | 3 refills | Status: DC

## 2016-06-25 NOTE — Telephone Encounter (Signed)
Patient going out of town tomorrow & needs to refill Lisinopril. He has 5 pills left. [facility].

## 2016-06-25 NOTE — Telephone Encounter (Signed)
Rx filled per [facility] Protocol.  Pt advised.

## 2016-07-16 ENCOUNTER — Ambulatory Visit: Payer: BLUE CROSS/BLUE SHIELD | Admitting: Family Medicine

## 2016-07-29 ENCOUNTER — Encounter (HOSPITAL_BASED_OUTPATIENT_CLINIC_OR_DEPARTMENT_OTHER): Payer: Self-pay | Admitting: Emergency Medicine

## 2016-07-29 ENCOUNTER — Emergency Department (HOSPITAL_BASED_OUTPATIENT_CLINIC_OR_DEPARTMENT_OTHER)
Admission: EM | Admit: 2016-07-29 | Discharge: 2016-07-29 | Disposition: A | Discharge status: Home / Self Care | Payer: BLUE CROSS/BLUE SHIELD | Attending: Emergency Medicine | Admitting: Emergency Medicine

## 2016-07-29 DIAGNOSIS — Z7984 Long term (current) use of oral hypoglycemic drugs: Secondary | ICD-10-CM | POA: Diagnosis not present

## 2016-07-29 DIAGNOSIS — M79662 Pain in left lower leg: Secondary | ICD-10-CM | POA: Insufficient documentation

## 2016-07-29 DIAGNOSIS — E119 Type 2 diabetes mellitus without complications: Secondary | ICD-10-CM | POA: Diagnosis not present

## 2016-07-29 DIAGNOSIS — I10 Essential (primary) hypertension: Secondary | ICD-10-CM | POA: Diagnosis not present

## 2016-07-29 NOTE — ED Provider Notes (Signed)
[facility] Provider Note   CSN: [id number] Arrival date & time: 07/29/16  1825  By signing my name below, I, Tony [name], attest that this documentation has been prepared under the direction and in the presence of [name], MD. Electronically Signed: Jeanell [name], Scribe. 07/29/2016. 6:40 PM.  History   Chief Complaint Chief Complaint  Patient presents with  . Leg Pain    The history is provided by the patient. No language interpreter was used.   HPI Comments: Tony [name] is a 41 y.o. male who presents to the Emergency Department complaining of a left calf "bump" That he noticed last night. He reports he feels like a small vein is protruding in his left calf over the past 3 weeks. His vein has been present prior but has darkened in color recently. He states he has been doing leg exercises and yard work lately. No recent injury. His pain is relieved by icing area and exacerbated by rapid LLE movements. Denies any recent travel, calf swelling, chest pain, SOB, or other complaints at this time. He was messing around with the vein, pushing it back and forth and then noticed a small bump. It then seemed to go away and he has not messed with it since. He has notany asymmetric swelling of his calf and he has no significant pain currently.  Past Medical History:  Diagnosis Date  . Abnormal weight gain 05/25/2015  . Alcohol abuse 2016/2017   Pt states he was self medicating his anxiety.  Quit 04/2015.  Marland Kitchen Anal fissure   . Anxiety   . Diabetes mellitus ([place]) 12/2015   New dx 12/2015  . Hepatic steatosis 2015; 2016; 02/2016   +Alcohol has played a role.  CT abd showed fatty liver+ liver enzymes mildly elevated (chron viral hepatitis screening NEG).  Abd u/s 03/26/16 showed fatty liver, small GB polyps, o/w normal.  . History of kidney stones    [facility]  . HTN (hypertension)   . Hx of colonic polyp - ssp 02/11/2014  . Hyperlipidemia   . Insomnia   . Psoriasis      Patient Active Problem List   Diagnosis Date Noted  . Abnormal weight gain 05/25/2015  . Hyperlipidemia 05/25/2015  . Acute appendicitis 02/24/2015  . Perianal dermatitis 04/19/2014  . Hx of colonic polyp - ssp 02/11/2014  . Anal fissure - posterior 02/03/2014  . Abdominal pain, chronic, right lower quadrant 01/13/2014  . Essential hypertension 12/08/2009  . BACK PAIN 12/08/2009  . INSOMNIA 01/18/2008  . GAD (generalized anxiety disorder) 09/04/2007  . SORE THROAT 09/04/2007  . INTERMITTENT VERTIGO 03/09/2007  . ELEVATED BLOOD PRESSURE WITHOUT DIAGNOSIS OF HYPERTENSION 03/09/2007    Past Surgical History:  Procedure Laterality Date  . COLONOSCOPY W/ BIOPSIES  02/03/14   Cecal polyp (sessile serrated polyp w/out dysplasia) and posterior anal fissure; repeat TCS 2020 per Dr. Carlean Purl  . LAPAROSCOPIC APPENDECTOMY N/A 02/24/2015   Procedure: APPENDECTOMY LAPAROSCOPIC;  Surgeon: Alphonsa Overall, MD;  Location: [facility];  Service: General;  Laterality: N/A;  . right ankle     2014       Home Medications    Prior to Admission medications   Medication Sig Start Date End Date Taking? Authorizing Provider  Carbamazepine (EQUETRO) 100 MG CP12 12 hr capsule Take 100 mg by mouth daily. Rx'd by psych    [provider]  clonazePAM (KLONOPIN) 1 MG tablet Take 1 tablet (1 mg total) by mouth 2 (two) times daily as  needed for anxiety. 06/19/16   McGowen, Adrian Blackwater, MD  Eszopiclone 3 MG TABS Take 3 mg by mouth at bedtime. Take immediately before bedtime    [provider]  glipiZIDE (GLUCOTROL XL) 5 MG 24 hr tablet Take 1 tablet (5 mg total) by mouth daily with breakfast. 03/25/16   McGowen, Adrian Blackwater, MD  lisinopril (PRINIVIL,ZESTRIL) 10 MG tablet Take 1 tablet (10 mg total) by mouth daily. 06/25/16   McGowen, Adrian Blackwater, MD  meloxicam (MOBIC) 15 MG tablet Take 15 mg by mouth daily as needed for pain.    [provider]  promethazine (PHENERGAN) 12.5 MG tablet 1-2 tabs po  q6h prn nausea/vomiting 01/23/16   McGowen, Adrian Blackwater, MD  propranolol (INDERAL) 10 MG tablet Take 10 mg by mouth 3 (three) times daily. Rx'd by psych    [provider]    Family History Family History  Problem Relation Age of Onset  . Diabetes Mother   . Restless legs syndrome Mother   . Heart disease Mother   . Heart attack Mother 26  . Psoriasis Father   . Stroke Neg Hx   . Cancer Neg Hx     Social History Social History  Substance Use Topics  . Smoking status: Never Smoker  . Smokeless tobacco: Never Used  . Alcohol use 0.0 oz/week     Comment: once every 2 weeks, about 6-7 beers     Allergies   Patient has no known allergies.   Review of Systems Review of Systems  Respiratory: Negative for shortness of breath.   Cardiovascular: Negative for chest pain and leg swelling.  Musculoskeletal: Positive for myalgias (Left lower leg).  Skin: Positive for color change.  All other systems reviewed and are negative.    Physical Exam Updated Vital Signs BP 130/75 (BP Location: Right Arm)   Pulse 75   Temp 99 F (37.2 C) (Oral)   Resp 18   Ht 5' 9.5" (1.765 m)   Wt 260 lb (117.9 kg)   SpO2 100%   BMI 37.84 kg/m   Physical Exam  Constitutional: He is oriented to person, place, and time. He appears well-developed and well-nourished.  HENT:  Head: Normocephalic and atraumatic.  Right Ear: External ear normal.  Left Ear: External ear normal.  Nose: Nose normal.  Eyes: Right eye exhibits no discharge. Left eye exhibits no discharge.  Neck: Neck supple.  Cardiovascular: Normal rate, regular rhythm and normal heart sounds.   Pulses:      Dorsalis pedis pulses are 2+ on the right side, and 2+ on the left side.  Pulmonary/Chest: Effort normal and breath sounds normal.  Abdominal: Soft. There is no tenderness.  Musculoskeletal: He exhibits no edema.  No calf swelling bilaterally. No calf tenderness or skin changes bilaterally.   Neurological: He is alert and  oriented to person, place, and time.  Skin: Skin is warm and dry.  Nursing note and vitals reviewed.    ED Treatments / Results  DIAGNOSTIC STUDIES: Oxygen Saturation is 100% on RA, normal by my interpretation.    COORDINATION OF CARE: 6:44 PM- Pt advised of plan for treatment and pt agrees.  Labs (all labs ordered are listed, but only abnormal results are displayed) Labs Reviewed - No data to display  EKG  EKG Interpretation None       Radiology No results found.  Procedures Procedures (including critical care time)  Medications Ordered in ED Medications - No data to display   Initial Impression /  Assessment and Plan / ED Course  I have reviewed the triage vital signs and the nursing notes.  Pertinent labs & imaging results that were available during my care of the patient were reviewed by me and considered in my medical decision making (see chart for details).     Unclear why he had this transient, very small swelling at the point of his vein. Probably from missing around with it and causing some engorgement from blocking the vein. However he has no tenderness or symmetric swelling at this time. There is no vein tenderness or cordlike sensation to suggest an SVT. Given no calf swelling have very low suspicion for DVT. At this point, follow-up with PCP as needed but I doubt serious pathology at this point. Discussed return precautions. Discussed using NSAIDs as needed as well as compression stockings since he is about to go on a long trip.  Final Clinical Impressions(s) / ED Diagnoses   Final diagnoses:  Pain of left calf    New Prescriptions Discharge Medication List as of 07/29/2016  6:59 PM     I personally performed the services described in this documentation, which was scribed in my presence. The recorded information has been reviewed and is accurate.     [name], MD 07/29/16 [phone]

## 2016-07-29 NOTE — ED Triage Notes (Signed)
patient states that he has a vein protruding in his left calf

## 2016-08-07 DIAGNOSIS — F410 Panic disorder [episodic paroxysmal anxiety] without agoraphobia: Secondary | ICD-10-CM | POA: Diagnosis not present

## 2016-08-07 DIAGNOSIS — F411 Generalized anxiety disorder: Secondary | ICD-10-CM | POA: Diagnosis not present

## 2016-08-07 DIAGNOSIS — F10121 Alcohol abuse with intoxication delirium: Secondary | ICD-10-CM | POA: Diagnosis not present

## 2016-08-13 ENCOUNTER — Ambulatory Visit: Payer: BLUE CROSS/BLUE SHIELD | Admitting: Family Medicine

## 2016-09-03 ENCOUNTER — Ambulatory Visit (INDEPENDENT_AMBULATORY_CARE_PROVIDER_SITE_OTHER): Payer: BLUE CROSS/BLUE SHIELD | Admitting: Family Medicine

## 2016-09-03 ENCOUNTER — Encounter: Payer: Self-pay | Admitting: Family Medicine

## 2016-09-03 VITALS — BP 131/75 | HR 80 | Temp 98.2°F | Resp 16 | Ht 69.7 in | Wt 260.5 lb

## 2016-09-03 DIAGNOSIS — E119 Type 2 diabetes mellitus without complications: Secondary | ICD-10-CM

## 2016-09-03 DIAGNOSIS — E7800 Pure hypercholesterolemia, unspecified: Secondary | ICD-10-CM | POA: Diagnosis not present

## 2016-09-03 DIAGNOSIS — Z87898 Personal history of other specified conditions: Secondary | ICD-10-CM | POA: Diagnosis not present

## 2016-09-03 DIAGNOSIS — F1011 Alcohol abuse, in remission: Secondary | ICD-10-CM

## 2016-09-03 DIAGNOSIS — I10 Essential (primary) hypertension: Secondary | ICD-10-CM

## 2016-09-03 DIAGNOSIS — K760 Fatty (change of) liver, not elsewhere classified: Secondary | ICD-10-CM | POA: Diagnosis not present

## 2016-09-03 DIAGNOSIS — R740 Nonspecific elevation of levels of transaminase and lactic acid dehydrogenase [LDH]: Secondary | ICD-10-CM | POA: Diagnosis not present

## 2016-09-03 DIAGNOSIS — R7401 Elevation of levels of liver transaminase levels: Secondary | ICD-10-CM

## 2016-09-03 MED ORDER — PROMETHAZINE HCL 12.5 MG PO TABS: ORAL_TABLET | ORAL | 1 refills | Status: DC

## 2016-09-03 MED ORDER — ESZOPICLONE 3 MG PO TABS: ORAL_TABLET | ORAL | 5 refills | Status: DC

## 2016-09-03 NOTE — Progress Notes (Signed)
OFFICE VISIT  09/03/2016   CC:  Chief Complaint  Patient presents with  . Follow-up    RCI, pt is not fasting.    HPI:    Patient is a 41 y.o. Caucasian male who presents for f/u DM 2, HTN, and hyperlipidemia, hx of alc abuse---has hepatic steatosis. I last saw him about 5 mo ago.  DM: monitors gluc q 3 days---range 100-109.   No burning, tingling, or numbness in feet.  HTN: checks it daily--consistently <120/80.  He is doing some dieting and exercise. He has continued to abstain from drinking.  HLD: he is interested in recheck of cholesterol since he has done some TLC/wt loss but is not fasting today.  Pt has lots of anxiety with flying on airplanes.  Esp long flights.  He anticipates going on very long flights in about 9 mo.  Asks for med to help with this severe situational anxiety.  Went to ED about 4 weeks ago for a purple vein that concerned him---when he squeezed the area gently he noted the area "popped up".  He was told he did not have a DVT. Has been doing a lot of exercises with calves. ROS:     Past Medical History:  Diagnosis Date  . Abnormal weight gain 05/25/2015  . Alcohol abuse 2016/2017   Pt states he was self medicating his anxiety.  Quit 04/2015.  Marland Kitchen Anal fissure   . Anxiety   . Diabetes mellitus ([place]) 12/2015   New dx 12/2015  . Hepatic steatosis 2015; 2016; 02/2016   +Alcohol has played a role.  CT abd showed fatty liver+ liver enzymes mildly elevated (chron viral hepatitis screening NEG).  Abd u/s 03/26/16 showed fatty liver, small GB polyps, o/w normal.  . History of kidney stones    [facility]  . HTN (hypertension)   . Hx of colonic polyp - ssp 02/11/2014  . Hyperlipidemia   . Insomnia   . Psoriasis     Past Surgical History:  Procedure Laterality Date  . COLONOSCOPY W/ BIOPSIES  02/03/14   Cecal polyp (sessile serrated polyp w/out dysplasia) and posterior anal fissure; repeat TCS 2020 per Dr. Carlean Purl  . LAPAROSCOPIC APPENDECTOMY N/A  02/24/2015   Procedure: APPENDECTOMY LAPAROSCOPIC;  Surgeon: Alphonsa Overall, MD;  Location: [facility];  Service: General;  Laterality: N/A;  . right ankle     2014    Outpatient Medications Prior to Visit  Medication Sig Dispense Refill  . Carbamazepine (EQUETRO) 100 MG CP12 12 hr capsule Take 100 mg by mouth daily. Rx'd by psych    . clonazePAM (KLONOPIN) 1 MG tablet Take 1 tablet (1 mg total) by mouth 2 (two) times daily as needed for anxiety. 60 tablet 1  . Eszopiclone 3 MG TABS Take 3 mg by mouth at bedtime. Take immediately before bedtime    . glipiZIDE (GLUCOTROL XL) 5 MG 24 hr tablet Take 1 tablet (5 mg total) by mouth daily with breakfast. 30 tablet 6  . lisinopril (PRINIVIL,ZESTRIL) 10 MG tablet Take 1 tablet (10 mg total) by mouth daily. 30 tablet 3  . meloxicam (MOBIC) 15 MG tablet Take 15 mg by mouth daily as needed for pain.    . promethazine (PHENERGAN) 12.5 MG tablet 1-2 tabs po q6h prn nausea/vomiting 30 tablet 0  . propranolol (INDERAL) 10 MG tablet Take 10 mg by mouth 3 (three) times daily. Rx'd by psych     No facility-administered medications prior to visit.     No Known  Allergies  ROS As per HPI  PE: Blood pressure 131/75, pulse 80, temperature 98.2 F (36.8 C), temperature source Oral, resp. rate 16, height 5' 9.7" (1.77 m), weight 260 lb 8 oz (118.2 kg), SpO2 98 %. Gen: Alert, well appearing.  Patient is oriented to person, place, time, and situation. Foot exam - bilateral normal; no swelling, tenderness or skin or vascular lesions. Color and temperature is normal. Sensation is intact. Peripheral pulses are palpable. Toenails are normal.   LABS:  Lab Results  Component Value Date   TSH 1.03 06/08/2015   Lab Results  Component Value Date   WBC 7.4 01/23/2016   HGB 15.9 01/23/2016   HCT 46.2 01/23/2016   MCV 94.7 01/23/2016   PLT 145.0 (L) 01/23/2016   Lab Results  Component Value Date   CREATININE 0.98 01/23/2016   BUN 9 01/23/2016   NA 136 01/23/2016    K 3.8 01/23/2016   CL 101 01/23/2016   CO2 22 01/23/2016   Lab Results  Component Value Date   ALT 68 (H) 04/19/2016   AST 43 (H) 04/19/2016   ALKPHOS 71 04/19/2016   BILITOT 0.5 04/19/2016   Lab Results  Component Value Date   CHOL 213 (H) 06/08/2015   Lab Results  Component Value Date   HDL 38.50 (L) 06/08/2015   Lab Results  Component Value Date   LDLCALC 141 (H) 06/08/2015   Lab Results  Component Value Date   TRIG 166.0 (H) 06/08/2015   Lab Results  Component Value Date   CHOLHDL 6 06/08/2015   Lab Results  Component Value Date   HGBA1C 6.5 04/19/2016   IMPRESSION AND PLAN:  1) DM 2, historically well controlled. Recheck HbA1c. Feet exam normal. Referred pt to optometrist, Dr. Nicki Reaper, for diab retpthy screening.  2) HTN; The current medical regimen is effective;  continue present plan and medications. Lytes/cr with upcoming fasting labs.  3) Hyperlipidemia: not on meds due to patient wanting to try TLC and he is resistant to taking ANY meds in the first place.  Check FLP in near future when fasting--he'll make lab appointment.  4) Hepatic steatosis: he has lost some weight with TLC.  Recheck transaminases today.  5) Situational anxiety: plain flights.  Plans on taking 20 hour plane flight to [facility] with wife in May 2019.  I told him I would rx diazepam for him to take to try to help him get through this w/out excessive anxiety.  6) hx of alcohol abuse; he is doing well with abstaining from this.  An After Visit Summary was printed and given to the patient.  FOLLOW UP: Return in about 4 months (around 01/04/2017) for routine chronic illness f/u.  Signed:  Crissie Sickles, MD           09/03/2016

## 2016-09-11 ENCOUNTER — Other Ambulatory Visit: Payer: BLUE CROSS/BLUE SHIELD

## 2016-09-20 ENCOUNTER — Other Ambulatory Visit: Payer: Self-pay | Admitting: Family Medicine

## 2016-09-20 NOTE — Telephone Encounter (Signed)
[facility].  RF request for glipizide LOV: 09/03/16 Next ov: 01/08/17 Last written: 03/25/16 #30 w/ 6RF

## 2016-10-17 ENCOUNTER — Other Ambulatory Visit: Payer: Self-pay | Admitting: Family Medicine

## 2016-10-17 NOTE — Telephone Encounter (Signed)
[facility].

## 2016-11-05 DIAGNOSIS — F411 Generalized anxiety disorder: Secondary | ICD-10-CM | POA: Diagnosis not present

## 2016-11-05 DIAGNOSIS — F10121 Alcohol abuse with intoxication delirium: Secondary | ICD-10-CM | POA: Diagnosis not present

## 2016-11-05 DIAGNOSIS — F410 Panic disorder [episodic paroxysmal anxiety] without agoraphobia: Secondary | ICD-10-CM | POA: Diagnosis not present

## 2016-11-27 DIAGNOSIS — E119 Type 2 diabetes mellitus without complications: Secondary | ICD-10-CM | POA: Diagnosis not present

## 2016-11-27 LAB — HM DIABETES EYE EXAM

## 2016-11-28 ENCOUNTER — Encounter: Payer: Self-pay | Admitting: Family Medicine

## 2016-12-09 ENCOUNTER — Telehealth: Payer: Self-pay | Admitting: Family Medicine

## 2016-12-09 ENCOUNTER — Telehealth: Payer: BLUE CROSS/BLUE SHIELD | Admitting: Family

## 2016-12-09 ENCOUNTER — Other Ambulatory Visit (INDEPENDENT_AMBULATORY_CARE_PROVIDER_SITE_OTHER): Payer: BLUE CROSS/BLUE SHIELD

## 2016-12-09 DIAGNOSIS — A09 Infectious gastroenteritis and colitis, unspecified: Secondary | ICD-10-CM

## 2016-12-09 DIAGNOSIS — A049 Bacterial intestinal infection, unspecified: Secondary | ICD-10-CM

## 2016-12-09 DIAGNOSIS — Z719 Counseling, unspecified: Secondary | ICD-10-CM

## 2016-12-09 MED ORDER — ONDANSETRON 4 MG PO TBDP: 4.0000 mg | ORAL_TABLET | Freq: Three times a day (TID) | ORAL | 0 refills | Status: DC | PRN

## 2016-12-09 MED ORDER — CIPROFLOXACIN HCL 500 MG PO TABS
500.0000 mg | ORAL_TABLET | Freq: Two times a day (BID) | ORAL | 0 refills | Status: DC
Start: 2016-12-09 — End: 2016-12-09

## 2016-12-09 MED ORDER — AZITHROMYCIN 250 MG PO TABS: ORAL_TABLET | ORAL | 0 refills | Status: DC

## 2016-12-09 NOTE — Progress Notes (Signed)
Duplicate encounter

## 2016-12-09 NOTE — Telephone Encounter (Signed)
Patient's mom to pick up collection kit.  I wrote directions on the bag.

## 2016-12-09 NOTE — Addendum Note (Signed)
Addended by: Benjamine Mola on: 12/09/2016 02:06 PM   Modules accepted: Orders

## 2016-12-09 NOTE — Progress Notes (Signed)
We are sorry that you are not feeling well.  Here is how we plan to help!  Based on what you have shared with me it looks like you have Acute Infectious Diarrhea.  Most cases of acute diarrhea are due to infections with virus and bacteria and are self-limited conditions lasting less than 14 days.  For your symptoms you may take Imodium 2 mg tablets that are over the counter at your local pharmacy. Take two tablet now and then one after each loose stool up to 6 a day.  Antibiotics are not needed for most people with diarrhea.   Optional: I have sent in Cipro 500 mg two tablets twice a day for five days   HOME CARE  We recommend changing your diet to help with your symptoms for the next few days.  Drink plenty of fluids that contain water salt and sugar. Sports drinks such as Gatorade may help.   You may try broths, soups, bananas, applesauce, soft breads, mashed potatoes or crackers.   You are considered infectious for as long as the diarrhea continues. Hand washing or use of alcohol based hand sanitizers is recommend.  It is best to stay out of work or school until your symptoms stop.   GET HELP RIGHT AWAY  If you have dark yellow colored urine or do not pass urine frequently you should drink more fluids.    If your symptoms worsen   If you feel like you are going to pass out (faint)  You have a new problem  MAKE SURE YOU   Understand these instructions.  Will watch your condition.  Will get help right away if you are not doing well or get worse.  Your e-visit answers were reviewed by a board certified advanced clinical practitioner to complete your personal care plan.  Depending on the condition, your plan could have included both over the counter or prescription medications.  If there is a problem please reply  once you have received a response from your provider.  Your safety is important to [place].  If you have drug allergies check your prescription carefully.    You  can use MyChart to ask questions about today's visit, request a non-urgent call back, or ask for a work or school excuse for 24 hours related to this e-Visit. If it has been greater than 24 hours you will need to follow up with your provider, or enter a new e-Visit to address those concerns.   You will get an e-mail in the next two days asking about your experience.  I hope that your e-visit has been valuable and will speed your recovery. Thank you for using e-visits.

## 2016-12-09 NOTE — Telephone Encounter (Signed)
Patient called to request CDiff stool collection kit.  Per Mychart Evisit.  Patient requesting to just go ahead and get stool test so he can get the results back as soon as possible.  I told him I would get stool kit ready for pick up.  Okay to place order and what DX?  Please advise.

## 2016-12-09 NOTE — Telephone Encounter (Signed)
I went ahead and ordered the usual stool tests I do, so he'll need the whole stool collection kit.-thx

## 2016-12-09 NOTE — Progress Notes (Signed)
 *  Lengthy telephone discussion. Pt will contact his Dr. Ernestine Conrad at [facility] to ask about CDIFF lab test if not better within 24h. Pt to drink gatorade, electrolytes and liquid diet. Pt checked CBG while on phone and 134 but unable to eat so we will be more conservative with Azithromycin rather than Cipro (not not on insulin)  Swapped out Azithromycin 277m bid x 3 days for Cipro as pt is not eating much. No hx low blood sugar. Pt already has phenergan but advised him not to take this with his clonazepam; Will give Zofran 474mODT q8h prn nausea/vomiting.

## 2016-12-10 LAB — FECAL LACTOFERRIN, QUANT
FECAL LACTOFERRIN: POSITIVE — AB
MICRO NUMBER:: 81149881
SPECIMEN QUALITY:: ADEQUATE

## 2016-12-10 LAB — CLOSTRIDIUM DIFFICILE BY PCR: CDIFFPCR: NOT DETECTED

## 2016-12-13 ENCOUNTER — Encounter: Payer: Self-pay | Admitting: *Deleted

## 2016-12-13 LAB — STOOL CULTURE
MICRO NUMBER: 81149904
MICRO NUMBER:: 81149905
MICRO NUMBER:: 81149906
SHIGA RESULT:: NOT DETECTED
SPECIMEN QUALITY: ADEQUATE
SPECIMEN QUALITY: ADEQUATE
SPECIMEN QUALITY:: ADEQUATE

## 2016-12-19 LAB — GIARDIA/CRYPTOSPORIDIUM (EIA)
MICRO NUMBER: 81149899
MICRO NUMBER:: 81149900
RESULT: NOT DETECTED
RESULT:: NOT DETECTED
SPECIMEN QUALITY: ADEQUATE
SPECIMEN QUALITY:: ADEQUATE

## 2017-01-08 ENCOUNTER — Ambulatory Visit: Payer: BLUE CROSS/BLUE SHIELD | Admitting: Family Medicine

## 2017-02-07 DIAGNOSIS — F411 Generalized anxiety disorder: Secondary | ICD-10-CM | POA: Diagnosis not present

## 2017-02-27 ENCOUNTER — Other Ambulatory Visit: Payer: Self-pay | Admitting: Family Medicine

## 2017-02-27 NOTE — Telephone Encounter (Signed)
I will give pt #15 clonazepam, no RF. He is overdue for f/u for his diabetes, high blood pressure, and anxiety. Needs to make f/u appt for these issues (preferably fasting) before he runs out of his clonazepam.-thx

## 2017-02-27 NOTE — Telephone Encounter (Signed)
[facility] [place] [facility].  RF request for clonazepam LOV: 09/03/16 Next ov: None Last written: 06/19/16 #60 w/ 1RF  Please advise. Thanks.

## 2017-02-28 NOTE — Telephone Encounter (Addendum)
Okay for [facility] to advise pt that Dr. Anitra Lauth will send in #15 tabs of clonazepam, pt will need to schedule f/u apt with Dr. Anitra Lauth for DM, HTN and anxiety.  Left message for pt to call back.  Rx faxed.

## 2017-03-03 NOTE — Telephone Encounter (Signed)
Apt made for 02/28/17 at 2:00pm.

## 2017-03-05 ENCOUNTER — Other Ambulatory Visit: Payer: Self-pay

## 2017-03-05 MED ORDER — ESZOPICLONE 3 MG PO TABS: ORAL_TABLET | ORAL | 5 refills | Status: DC

## 2017-03-20 ENCOUNTER — Ambulatory Visit: Payer: BLUE CROSS/BLUE SHIELD | Admitting: Family Medicine

## 2017-03-25 ENCOUNTER — Other Ambulatory Visit: Payer: Self-pay | Admitting: Family Medicine

## 2017-04-16 ENCOUNTER — Encounter: Payer: Self-pay | Admitting: Family Medicine

## 2017-04-16 ENCOUNTER — Ambulatory Visit: Payer: BLUE CROSS/BLUE SHIELD | Admitting: Family Medicine

## 2017-04-16 ENCOUNTER — Encounter: Payer: Self-pay | Admitting: *Deleted

## 2017-04-16 VITALS — BP 119/64 | HR 77 | Temp 98.5°F | Resp 16 | Ht 69.7 in | Wt 269.5 lb

## 2017-04-16 DIAGNOSIS — I10 Essential (primary) hypertension: Secondary | ICD-10-CM

## 2017-04-16 DIAGNOSIS — E119 Type 2 diabetes mellitus without complications: Secondary | ICD-10-CM

## 2017-04-16 DIAGNOSIS — K7581 Nonalcoholic steatohepatitis (NASH): Secondary | ICD-10-CM

## 2017-04-16 DIAGNOSIS — E7800 Pure hypercholesterolemia, unspecified: Secondary | ICD-10-CM | POA: Diagnosis not present

## 2017-04-16 DIAGNOSIS — F411 Generalized anxiety disorder: Secondary | ICD-10-CM

## 2017-04-16 DIAGNOSIS — Z23 Encounter for immunization: Secondary | ICD-10-CM

## 2017-04-16 DIAGNOSIS — Z79899 Other long term (current) drug therapy: Secondary | ICD-10-CM | POA: Diagnosis not present

## 2017-04-16 LAB — COMPREHENSIVE METABOLIC PANEL
ALBUMIN: 4.6 g/dL (ref 3.5–5.2)
ALK PHOS: 63 U/L (ref 39–117)
ALT: 46 U/L (ref 0–53)
AST: 35 U/L (ref 0–37)
BUN: 15 mg/dL (ref 6–23)
CHLORIDE: 102 meq/L (ref 96–112)
CO2: 26 mEq/L (ref 19–32)
CREATININE: 0.97 mg/dL (ref 0.40–1.50)
Calcium: 9.9 mg/dL (ref 8.4–10.5)
GFR: 90.79 mL/min (ref >60.00)
GLUCOSE: 101 mg/dL — AB (ref 70–99)
Potassium: 4 mEq/L (ref 3.5–5.1)
SODIUM: 137 meq/L (ref 135–145)
TOTAL PROTEIN: 7.3 g/dL (ref 6.0–8.3)
Total Bilirubin: 0.5 mg/dL (ref 0.2–1.2)

## 2017-04-16 LAB — LIPID PANEL
CHOL/HDL RATIO: 7
Cholesterol: 230 mg/dL — ABNORMAL HIGH (ref 0–200)
HDL: 33.9 mg/dL — ABNORMAL LOW (ref >39.00)
NonHDL: 196.3
Triglycerides: 343 mg/dL — ABNORMAL HIGH (ref 0.0–149.0)
VLDL: 68.6 mg/dL — ABNORMAL HIGH (ref 0.0–40.0)

## 2017-04-16 LAB — HEMOGLOBIN A1C: Hgb A1c MFr Bld: 5.6 % (ref 4.6–6.5)

## 2017-04-16 LAB — LDL CHOLESTEROL, DIRECT: LDL DIRECT: 134 mg/dL

## 2017-04-16 MED ORDER — CLONAZEPAM 1 MG PO TABS: 1.0000 mg | ORAL_TABLET | Freq: Two times a day (BID) | ORAL | 5 refills | Status: DC | PRN

## 2017-04-16 NOTE — Progress Notes (Signed)
OFFICE VISIT  04/16/2017   CC:  Chief Complaint  Patient presents with  . Follow-up    HTN, Anxiety and DM   HPI:    Patient is a 42 y.o. Caucasian male who presents for f/u DM 2, HTN, anxiety disorder, NASH.  HTN: home monitoring 120s/70s consistently.  Anxiety: takes clonaz 1 mg bid.  This is helpful for his chronic anxiety. Took clonaz most recently this morning.  He has not been taking other drugs or drinking alcohol.  His psychiatrist has him on carbamazepine and trintellix.    DM/NASH/Obesity: says he has been exercising more.  Diet has been "pretty good".  Gradually making changes. No home glucose monitoring.  ROS: no CP, no SOB, no HAs, no palpitations, no dizziness.   Past Medical History:  Diagnosis Date  . Abnormal weight gain 05/25/2015  . Alcohol abuse 2016/2017   Pt states he was self medicating his anxiety.  Quit 04/2015.  Marland Kitchen Anal fissure   . Anxiety   . Diabetes mellitus ([place]) 12/2015   New dx 12/2015  . Hepatic steatosis 2015; 2016; 02/2016   +Alcohol has played a role.  CT abd showed fatty liver+ liver enzymes mildly elevated (chron viral hepatitis screening NEG).  Abd u/s 03/26/16 showed fatty liver, small GB polyps, o/w normal.  . History of kidney stones    [facility]  . HTN (hypertension)   . Hx of colonic polyp - ssp 02/11/2014  . Hyperlipidemia   . Insomnia   . Psoriasis     Past Surgical History:  Procedure Laterality Date  . COLONOSCOPY W/ BIOPSIES  02/03/14   Cecal polyp (sessile serrated polyp w/out dysplasia) and posterior anal fissure; repeat TCS 2020 per Dr. Carlean Purl  . LAPAROSCOPIC APPENDECTOMY N/A 02/24/2015   Procedure: APPENDECTOMY LAPAROSCOPIC;  Surgeon: Alphonsa Overall, MD;  Location: [facility];  Service: General;  Laterality: N/A;  . right ankle     2014    Outpatient Medications Prior to Visit  Medication Sig Dispense Refill  . Carbamazepine (EQUETRO) 100 MG CP12 12 hr capsule Take 100 mg by mouth daily. Rx'd by psych    .  clonazePAM (KLONOPIN) 1 MG tablet TAKE 1 TABLET BY MOUTH TWICE A DAY AS NEEDED 15 tablet 0  . Eszopiclone 3 MG TABS 1 tab po qhs prn insomnia 30 tablet 5  . glipiZIDE (GLUCOTROL XL) 5 MG 24 hr tablet TAKE 1 TABLET (5 MG TOTAL) BY MOUTH DAILY WITH BREAKFAST. 30 tablet 0  . lisinopril (PRINIVIL,ZESTRIL) 10 MG tablet TAKE 1 TABLET BY MOUTH EVERY DAY 30 tablet 5  . meloxicam (MOBIC) 15 MG tablet Take 15 mg by mouth daily as needed for pain.    . promethazine (PHENERGAN) 12.5 MG tablet 1-2 tabs po q6h prn nausea/vomiting 30 tablet 1  . propranolol (INDERAL) 10 MG tablet Take 10 mg by mouth 3 (three) times daily. Rx'd by psych    . vortioxetine HBr (TRINTELLIX) 10 MG TABS tablet Take 10 mg by mouth daily.    Marland Kitchen azithromycin (ZITHROMAX) 250 MG tablet 249m twice daily for 3 days (Patient not taking: Reported on 04/16/2017) 6 tablet 0  . ondansetron (ZOFRAN ODT) 4 MG disintegrating tablet Take 1 tablet (4 mg total) by mouth every 8 (eight) hours as needed for nausea or vomiting. (Patient not taking: Reported on 04/16/2017) 20 tablet 0   No facility-administered medications prior to visit.     No Known Allergies  ROS As per HPI  PE: Blood pressure 119/64, pulse 77,  temperature 98.5 F (36.9 C), temperature source Oral, resp. rate 16, height 5' 9.7" (1.77 m), weight 269 lb 8 oz (122.2 kg), SpO2 95 %. Body mass index is 39 kg/m.  Gen: Alert, well appearing.  Patient is oriented to person, place, time, and situation. AFFECT: pleasant, lucid thought and speech. CV: RRR, no m/r/g.   LUNGS: CTA bilat, nonlabored resps, good aeration in all lung fields. EXT: no clubbing, cyanosis, or edema.    LABS:  Lab Results  Component Value Date   TSH 1.03 06/08/2015   Lab Results  Component Value Date   WBC 7.4 01/23/2016   HGB 15.9 01/23/2016   HCT 46.2 01/23/2016   MCV 94.7 01/23/2016   PLT 145.0 (L) 01/23/2016   Lab Results  Component Value Date   CREATININE 0.98 01/23/2016   BUN 9 01/23/2016    NA 136 01/23/2016   K 3.8 01/23/2016   CL 101 01/23/2016   CO2 22 01/23/2016   Lab Results  Component Value Date   ALT 68 (H) 04/19/2016   AST 43 (H) 04/19/2016   ALKPHOS 71 04/19/2016   BILITOT 0.5 04/19/2016   Lab Results  Component Value Date   CHOL 213 (H) 06/08/2015   Lab Results  Component Value Date   HDL 38.50 (L) 06/08/2015   Lab Results  Component Value Date   LDLCALC 141 (H) 06/08/2015   Lab Results  Component Value Date   TRIG 166.0 (H) 06/08/2015   Lab Results  Component Value Date   CHOLHDL 6 06/08/2015   Lab Results  Component Value Date   HGBA1C 6.5 04/19/2016    IMPRESSION AND PLAN:  1) DM 2: HbA1c today. Will start statin as long as his AST/ALT are stable today. Discussed this with patient and he was agreeable to this plan.  2) HTN: The current medical regimen is effective;  continue present plan and medications. Lytes/cr today.  3) Anxiety disorder: stable on bid clonazepam. Controlled substance contract reviewed, signed and in chart today. UDS today (expect clonaz only). Clonaz rx done today.  4) NASH + obesity: recheck hepatic panel.  Discussed importance of wt loss, continue gradual healthy dietary changes, needs to increase exercise significantly.  Flu vaccine given today.Marland Kitchen  An After Visit Summary was printed and given to the patient.  FOLLOW UP: 3 mo f/u CPE  Signed:  Crissie Sickles, MD           04/16/2017

## 2017-04-22 ENCOUNTER — Other Ambulatory Visit: Payer: Self-pay | Admitting: Family Medicine

## 2017-04-23 ENCOUNTER — Other Ambulatory Visit: Payer: Self-pay | Admitting: Family Medicine

## 2017-05-05 ENCOUNTER — Encounter: Payer: Self-pay | Admitting: Family Medicine

## 2017-05-05 ENCOUNTER — Ambulatory Visit: Payer: BLUE CROSS/BLUE SHIELD | Admitting: Family Medicine

## 2017-05-05 VITALS — BP 121/81 | HR 72 | Temp 98.1°F | Ht 69.7 in | Wt 275.8 lb

## 2017-05-05 DIAGNOSIS — R05 Cough: Secondary | ICD-10-CM | POA: Diagnosis not present

## 2017-05-05 DIAGNOSIS — J40 Bronchitis, not specified as acute or chronic: Secondary | ICD-10-CM

## 2017-05-05 DIAGNOSIS — R059 Cough, unspecified: Secondary | ICD-10-CM

## 2017-05-05 MED ORDER — AZITHROMYCIN 250 MG PO TABS: ORAL_TABLET | ORAL | 0 refills | Status: DC

## 2017-05-05 NOTE — Progress Notes (Signed)
Tony [name] , 1975/05/18, 42 y.o., male MRN: [id number] Patient Care Team    Relationship Specialty Notifications Start End  McGowen, Adrian Blackwater, MD PCP - General Family Medicine  12/06/14   Gatha Mayer, MD Consulting Physician Gastroenterology  05/25/15   Alphonsa Overall, MD Consulting Physician General Surgery  05/25/15     Chief Complaint  Patient presents with  . Flu-like symptom    pt c/o of cough, shortness of breath, sweating X 9 days. Pt claim he had E-Visit monday the 4th was prescribe Oseltamivir Phosphate 75MG for 5 days, he said it work a little but still not better.     Subjective: Pt presents for an OV with complaints of flu-like symptoms of 9 days  duration.  Associated symptoms include cough, shortness of breath. Pt has na E-Visit and treated w/ tamiflu BID x5 days. Reports he did have some  Improvement, however symptoms are still present. He feels much better today, but cough and easily fatigued, sweating with walking.  No h/o asthma.   Depression screen [facility] 2/9 04/23/2016 04/09/2016  Decreased Interest 2 2  Down, Depressed, Hopeless 2 2  PHQ - 2 Score 4 4    No Known Allergies Social History   Tobacco Use  . Smoking status: Never Smoker  . Smokeless tobacco: Never Used  Substance Use Topics  . Alcohol use: Yes    Alcohol/week: 0.0 oz    Comment: once every 2 weeks, about 6-7 beers   Past Medical History:  Diagnosis Date  . Abnormal weight gain 05/25/2015  . Alcohol abuse 2016/2017   Pt states he was self medicating his anxiety.  Quit 04/2015.  Marland Kitchen Anal fissure   . Anxiety   . Diabetes mellitus ([place]) 12/2015   New dx 12/2015  . Hepatic steatosis 2015; 2016; 02/2016   +Alcohol has played a role.  CT abd showed fatty liver+ liver enzymes mildly elevated (chron viral hepatitis screening NEG).  Abd u/s 03/26/16 showed fatty liver, small GB polyps, o/w normal.  . History of kidney stones    [facility]  . HTN (hypertension)   . Hx of colonic polyp - ssp  02/11/2014  . Hyperlipidemia   . Insomnia   . Psoriasis    Past Surgical History:  Procedure Laterality Date  . COLONOSCOPY W/ BIOPSIES  02/03/14   Cecal polyp (sessile serrated polyp w/out dysplasia) and posterior anal fissure; repeat TCS 2020 per Dr. Carlean Purl  . LAPAROSCOPIC APPENDECTOMY N/A 02/24/2015   Procedure: APPENDECTOMY LAPAROSCOPIC;  Surgeon: Alphonsa Overall, MD;  Location: [facility];  Service: General;  Laterality: N/A;  . right ankle     2014   Family History  Problem Relation Age of Onset  . Diabetes Mother   . Restless legs syndrome Mother   . Heart disease Mother   . Heart attack Mother 13  . Psoriasis Father   . Stroke Neg Hx   . Cancer Neg Hx    Allergies as of 05/05/2017   No Known Allergies     Medication List        Accurate as of 05/05/17  3:12 PM. Always use your most recent med list.          clonazePAM 1 MG tablet Commonly known as:  KLONOPIN Take 1 tablet (1 mg total) by mouth 2 (two) times daily as needed.   EQUETRO 100 MG Cp12 12 hr capsule Generic drug:  Carbamazepine Take 100 mg by mouth daily. Rx'd by psych  Eszopiclone 3 MG Tabs 1 tab po qhs prn insomnia   glipiZIDE 5 MG 24 hr tablet Commonly known as:  GLUCOTROL XL TAKE 1 TABLET (5 MG TOTAL) BY MOUTH DAILY WITH BREAKFAST.   lisinopril 10 MG tablet Commonly known as:  PRINIVIL,ZESTRIL TAKE 1 TABLET BY MOUTH EVERY DAY   meloxicam 15 MG tablet Commonly known as:  MOBIC Take 15 mg by mouth daily as needed for pain.   promethazine 12.5 MG tablet Commonly known as:  PHENERGAN 1-2 tabs po q6h prn nausea/vomiting   propranolol 10 MG tablet Commonly known as:  INDERAL Take 10 mg by mouth 3 (three) times daily. Rx'd by psych   TRINTELLIX 10 MG Tabs tablet Generic drug:  vortioxetine HBr Take 10 mg by mouth daily.       All past medical history, surgical history, allergies, family history, immunizations andmedications were updated in the EMR today and reviewed under the history  and medication portions of their EMR.     ROS: Negative, with the exception of above mentioned in HPI   Objective:  BP 121/81 (BP Location: Right Arm, Patient Position: Sitting, Cuff Size: Large)   Pulse 72   Temp 98.1 F (36.7 C) (Oral)   Ht 5' 9.7" (1.77 m)   Wt 275 lb 12.8 oz (125.1 kg)   SpO2 97%   BMI 39.91 kg/m  Body mass index is 39.91 kg/m. Gen: Afebrile. No acute distress. Nontoxic in appearance, well developed, well nourished.  HENT: AT. [place]. Bilateral TM visualized without erythema or bulging. MMM, no oral lesions. Bilateral nares mild erythema, drainage present.. Throat without erythema or exudates. Cough present. Hoarseness present. No tenderness palpation maxillary sinus. Eyes:Pupils Equal Round Reactive to light, Extraocular movements intact,  Conjunctiva without redness, discharge or icterus. Neck/lymp/endocrine: Supple, bilateral anterior cervical lymphadenopathy CV: RRR  Chest: CTAB, no wheeze or crackles. Diminished breath sounds bilaterally. Abd: Soft. Obese. NTND. BS present. No Masses palpated. Neuro:  Normal gait. PERLA. EOMi. Alert. Oriented x3   No exam data present No results found. No results found for this or any previous visit (from the past 24 hour(s)).  Assessment/Plan: [name] is a 42 y.o. male present for OV for  Cough Bronchitis Start z pack today.  Rest, hydrate.  + flonase, mucinex DM if cough, nettie pot or nasal saline.  If cough present it can last up to 6-8 weeks.  F/U 2 weeks of not improved. Sooner if worsening.  If not improving in 2 days, get chest xray at [facility] (patient elected not to have x-ray completed today) - DG Chest 2 View; Future   Reviewed expectations re: course of current medical issues.  Discussed self-management of symptoms.  Outlined signs and symptoms indicating need for more acute intervention.  Patient verbalized understanding and all questions were answered.  Patient received an After-Visit  Summary.    No orders of the defined types were placed in this encounter.    Note is dictated utilizing voice recognition software. Although note has been proof read prior to signing, occasional typographical errors still can be missed. If any questions arise, please do not hesitate to call for verification.   electronically signed by:  Howard Pouch, DO  [place]

## 2017-05-05 NOTE — Patient Instructions (Signed)
Start z pack today.  Rest, hydrate.  + flonase, mucinex DM if cough, nettie pot or nasal saline.  If cough present it can last up to 6-8 weeks.  F/U 2 weeks of not improved. Sooner if worsening.  If not improving in 2 days, get chest xray at [facility], the order is in for you.      Community-Acquired Pneumonia, Adult Pneumonia is an infection of the lungs. One type of pneumonia can happen while a person is in a hospital. A different type can happen when a person is not in a hospital (community-acquired pneumonia). It is easy for this kind to spread from person to person. It can spread to you if you breathe near an infected person who coughs or sneezes. Some symptoms include:  A dry cough.  A wet (productive) cough.  Fever.  Sweating.  Chest pain.  Follow these instructions at home:  Take over-the-counter and prescription medicines only as told by your doctor. ? Only take cough medicine if you are losing sleep. ? If you were prescribed an antibiotic medicine, take it as told by your doctor. Do not stop taking the antibiotic even if you start to feel better.  Sleep with your head and neck raised (elevated). You can do this by putting a few pillows under your head, or you can sleep in a recliner.  Do not use tobacco products. These include cigarettes, chewing tobacco, and e-cigarettes. If you need help quitting, ask your doctor.  Drink enough water to keep your pee (urine) clear or pale yellow. A shot (vaccine) can help prevent pneumonia. Shots are often suggested for:  People older than 41 years of age.  People older than 41 years of age: ? Who are having cancer treatment. ? Who have long-term (chronic) lung disease. ? Who have problems with their body's defense system (immune system).  You may also prevent pneumonia if you take these actions:  Get the flu (influenza) shot every year.  Go to the dentist as often as told.  Wash your hands often. If soap and water are  not available, use hand sanitizer.  Contact a doctor if:  You have a fever.  You lose sleep because your cough medicine does not help. Get help right away if:  You are short of breath and it gets worse.  You have more chest pain.  Your sickness gets worse. This is very serious if: ? You are an older adult. ? Your body's defense system is weak.  You cough up blood. This information is not intended to replace advice given to you by your health care provider. Make sure you discuss any questions you have with your health care provider. Document Released: 07/31/2007 Document Revised: 07/20/2015 Document Reviewed: 06/08/2014 Elsevier Interactive Patient Education  [employer].

## 2017-05-06 ENCOUNTER — Encounter: Payer: Self-pay | Admitting: Family Medicine

## 2017-05-06 ENCOUNTER — Ambulatory Visit (HOSPITAL_BASED_OUTPATIENT_CLINIC_OR_DEPARTMENT_OTHER)
Admission: RE | Admit: 2017-05-06 | Discharge: 2017-05-06 | Disposition: A | Discharge status: Home / Self Care | Payer: BLUE CROSS/BLUE SHIELD | Source: Ambulatory Visit | Attending: Family Medicine | Admitting: Family Medicine

## 2017-05-06 DIAGNOSIS — R05 Cough: Secondary | ICD-10-CM | POA: Insufficient documentation

## 2017-05-06 DIAGNOSIS — J40 Bronchitis, not specified as acute or chronic: Secondary | ICD-10-CM

## 2017-05-06 DIAGNOSIS — R059 Cough, unspecified: Secondary | ICD-10-CM

## 2017-05-07 ENCOUNTER — Telehealth: Payer: Self-pay | Admitting: Family Medicine

## 2017-05-07 NOTE — Telephone Encounter (Signed)
Copied from [place] [phone]. Topic: Inquiry >> May 07, 2017  4:11 PM [name] wrote: Reason for CRM: pt called stating he saw results of chest xray on mychart stating he has bronchitis. Pt asking if he needs to continue same meds? Please advise.

## 2017-05-07 NOTE — Telephone Encounter (Signed)
Patient notified, see note.

## 2017-05-08 DIAGNOSIS — F410 Panic disorder [episodic paroxysmal anxiety] without agoraphobia: Secondary | ICD-10-CM | POA: Diagnosis not present

## 2017-05-08 DIAGNOSIS — F411 Generalized anxiety disorder: Secondary | ICD-10-CM | POA: Diagnosis not present

## 2017-05-08 DIAGNOSIS — F10120 Alcohol abuse with intoxication, uncomplicated: Secondary | ICD-10-CM | POA: Diagnosis not present

## 2017-05-15 ENCOUNTER — Encounter: Payer: Self-pay | Admitting: Family Medicine

## 2017-06-17 ENCOUNTER — Encounter: Payer: Self-pay | Admitting: Family Medicine

## 2017-06-18 ENCOUNTER — Ambulatory Visit (INDEPENDENT_AMBULATORY_CARE_PROVIDER_SITE_OTHER): Payer: BLUE CROSS/BLUE SHIELD | Admitting: Family Medicine

## 2017-06-18 ENCOUNTER — Encounter: Payer: Self-pay | Admitting: Family Medicine

## 2017-06-18 VITALS — BP 125/77 | HR 77 | Temp 97.9°F | Resp 16 | Ht 70.0 in | Wt 275.4 lb

## 2017-06-18 DIAGNOSIS — E7800 Pure hypercholesterolemia, unspecified: Secondary | ICD-10-CM

## 2017-06-18 DIAGNOSIS — I10 Essential (primary) hypertension: Secondary | ICD-10-CM | POA: Diagnosis not present

## 2017-06-18 DIAGNOSIS — Z79899 Other long term (current) drug therapy: Secondary | ICD-10-CM | POA: Diagnosis not present

## 2017-06-18 DIAGNOSIS — E119 Type 2 diabetes mellitus without complications: Secondary | ICD-10-CM | POA: Diagnosis not present

## 2017-06-18 DIAGNOSIS — F411 Generalized anxiety disorder: Secondary | ICD-10-CM | POA: Diagnosis not present

## 2017-06-18 DIAGNOSIS — Z0000 Encounter for general adult medical examination without abnormal findings: Secondary | ICD-10-CM | POA: Diagnosis not present

## 2017-06-18 LAB — COMPREHENSIVE METABOLIC PANEL
ALT: 68 U/L — ABNORMAL HIGH (ref 0–53)
AST: 54 U/L — ABNORMAL HIGH (ref 0–37)
Albumin: 4.5 g/dL (ref 3.5–5.2)
Alkaline Phosphatase: 82 U/L (ref 39–117)
BILIRUBIN TOTAL: 0.4 mg/dL (ref 0.2–1.2)
BUN: 15 mg/dL (ref 6–23)
CALCIUM: 9.3 mg/dL (ref 8.4–10.5)
CO2: 24 mEq/L (ref 19–32)
Chloride: 103 mEq/L (ref 96–112)
Creatinine, Ser: 0.88 mg/dL (ref 0.40–1.50)
GFR: 101.5 mL/min (ref >60.00)
GLUCOSE: 151 mg/dL — AB (ref 70–99)
POTASSIUM: 4.1 meq/L (ref 3.5–5.1)
Sodium: 135 mEq/L (ref 135–145)
TOTAL PROTEIN: 7.3 g/dL (ref 6.0–8.3)

## 2017-06-18 LAB — CBC WITH DIFFERENTIAL/PLATELET
BASOS PCT: 1.1 % (ref 0.0–3.0)
Basophils Absolute: 0.1 10*3/uL (ref 0.0–0.1)
EOS PCT: 5.7 % — AB (ref 0.0–5.0)
Eosinophils Absolute: 0.3 10*3/uL (ref 0.0–0.7)
HCT: 44.3 % (ref 39.0–52.0)
HEMOGLOBIN: 15.2 g/dL (ref 13.0–17.0)
LYMPHS ABS: 1.7 10*3/uL (ref 0.7–4.0)
Lymphocytes Relative: 31.7 % (ref 12.0–46.0)
MCHC: 34.3 g/dL (ref 30.0–36.0)
MCV: 98.2 fl (ref 78.0–100.0)
MONO ABS: 0.4 10*3/uL (ref 0.1–1.0)
MONOS PCT: 8 % (ref 3.0–12.0)
Neutro Abs: 2.9 10*3/uL (ref 1.4–7.7)
Neutrophils Relative %: 53.5 % (ref 43.0–77.0)
Platelets: 166 10*3/uL (ref 150.0–400.0)
RBC: 4.51 Mil/uL (ref 4.22–5.81)
RDW: 13.5 % (ref 11.5–15.5)
WBC: 5.4 10*3/uL (ref 4.0–10.5)

## 2017-06-18 LAB — TSH: TSH: 1.47 u[IU]/mL (ref 0.35–4.50)

## 2017-06-18 MED ORDER — ATORVASTATIN CALCIUM 20 MG PO TABS: 20.0000 mg | ORAL_TABLET | Freq: Every day | ORAL | 0 refills | Status: DC

## 2017-06-18 NOTE — Patient Instructions (Signed)

## 2017-06-18 NOTE — Progress Notes (Signed)
Office Note 06/18/2017  CC:  Chief Complaint  Patient presents with  . Annual Exam    Pt is not fasting.     HPI:  Tony [name] is a 42 y.o. White male who is here for annual health maintenance exam. Last o/v his cholesterol was elevated and I recommended a statin.  Not clear if he ever got this message. He is agreeable, although reluctantly, to starting statin.  His psychiatrist recently d/cd his trintellix due to side effects and she got him on carbamazepine.  Had a bachelor party and drank some alcohol recently, otherwise he has been good with this. Very anxious about plane flight x 16hrs long coming up for his honeymoon, asks if he can take 15m of clonaz twice during this time and I said yes.  Past Medical History:  Diagnosis Date  . Abnormal weight gain 05/25/2015  . Alcohol abuse 2016/2017   Pt states he was self medicating his anxiety.  Quit 04/2015.  .Marland KitchenAnal fissure   . Anxiety   . Diabetes mellitus ([place] 12/2015   New dx 12/2015  . Hepatic steatosis 2015; 2016; 02/2016   +Alcohol has played a role.  CT abd showed fatty liver+ liver enzymes mildly elevated (chron viral hepatitis screening NEG).  Abd u/s 03/26/16 showed fatty liver, small GB polyps, o/w normal.  . History of kidney stones    [facility]  . HTN (hypertension)   . Hx of colonic polyp - ssp 02/11/2014  . Hyperlipidemia    Plan was to recommend statin 04/2017 but unable to make phone contact with pt.  . Insomnia   . Obesity, Class II, BMI 35-39.9   . Psoriasis     Past Surgical History:  Procedure Laterality Date  . COLONOSCOPY W/ BIOPSIES  02/03/14   Cecal polyp (sessile serrated polyp w/out dysplasia) and posterior anal fissure; repeat TCS 2020 per Dr. GCarlean Purl . LAPAROSCOPIC APPENDECTOMY N/A 02/24/2015   Procedure: APPENDECTOMY LAPAROSCOPIC;  Surgeon: DAlphonsa Overall MD;  Location: [facility];  Service: General;  Laterality: N/A;  . right ankle     2014    Family History  Problem Relation Age of  Onset  . Diabetes Mother   . Restless legs syndrome Mother   . Heart disease Mother   . Heart attack Mother 564 . Psoriasis Father   . Stroke Neg Hx   . Cancer Neg Hx     Social History   Socioeconomic History  . Marital status: Single    Spouse name: Not on file  . Number of children: Not on file  . Years of education: Not on file  . Highest education level: Not on file  Occupational History  . Occupation: pSecondary school teacher Social Needs  . Financial resource strain: Not on file  . Food insecurity:    Worry: Not on file    Inability: Not on file  . Transportation needs:    Medical: Not on file    Non-medical: Not on file  Tobacco Use  . Smoking status: Never Smoker  . Smokeless tobacco: Never Used  Substance and Sexual Activity  . Alcohol use: Yes    Alcohol/week: 0.0 oz    Comment: once every 2 weeks, about 6-7 beers  . Drug use: No  . Sexual activity: Not on file  Lifestyle  . Physical activity:    Days per week: Not on file    Minutes per session: Not on file  . Stress: Not on file  Relationships  . Social connections:    Talks on phone: Not on file    Gets together: Not on file    Attends religious service: Not on file    Active member of club or organization: Not on file    Attends meetings of clubs or organizations: Not on file    Relationship status: Not on file  . Intimate partner violence:    Fear of current or ex partner: Not on file    Emotionally abused: Not on file    Physically abused: Not on file    Forced sexual activity: Not on file  Other Topics Concern  . Not on file  Social History Narrative   Single, no children.   Orig from [place].   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   No tobacco.   Alcohol: hx of "heavy drinking".  Cut back as of 04/2015.    Outpatient Medications Prior to Visit  Medication Sig Dispense Refill  . Carbamazepine (EQUETRO) 100 MG CP12 12 hr capsule Take 300 mg by mouth daily. Rx'd by psych     . clonazePAM (KLONOPIN) 1 MG  tablet Take 1 tablet (1 mg total) by mouth 2 (two) times daily as needed. 60 tablet 5  . Eszopiclone 3 MG TABS 1 tab po qhs prn insomnia 30 tablet 5  . glipiZIDE (GLUCOTROL XL) 5 MG 24 hr tablet TAKE 1 TABLET (5 MG TOTAL) BY MOUTH DAILY WITH BREAKFAST. 30 tablet 5  . lisinopril (PRINIVIL,ZESTRIL) 10 MG tablet TAKE 1 TABLET BY MOUTH EVERY DAY 30 tablet 5  . meloxicam (MOBIC) 15 MG tablet Take 15 mg by mouth daily as needed for pain.    . promethazine (PHENERGAN) 12.5 MG tablet 1-2 tabs po q6h prn nausea/vomiting 30 tablet 1  . propranolol (INDERAL) 10 MG tablet Take 10 mg by mouth 3 (three) times daily. Rx'd by psych    . azithromycin (ZITHROMAX) 250 MG tablet 500 mg day 1, then 250 mg QD (Patient not taking: Reported on 06/18/2017) 6 tablet 0  . vortioxetine HBr (TRINTELLIX) 10 MG TABS tablet Take 10 mg by mouth daily.     No facility-administered medications prior to visit.     No Known Allergies  ROS Review of Systems  Constitutional: Negative for appetite change, chills, fatigue and fever.  HENT: Negative for congestion, dental problem, ear pain and sore throat.   Eyes: Negative for discharge, redness and visual disturbance.  Respiratory: Negative for cough, chest tightness, shortness of breath and wheezing.   Cardiovascular: Negative for chest pain, palpitations and leg swelling.  Gastrointestinal: Negative for abdominal pain, blood in stool, diarrhea, nausea and vomiting.  Genitourinary: Negative for difficulty urinating, dysuria, flank pain, frequency, hematuria and urgency.  Musculoskeletal: Negative for arthralgias, back pain, joint swelling, myalgias and neck stiffness.  Skin: Negative for pallor and rash.  Neurological: Negative for dizziness, speech difficulty, weakness and headaches.  Hematological: Negative for adenopathy. Does not bruise/bleed easily.  Psychiatric/Behavioral: Positive for dysphoric mood (chronic-seeing psych). Negative for confusion and sleep disturbance. The  patient is nervous/anxious.     PE; Blood pressure 125/77, pulse 77, temperature 97.9 F (36.6 C), temperature source Oral, resp. rate 16, height 5' 10"  (1.778 m), weight 275 lb 6 oz (124.9 kg), SpO2 97 %. Body mass index is 39.51 kg/m.  Gen: Alert, well appearing.  Patient is oriented to person, place, time, and situation. AFFECT: pleasant, lucid thought and speech. ENT: Ears: EACs clear, normal epithelium.  TMs with good light reflex and landmarks bilaterally.  Eyes: no injection, icteris, swelling, or exudate.  EOMI, PERRLA. Nose: no drainage or turbinate edema/swelling.  No injection or focal lesion.  Mouth: lips without lesion/swelling.  Oral mucosa pink and moist.  Dentition intact and without obvious caries or gingival swelling.  Oropharynx without erythema, exudate, or swelling.  Neck: supple/nontender.  No LAD, mass, or TM.  Carotid pulses 2+ bilaterally, without bruits. CV: RRR, no m/r/g.   LUNGS: CTA bilat, nonlabored resps, good aeration in all lung fields. ABD: soft, NT, ND, BS normal.  No hepatospenomegaly or mass.  No bruits. EXT: no clubbing, cyanosis, or edema.  Musculoskeletal: no joint swelling, erythema, warmth, or tenderness.  ROM of all joints intact. Skin - no sores or suspicious lesions or rashes or color changes  Pertinent labs:  Lab Results  Component Value Date   TSH 1.03 06/08/2015   Lab Results  Component Value Date   WBC 7.4 01/23/2016   HGB 15.9 01/23/2016   HCT 46.2 01/23/2016   MCV 94.7 01/23/2016   PLT 145.0 (L) 01/23/2016   Lab Results  Component Value Date   CREATININE 0.97 04/16/2017   BUN 15 04/16/2017   NA 137 04/16/2017   K 4.0 04/16/2017   CL 102 04/16/2017   CO2 26 04/16/2017   Lab Results  Component Value Date   ALT 46 04/16/2017   AST 35 04/16/2017   ALKPHOS 63 04/16/2017   BILITOT 0.5 04/16/2017   Lab Results  Component Value Date   CHOL 230 (H) 04/16/2017   Lab Results  Component Value Date   HDL 33.90 (L) 04/16/2017    Lab Results  Component Value Date   LDLCALC 141 (H) 06/08/2015   Lab Results  Component Value Date   TRIG 343.0 (H) 04/16/2017   Lab Results  Component Value Date   CHOLHDL 7 04/16/2017   Lab Results  Component Value Date   HGBA1C 5.6 04/16/2017    ASSESSMENT AND PLAN:   1) Health maintenance exam: Reviewed age and gender appropriate health maintenance issues (prudent diet, regular exercise, health risks of tobacco and excessive alcohol, use of seatbelts, fire alarms in home, use of sunscreen).  Also reviewed age and gender appropriate health screening as well as vaccine recommendations. Vaccines: all UTD Labs: TSH, CMET, CBC (A1c, FLP not due)--had 1/2 coke this morning. UDS: today.  Most recent clonaz was this morning.Also yesterday in evening. Prostate ca screening: average risk patient= start screening at age 17 yrs. Colon ca screening: average risk patient: recall 2020--Dr. Carlean Purl.  2) Hyperlipidemia w/diabetes: start atorvastatin 40 mg qd.  3) GAD with phobia of flying: I told him he could take 2 of his 23m clonazepam bid the day he flies out for his honeymoon and the day he flies back home.  This may result in him needing an early RF in the near future. Need to obtain UDS today (clonazepam): specimen obtained. CSC UTD (04/16/17).  An After Visit Summary was printed and given to the patient.  FOLLOW UP:  Return in about 3 months (around 09/17/2017) for routine chronic illness f/u (fasting).  Signed:  PCrissie Sickles MD           06/18/2017

## 2017-06-21 LAB — PAIN MGMT, PROFILE 8 W/CONF, U
6 Acetylmorphine: NEGATIVE ng/mL (ref <10)
ALCOHOL METABOLITES: POSITIVE ng/mL — AB (ref <500)
ALPHAHYDROXYMIDAZOLAM: NEGATIVE ng/mL (ref <50)
Alphahydroxyalprazolam: NEGATIVE ng/mL (ref <25)
Alphahydroxytriazolam: NEGATIVE ng/mL (ref <50)
Aminoclonazepam: 134 ng/mL — ABNORMAL HIGH (ref <25)
Amphetamines: NEGATIVE ng/mL (ref <500)
Benzodiazepines: POSITIVE ng/mL — AB (ref <100)
Buprenorphine, Urine: NEGATIVE ng/mL (ref <5)
COCAINE METABOLITE: NEGATIVE ng/mL (ref <150)
CREATININE: 178 mg/dL
ETHYL GLUCURONIDE (ETG): 4837 ng/mL — AB (ref <500)
Ethyl Sulfate (ETS): 1870 ng/mL — ABNORMAL HIGH (ref <100)
Hydroxyethylflurazepam: NEGATIVE ng/mL (ref <50)
LORAZEPAM: NEGATIVE ng/mL (ref <50)
MDMA: NEGATIVE ng/mL (ref <500)
Marijuana Metabolite: NEGATIVE ng/mL (ref <20)
NORDIAZEPAM: NEGATIVE ng/mL (ref <50)
OPIATES: NEGATIVE ng/mL (ref <100)
OXAZEPAM: NEGATIVE ng/mL (ref <50)
Oxidant: NEGATIVE ug/mL (ref <200)
Oxycodone: NEGATIVE ng/mL (ref <100)
PH: 5.18 (ref 4.5–9.0)
Temazepam: NEGATIVE ng/mL (ref <50)

## 2017-07-23 DIAGNOSIS — F10120 Alcohol abuse with intoxication, uncomplicated: Secondary | ICD-10-CM | POA: Diagnosis not present

## 2017-07-23 DIAGNOSIS — F411 Generalized anxiety disorder: Secondary | ICD-10-CM | POA: Diagnosis not present

## 2017-07-23 DIAGNOSIS — F410 Panic disorder [episodic paroxysmal anxiety] without agoraphobia: Secondary | ICD-10-CM | POA: Diagnosis not present

## 2017-09-15 ENCOUNTER — Ambulatory Visit: Payer: BLUE CROSS/BLUE SHIELD | Admitting: Family Medicine

## 2017-09-25 ENCOUNTER — Telehealth: Payer: Self-pay | Admitting: Family Medicine

## 2017-09-25 NOTE — Telephone Encounter (Signed)
This medication is not managed by Dr. Anitra Lauth it is managed by Psych. We have not received a refill request for this medication.   Pt advised and voiced understanding.

## 2017-09-25 NOTE — Telephone Encounter (Signed)
Pt is wanting to see why his rx was denied refill. Please call as soon as possible.

## 2017-09-25 NOTE — Telephone Encounter (Signed)
Copied from [place] [phone]. Topic: Quick Communication - Rx Refill/Question >> Sep 25, 2017  3:00 PM Neva Seat wrote: propranolol (INDERAL) 10 MG tablet  Pt is out of Rx -needing refills  [facility] [address], [place], [place] [address]

## 2017-10-02 ENCOUNTER — Ambulatory Visit: Payer: BLUE CROSS/BLUE SHIELD | Admitting: Family Medicine

## 2017-10-14 ENCOUNTER — Other Ambulatory Visit: Payer: Self-pay | Admitting: Family Medicine

## 2017-10-20 ENCOUNTER — Encounter: Payer: Self-pay | Admitting: Family Medicine

## 2017-10-20 ENCOUNTER — Ambulatory Visit (INDEPENDENT_AMBULATORY_CARE_PROVIDER_SITE_OTHER): Payer: BLUE CROSS/BLUE SHIELD | Admitting: Family Medicine

## 2017-10-20 VITALS — BP 112/71 | HR 73 | Temp 97.8°F | Resp 16 | Ht 70.0 in | Wt 277.1 lb

## 2017-10-20 DIAGNOSIS — I10 Essential (primary) hypertension: Secondary | ICD-10-CM

## 2017-10-20 DIAGNOSIS — K7581 Nonalcoholic steatohepatitis (NASH): Secondary | ICD-10-CM

## 2017-10-20 DIAGNOSIS — E7800 Pure hypercholesterolemia, unspecified: Secondary | ICD-10-CM | POA: Diagnosis not present

## 2017-10-20 DIAGNOSIS — Z23 Encounter for immunization: Secondary | ICD-10-CM | POA: Diagnosis not present

## 2017-10-20 DIAGNOSIS — E119 Type 2 diabetes mellitus without complications: Secondary | ICD-10-CM

## 2017-10-20 DIAGNOSIS — E669 Obesity, unspecified: Secondary | ICD-10-CM

## 2017-10-20 LAB — COMPREHENSIVE METABOLIC PANEL
ALK PHOS: 69 U/L (ref 39–117)
ALT: 96 U/L — ABNORMAL HIGH (ref 0–53)
AST: 59 U/L — ABNORMAL HIGH (ref 0–37)
Albumin: 4.3 g/dL (ref 3.5–5.2)
BUN: 10 mg/dL (ref 6–23)
CO2: 26 meq/L (ref 19–32)
Calcium: 9.1 mg/dL (ref 8.4–10.5)
Chloride: 106 mEq/L (ref 96–112)
Creatinine, Ser: 0.99 mg/dL (ref 0.40–1.50)
GFR: 88.45 mL/min (ref >60.00)
Glucose, Bld: 99 mg/dL (ref 70–99)
POTASSIUM: 4 meq/L (ref 3.5–5.1)
Sodium: 139 mEq/L (ref 135–145)
Total Bilirubin: 0.4 mg/dL (ref 0.2–1.2)
Total Protein: 6.9 g/dL (ref 6.0–8.3)

## 2017-10-20 LAB — HEMOGLOBIN A1C: HEMOGLOBIN A1C: 6 % (ref 4.6–6.5)

## 2017-10-20 MED ORDER — CLONAZEPAM 1 MG PO TABS: 1.0000 mg | ORAL_TABLET | Freq: Two times a day (BID) | ORAL | 5 refills | Status: DC | PRN

## 2017-10-20 NOTE — Progress Notes (Signed)
OFFICE VISIT  10/20/2017   CC:  Chief Complaint  Patient presents with  . Follow-up    RCI, pt is fasting.    HPI:    Patient is a 42 y.o. Caucasian male who presents for 3 mo f/u DM 2, HTN, HLD, chronic anxiety.  Married now!    DM: no home glucose monitoring.  Denies any feeling of hypoglycemia.   Feet--> no burning, tingling, or numbness in feet.  HTN: no home bp monitoring.  HLD: he has not started this med yet.  Anxiety: taking clonazepam bid, says it helps.  ROS: no CP, no SOB, no wheezing, no cough, no dizziness, no HAs, no rashes, no melena/hematochezia.  No polyuria or polydipsia.  No myalgias or arthralgias.   Past Medical History:  Diagnosis Date  . Abnormal weight gain 05/25/2015  . Alcohol abuse 2016/2017   Pt states he was self medicating his anxiety.  Quit 04/2015.  Minimal intake as of 05/2017 f/u.  Marland Kitchen Anal fissure   . Anxiety   . Diabetes mellitus ([place]) 12/2015   New dx 12/2015  . Hepatic steatosis 2015; 2016; 02/2016   +Alcohol has played a role.  CT abd showed fatty liver+ liver enzymes mildly elevated (chron viral hepatitis screening NEG).  Abd u/s 03/26/16 showed fatty liver, small GB polyps, o/w normal.  . History of kidney stones    [facility]  . HTN (hypertension)   . Hx of colonic polyp - ssp 02/11/2014  . Hyperlipidemia    Plan was to recommend statin 04/2017 but unable to make phone contact with pt.  . Insomnia   . Obesity, Class II, BMI 35-39.9   . Psoriasis     Past Surgical History:  Procedure Laterality Date  . COLONOSCOPY W/ BIOPSIES  02/03/14   Cecal polyp (sessile serrated polyp w/out dysplasia) and posterior anal fissure; repeat TCS 2020 per Dr. Carlean Purl  . LAPAROSCOPIC APPENDECTOMY N/A 02/24/2015   Procedure: APPENDECTOMY LAPAROSCOPIC;  Surgeon: Alphonsa Overall, MD;  Location: [facility];  Service: General;  Laterality: N/A;  . right ankle     2014    Outpatient Medications Prior to Visit  Medication Sig Dispense Refill  .  Carbamazepine (EQUETRO) 100 MG CP12 12 hr capsule Take 300 mg by mouth daily. Rx'd by psych     . Eszopiclone 3 MG TABS 1 tab po qhs prn insomnia 30 tablet 5  . glipiZIDE (GLUCOTROL XL) 5 MG 24 hr tablet TAKE 1 TABLET (5 MG TOTAL) BY MOUTH DAILY WITH BREAKFAST. 30 tablet 5  . lisinopril (PRINIVIL,ZESTRIL) 10 MG tablet TAKE 1 TABLET BY MOUTH EVERY DAY 30 tablet 5  . meloxicam (MOBIC) 15 MG tablet Take 15 mg by mouth daily as needed for pain.    . promethazine (PHENERGAN) 12.5 MG tablet 1-2 tabs po q6h prn nausea/vomiting 30 tablet 1  . propranolol (INDERAL) 10 MG tablet Take 10 mg by mouth 3 (three) times daily. Rx'd by psych    . clonazePAM (KLONOPIN) 1 MG tablet Take 1 tablet (1 mg total) by mouth 2 (two) times daily as needed. 60 tablet 5  . atorvastatin (LIPITOR) 20 MG tablet TAKE 1 TABLET BY MOUTH EVERY DAY (Patient not taking: Reported on 10/20/2017) 90 tablet 0   No facility-administered medications prior to visit.     No Known Allergies  ROS As per HPI  PE: Blood pressure 112/71, pulse 73, temperature 97.8 F (36.6 C), temperature source Oral, resp. rate 16, height 5' 10"  (1.778 m), weight 277  lb 2 oz (125.7 kg), SpO2 97 %. Gen: Alert, well appearing.  Patient is oriented to person, place, time, and situation. AFFECT: pleasant, lucid thought and speech. CV: RRR, no m/r/g.   LUNGS: CTA bilat, nonlabored resps, good aeration in all lung fields. EXT: no clubbing or cyanosis.  no edema.  Foot exam - bilateral normal; no swelling, tenderness or skin or vascular lesions. Color and temperature is normal. Sensation is intact. Peripheral pulses are palpable. Toenails are normal.   LABS:  Lab Results  Component Value Date   TSH 1.47 06/18/2017   Lab Results  Component Value Date   WBC 5.4 06/18/2017   HGB 15.2 06/18/2017   HCT 44.3 06/18/2017   MCV 98.2 06/18/2017   PLT 166.0 06/18/2017   Lab Results  Component Value Date   CREATININE 0.88 06/18/2017   BUN 15 06/18/2017   NA  135 06/18/2017   K 4.1 06/18/2017   CL 103 06/18/2017   CO2 24 06/18/2017   Lab Results  Component Value Date   ALT 68 (H) 06/18/2017   AST 54 (H) 06/18/2017   ALKPHOS 82 06/18/2017   BILITOT 0.4 06/18/2017   Lab Results  Component Value Date   CHOL 230 (H) 04/16/2017   Lab Results  Component Value Date   HDL 33.90 (L) 04/16/2017   Lab Results  Component Value Date   LDLCALC 141 (H) 06/08/2015   Lab Results  Component Value Date   TRIG 343.0 (H) 04/16/2017   Lab Results  Component Value Date   CHOLHDL 7 04/16/2017   Lab Results  Component Value Date   HGBA1C 5.6 04/16/2017    IMPRESSION AND PLAN:  1) DM 2:  HbA1c today. Lytes/cr today. Feet exam normal today.  2) HTN: The current medical regimen is effective;  continue present plan and medications. Lytes/cr today.  3) HLD: pt agrees to start trial of atorvastatin today. Recheck FLP at next f/u in 4 mo.  4) Chronic anxiety: The current medical regimen is effective;  continue present plan and medications. CSC and UDS UTD. Clonaz rx faxed to pharmacy today.   An After Visit Summary was printed and given to the patient.  FOLLOW UP: Return in about 4 months (around 02/19/2018) for routine chronic illness f/u.  Signed:  Crissie Sickles, MD           10/20/2017

## 2017-10-21 ENCOUNTER — Other Ambulatory Visit: Payer: Self-pay | Admitting: Family Medicine

## 2017-10-21 ENCOUNTER — Encounter: Payer: Self-pay | Admitting: *Deleted

## 2017-10-24 ENCOUNTER — Other Ambulatory Visit: Payer: Self-pay

## 2017-10-24 MED ORDER — ESZOPICLONE 3 MG PO TABS: ORAL_TABLET | ORAL | 5 refills | Status: DC

## 2017-11-06 ENCOUNTER — Other Ambulatory Visit: Payer: Self-pay | Admitting: Family Medicine

## 2017-11-06 NOTE — Telephone Encounter (Signed)
RF request for promethazine LOV: 10/20/17 Next ov: 02/12/18 Last written: 09/03/16 #30 w/ 1RF  Please advise. Thanks.

## 2017-12-03 DIAGNOSIS — F3132 Bipolar disorder, current episode depressed, moderate: Secondary | ICD-10-CM | POA: Diagnosis not present

## 2018-01-15 DIAGNOSIS — F411 Generalized anxiety disorder: Secondary | ICD-10-CM | POA: Diagnosis not present

## 2018-02-11 ENCOUNTER — Telehealth: Payer: Self-pay | Admitting: Family Medicine

## 2018-02-11 MED ORDER — BLOOD GLUCOSE METER KIT: PACK | 0 refills | Status: DC

## 2018-02-11 NOTE — Telephone Encounter (Signed)
Copied from [place] [phone]. Topic: Quick Communication - See Telephone Encounter >> Feb 11, 2018  2:51 PM [name] wrote: CRM for notification. See Telephone encounter for: 02/11/18.  Pt states that his glucose meter is not working and would like Dr. Anitra Lauth to send in RX for new meter and supplies. He has not been monitoring like he should.  [id number] [place] [phone](Phone) [phone](Fax)

## 2018-02-11 NOTE — Telephone Encounter (Signed)
Rx for blood glucose meter printed, waiting on Dr. Anitra Lauth to sign.

## 2018-02-12 ENCOUNTER — Ambulatory Visit: Payer: BLUE CROSS/BLUE SHIELD | Admitting: Family Medicine

## 2018-02-12 NOTE — Telephone Encounter (Signed)
Rx signed and faxed.   Tried calling pt, NA and unable to leave a message due to vm box being full or not set up yet.

## 2018-02-16 ENCOUNTER — Emergency Department (HOSPITAL_BASED_OUTPATIENT_CLINIC_OR_DEPARTMENT_OTHER)
Admission: EM | Admit: 2018-02-16 | Discharge: 2018-02-16 | Disposition: A | Discharge status: Home / Self Care | Payer: BLUE CROSS/BLUE SHIELD | Attending: Emergency Medicine | Admitting: Emergency Medicine

## 2018-02-16 ENCOUNTER — Other Ambulatory Visit: Payer: Self-pay

## 2018-02-16 ENCOUNTER — Encounter (HOSPITAL_BASED_OUTPATIENT_CLINIC_OR_DEPARTMENT_OTHER): Payer: Self-pay | Admitting: Emergency Medicine

## 2018-02-16 DIAGNOSIS — E119 Type 2 diabetes mellitus without complications: Secondary | ICD-10-CM | POA: Insufficient documentation

## 2018-02-16 DIAGNOSIS — G4709 Other insomnia: Secondary | ICD-10-CM | POA: Insufficient documentation

## 2018-02-16 DIAGNOSIS — F10188 Alcohol abuse with other alcohol-induced disorder: Secondary | ICD-10-CM | POA: Diagnosis not present

## 2018-02-16 DIAGNOSIS — F1010 Alcohol abuse, uncomplicated: Secondary | ICD-10-CM

## 2018-02-16 DIAGNOSIS — I10 Essential (primary) hypertension: Secondary | ICD-10-CM | POA: Diagnosis not present

## 2018-02-16 DIAGNOSIS — R002 Palpitations: Secondary | ICD-10-CM

## 2018-02-16 DIAGNOSIS — Z79899 Other long term (current) drug therapy: Secondary | ICD-10-CM | POA: Diagnosis not present

## 2018-02-16 DIAGNOSIS — F10982 Alcohol use, unspecified with alcohol-induced sleep disorder: Secondary | ICD-10-CM

## 2018-02-16 LAB — ETHANOL: Alcohol, Ethyl (B): 197 mg/dL — ABNORMAL HIGH (ref <10)

## 2018-02-16 LAB — CBC WITH DIFFERENTIAL/PLATELET
ABS IMMATURE GRANULOCYTES: 0.04 10*3/uL (ref 0.00–0.07)
BASOS PCT: 1 %
Basophils Absolute: 0.1 10*3/uL (ref 0.0–0.1)
EOS ABS: 0.2 10*3/uL (ref 0.0–0.5)
Eosinophils Relative: 2 %
HCT: 47.5 % (ref 39.0–52.0)
Hemoglobin: 16.4 g/dL (ref 13.0–17.0)
Immature Granulocytes: 1 %
Lymphocytes Relative: 43 %
Lymphs Abs: 3.2 10*3/uL (ref 0.7–4.0)
MCH: 32.2 pg (ref 26.0–34.0)
MCHC: 34.5 g/dL (ref 30.0–36.0)
MCV: 93.3 fL (ref 80.0–100.0)
MONO ABS: 0.7 10*3/uL (ref 0.1–1.0)
MONOS PCT: 9 %
Neutro Abs: 3.4 10*3/uL (ref 1.7–7.7)
Neutrophils Relative %: 44 %
PLATELETS: 252 10*3/uL (ref 150–400)
RBC: 5.09 MIL/uL (ref 4.22–5.81)
RDW: 11.5 % (ref 11.5–15.5)
WBC: 7.6 10*3/uL (ref 4.0–10.5)
nRBC: 0 % (ref 0.0–0.2)

## 2018-02-16 LAB — COMPREHENSIVE METABOLIC PANEL
ALBUMIN: 4.2 g/dL (ref 3.5–5.0)
ALK PHOS: 79 U/L (ref 38–126)
ALT: 43 U/L (ref 0–44)
AST: 34 U/L (ref 15–41)
Anion gap: 11 (ref 5–15)
BILIRUBIN TOTAL: 0.4 mg/dL (ref 0.3–1.2)
BUN: 11 mg/dL (ref 6–20)
CALCIUM: 8.8 mg/dL — AB (ref 8.9–10.3)
CO2: 22 mmol/L (ref 22–32)
CREATININE: 0.73 mg/dL (ref 0.61–1.24)
Chloride: 104 mmol/L (ref 98–111)
GFR calc Af Amer: >60 mL/min (ref >60)
GFR calc non Af Amer: >60 mL/min (ref >60)
GLUCOSE: 191 mg/dL — AB (ref 70–99)
Potassium: 3.6 mmol/L (ref 3.5–5.1)
Sodium: 137 mmol/L (ref 135–145)
TOTAL PROTEIN: 8 g/dL (ref 6.5–8.1)

## 2018-02-16 LAB — TROPONIN I: Troponin I: <0.03 ng/mL (ref <0.03)

## 2018-02-16 LAB — LIPASE, BLOOD: Lipase: 39 U/L (ref 11–51)

## 2018-02-16 MED ORDER — SODIUM CHLORIDE 0.9 % IV BOLUS
1000.0000 mL | Freq: Once | INTRAVENOUS | Status: AC
  Administered 2018-02-16: 1000 mL via INTRAVENOUS

## 2018-02-16 MED ORDER — CHLORDIAZEPOXIDE HCL 25 MG PO CAPS: ORAL_CAPSULE | ORAL | 0 refills | Status: DC

## 2018-02-16 MED ORDER — LORAZEPAM 2 MG/ML IJ SOLN
1.0000 mg | Freq: Once | INTRAMUSCULAR | Status: AC
  Administered 2018-02-16: 1 mg via INTRAVENOUS
  Filled 2018-02-16: qty 1

## 2018-02-16 NOTE — Discharge Instructions (Addendum)
You were seen today for symptoms likely related to binge drinking and alcohol use.  You will be provided with a Librium taper to help taper you off alcohol.  However, this does not address ongoing alcohol issues.  You should not drink while taking Librium.  Follow-up closely with your primary doctor and see resources provided.  If you develop increasing depressive symptoms, thoughts of wanting to hurt yourself or anyone else, any new or worsening symptoms you should be reevaluated.

## 2018-02-16 NOTE — ED Provider Notes (Signed)
[facility] EMERGENCY DEPARTMENT Provider Note   CSN: [id number] Arrival date & time: 02/16/18  0356     History   Chief Complaint Chief Complaint  Patient presents with  . Alcohol Problem    HPI Tony [name] is a 42 y.o. male.  HPI  This is a 42 year old male with a history of diabetes, hypertension, hyperlipidemia and prior history of alcohol abuse who presents with palpitations, increasing depressive symptoms, and a 6-day drinking binge.  Patient reports over the last 6 days he has been drinking liquor daily.  He cannot quantify how much.  He states "it is not 1/5."  Prior to this he reports that he drank only occasionally.  I do note in his chart he had history of self-medicating with alcohol but quit in March 2017.  He denies any history of withdrawal.  He states that over the last 6 days he has had increasing insomnia.  He reports only getting up to 2 hours of sleep at night.  He reports palpitations and heart fluttering if he stops drinking.  He also reports that tonight he had onset of some right-sided abdominal pain.  It was self-limited.  He is no longer having any pain but "I am worried about my liver."  Patient denies any SI or HI.  He reports minimal oral intake over the last week.  Patient is very tearful upon history taking.  I have asked him if he has increased stressors at home or any reason why he may have been on a binge over the last 6 days.  Currently he does not want to talk about it.  He did endorse to nursing that he had a friend die 1 week ago and has had increased stress from his in-laws.  He also notes that his wife does not know that he has been drinking more per nursing notes.  Past Medical History:  Diagnosis Date  . Abnormal weight gain 05/25/2015  . Alcohol abuse 2016/2017   Pt states he was self medicating his anxiety.  Quit 04/2015.  Minimal intake as of 05/2017 f/u.  Marland Kitchen Anal fissure   . Anxiety   . Diabetes mellitus ([place]) 12/2015   New dx  12/2015  . Hepatic steatosis 2015; 2016; 02/2016   +Alcohol has played a role.  CT abd showed fatty liver+ liver enzymes mildly elevated (chron viral hepatitis screening NEG).  Abd u/s 03/26/16 showed fatty liver, small GB polyps, o/w normal.  . History of kidney stones    [facility]  . HTN (hypertension)   . Hx of colonic polyp - ssp 02/11/2014  . Hyperlipidemia    Plan was to recommend statin 04/2017 but unable to make phone contact with pt.  . Insomnia   . Obesity, Class II, BMI 35-39.9   . Psoriasis     Patient Active Problem List   Diagnosis Date Noted  . Abnormal weight gain 05/25/2015  . Hyperlipidemia 05/25/2015  . Acute appendicitis 02/24/2015  . Perianal dermatitis 04/19/2014  . Hx of colonic polyp - ssp 02/11/2014  . Anal fissure - posterior 02/03/2014  . Abdominal pain, chronic, right lower quadrant 01/13/2014  . Essential hypertension 12/08/2009  . BACK PAIN 12/08/2009  . INSOMNIA 01/18/2008  . GAD (generalized anxiety disorder) 09/04/2007  . SORE THROAT 09/04/2007  . INTERMITTENT VERTIGO 03/09/2007  . ELEVATED BLOOD PRESSURE WITHOUT DIAGNOSIS OF HYPERTENSION 03/09/2007    Past Surgical History:  Procedure Laterality Date  . COLONOSCOPY W/ BIOPSIES  02/03/14  Cecal polyp (sessile serrated polyp w/out dysplasia) and posterior anal fissure; repeat TCS 2020 per Dr. Carlean Purl  . LAPAROSCOPIC APPENDECTOMY N/A 02/24/2015   Procedure: APPENDECTOMY LAPAROSCOPIC;  Surgeon: Alphonsa Overall, MD;  Location: [facility];  Service: General;  Laterality: N/A;  . right ankle     2014        Home Medications    Prior to Admission medications   Medication Sig Start Date End Date Taking? Authorizing Provider  atorvastatin (LIPITOR) 20 MG tablet TAKE 1 TABLET BY MOUTH EVERY DAY Patient not taking: Reported on 10/20/2017 10/14/17   Tammi Sou, MD  blood glucose meter kit and supplies Use to check blood sugar once daily. 02/11/18   McGowen, Adrian Blackwater, MD  Carbamazepine (EQUETRO)  100 MG CP12 12 hr capsule Take 300 mg by mouth daily. Rx'd by psych     [provider]  chlordiazePOXIDE (LIBRIUM) 25 MG capsule 10m PO TID x 1D, then 25-569mPO BID X 1D, then 25-5061mO QD X 1D 02/16/18   Horton, CouBarbette HairD  clonazePAM (KLONOPIN) 1 MG tablet Take 1 tablet (1 mg total) by mouth 2 (two) times daily as needed. 10/20/17   McGowen, PhiAdrian BlackwaterD  Eszopiclone 3 MG TABS 1 tab po qhs prn insomnia 10/24/17   McGowen, PhiAdrian BlackwaterD  glipiZIDE (GLUCOTROL XL) 5 MG 24 hr tablet TAKE 1 TABLET (5 MG TOTAL) BY MOUTH DAILY WITH BREAKFAST. 10/21/17   McGowen, PhiAdrian BlackwaterD  lisinopril (PRINIVIL,ZESTRIL) 10 MG tablet TAKE 1 TABLET BY MOUTH EVERY DAY 10/21/17   McGowen, PhiAdrian BlackwaterD  meloxicam (MOBIC) 15 MG tablet Take 15 mg by mouth daily as needed for pain.    [provider]  promethazine (PHENERGAN) 12.5 MG tablet 1-2 TABS BY MOUTH EVERY 6 HRS AS NEEDED FOR NAUSEA/VOMITING 11/08/17   McGowen, PhiAdrian BlackwaterD  propranolol (INDERAL) 10 MG tablet Take 10 mg by mouth 3 (three) times daily. Rx'd by psych    [provider]    Family History Family History  Problem Relation Age of Onset  . Diabetes Mother   . Restless legs syndrome Mother   . Heart disease Mother   . Heart attack Mother 53 49 Psoriasis Father   . Stroke Neg Hx   . Cancer Neg Hx     Social History Social History   Tobacco Use  . Smoking status: Never Smoker  . Smokeless tobacco: Never Used  Substance Use Topics  . Alcohol use: Yes    Alcohol/week: 0.0 standard drinks    Comment: 1/5 liquor a day  . Drug use: No     Allergies   Patient has no known allergies.   Review of Systems Review of Systems  Constitutional: Negative for fever.  Respiratory: Negative for shortness of breath.   Cardiovascular: Positive for palpitations. Negative for chest pain.  Gastrointestinal: Positive for abdominal pain and nausea. Negative for vomiting.  Psychiatric/Behavioral: Negative for suicidal ideas. The  patient is nervous/anxious.   All other systems reviewed and are negative.    Physical Exam Updated Vital Signs BP (!) 160/96   Pulse 95   Temp 98 F (36.7 C)   Resp 20   Ht 1.778 m (5' 10")   Wt 117.9 kg   SpO2 94%   BMI 37.31 kg/m   Physical Exam Vitals signs and nursing note reviewed.  Constitutional:      Appearance: He is well-developed.     Comments: Overweight, anxious appearing, tearful  HENT:     Head: Normocephalic and atraumatic.  Eyes:     Pupils: Pupils are equal, round, and reactive to light.  Neck:     Musculoskeletal: Neck supple.  Cardiovascular:     Rate and Rhythm: Regular rhythm. Tachycardia present.     Heart sounds: Normal heart sounds. No murmur.  Pulmonary:     Effort: Pulmonary effort is normal. No respiratory distress.     Breath sounds: Normal breath sounds. No wheezing.  Abdominal:     General: Bowel sounds are normal.     Palpations: Abdomen is soft.     Tenderness: There is no abdominal tenderness. There is no rebound.  Musculoskeletal:        General: No swelling.  Lymphadenopathy:     Cervical: No cervical adenopathy.  Skin:    General: Skin is warm and dry.  Neurological:     Mental Status: He is alert and oriented to person, place, and time.  Psychiatric:     Comments: Tearful and anxious      ED Treatments / Results  Labs (all labs ordered are listed, but only abnormal results are displayed) Labs Reviewed  COMPREHENSIVE METABOLIC PANEL - Abnormal; Notable for the following components:      Result Value   Glucose, Bld 191 (*)    Calcium 8.8 (*)    All other components within normal limits  ETHANOL - Abnormal; Notable for the following components:   Alcohol, Ethyl (B) 197 (*)    All other components within normal limits  CBC WITH DIFFERENTIAL/PLATELET  TROPONIN I  LIPASE, BLOOD  RAPID URINE DRUG SCREEN, HOSP PERFORMED    EKG EKG Interpretation  Date/Time:  Monday February 16 2018 04:07:46 EST Ventricular Rate:   101 PR Interval:    QRS Duration: 90 QT Interval:  320 QTC Calculation: 415 R Axis:   11 Text Interpretation:  Sinus tachycardia LVH with secondary repolarization abnormality Confirmed by Thayer Jew [phone] on 02/16/2018 4:32:49 AM   Radiology No results found.  Procedures Procedures (including critical care time)  CRITICAL CARE Performed by: Merryl Hacker   Total critical care time: 35 minutes  Critical care time was exclusive of separately billable procedures and treating other patients.  Critical care was necessary to treat or prevent imminent or life-threatening deterioration.  Critical care was time spent personally by me on the following activities: development of treatment plan with patient and/or surrogate as well as nursing, discussions with consultants, evaluation of patient's response to treatment, examination of patient, obtaining history from patient or surrogate, ordering and performing treatments and interventions, ordering and review of laboratory studies, ordering and review of radiographic studies, pulse oximetry and re-evaluation of patient's condition.   Medications Ordered in ED Medications  LORazepam (ATIVAN) injection 1 mg (1 mg Intravenous Given 02/16/18 0431)  sodium chloride 0.9 % bolus 1,000 mL (0 mLs Intravenous Stopped 02/16/18 0535)     Initial Impression / Assessment and Plan / ED Course  I have reviewed the triage vital signs and the nursing notes.  Pertinent labs & imaging results that were available during my care of the patient were reviewed by me and considered in my medical decision making (see chart for details).  Clinical Course as of Feb 17 547  Mon Feb 16, 2018  0509 Updated patient on his lab work.  He was relieved that his liver function tests are reassuring.  Otherwise his alcohol level is the only lab that is out of normal range.  I was discussing with him that I felt like his binge drinking was likely causing his  insomnia and palpitations.  His wife arrived during this discussion.  Patient stated that he was comfortable with me continuing discussion with his wife in the room.  I discussed with him that I felt like he had an alcohol problem.  I happy to provide him with outpatient resources and he needs close follow-up with his primary doctor.  He may need an SSRI or other antidepressant or medication to help him cope with anxiety and ongoing stress.  Wife clearly was not aware of his binge drinking over the last 5 to 6 days.  I offered to talk to her as well although the patient stated "I will update you."  Patient was left with his wife.   [CH]  1017 I had a long discussion with the patient and his wife at the bedside.  Wife did not realize he had been drinking.  I discussed with him that I am happy to provide him with a Librium taper as he has no history of withdrawal; however, he cannot drink while taking this.  Also discussed with him that in the big picture he would need to address his alcohol dependence and that medications are not going to fix it.  Patient deems to be in some denial regarding this.  I have reinforced that his wife appears supportive.  Also appears that he has a primary doctor who follows him closely and have reinforced that this can be a great resource.  I will provide him with outpatient resources and a Librium taper.  At this time he has no signs of acute withdrawal.  His vital signs are reassuring.  Again he denies any SI or HI.  He does appear clinically depressed to me.   [CH]    Clinical Course User Index [CH] Arlayne Liggins, Barbette Hair, MD    Presents with palpitations and insomnia in the setting of recent binge drinking.  Based on chart review, it appears that he has previously had issues with drinking to cope with anxiety and stress.  Initially he is not very forthcoming with increasing stressors.  I suspect his insomnia and his palpitations are related to drinking.  He has no signs of  withdrawal right now.  He was given Ativan and fluids.  Lab work obtained and is largely reassuring.  LFTs are actually improved from prior.  See clinical course above.  Patient was informed of lab testing and reassuring results.  He clinically appears very depressed and anxious.  Wife did come to the bedside and was updated.  Patient shared with her his binge drinking over the last 6 days for which she was not aware.  She appears very supportive.  I spent greater than 20 minutes in the room counseling the patient regarding alcohol use disorders and my concerns that he needs professional help.  Luckily, he has a primary physician for which he follows with closely.  I have sent him a message for follow-up.  Patient will be given a Librium taper and was instructed on the uses.  After history, exam, and medical workup I feel the patient has been appropriately medically screened and is safe for discharge home. Pertinent diagnoses were discussed with the patient. Patient was given return precautions.   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

## 2018-02-16 NOTE — ED Triage Notes (Addendum)
Pt arrives via EMS for increased alcohol use this week, usually drinks a couple beers a day, now drinking 1/5 liquor a day. Reports nausea and sweating, intermittent palpitations. Last drink 30 minutes ago. States he's been on a six day binge. Reports last week a friend died. Reports R sided pain "gurgling", concerned about his liver.   Reports "inlaws" are causing a significant amount of stress. Reports his wife doesn't know that he's been drinking.

## 2018-02-16 NOTE — ED Notes (Signed)
Patient left at this time with all belongings. 

## 2018-02-16 NOTE — ED Notes (Signed)
ED Provider at bedside. 

## 2018-03-03 ENCOUNTER — Inpatient Hospital Stay: Payer: BLUE CROSS/BLUE SHIELD | Admitting: Family Medicine

## 2018-03-18 ENCOUNTER — Ambulatory Visit: Payer: BLUE CROSS/BLUE SHIELD | Admitting: Family Medicine

## 2018-04-23 ENCOUNTER — Other Ambulatory Visit: Payer: Self-pay | Admitting: Family Medicine

## 2018-04-23 MED ORDER — LISINOPRIL 10 MG PO TABS: 10.0000 mg | ORAL_TABLET | Freq: Every day | ORAL | 0 refills | Status: DC

## 2018-04-23 MED ORDER — GLIPIZIDE ER 5 MG PO TB24: 5.0000 mg | ORAL_TABLET | Freq: Every day | ORAL | 0 refills | Status: DC

## 2018-04-23 NOTE — Telephone Encounter (Signed)
Copied from [place] [phone]. Topic: Quick Communication - Rx Refill/Question >> Apr 23, 2018 12:58 PM [name] wrote: Medication: lisinopril (PRINIVIL,ZESTRIL) 10 MG tablet - pt has 2 left glipiZIDE (GLUCOTROL XL) 5 MG 24 hr tablet - pt is out  Has the patient contacted their pharmacy? Yes - was told to contact his doctor since out and no refills Preferred Pharmacy (with phone number or street name): [id number] [place] [phone](Phone) [phone](Fax)

## 2018-04-23 NOTE — Telephone Encounter (Signed)
Call to patient- left message to call office to schedule 6 month follow up for medications. 1 month courtesy refill given. Requested Prescriptions  Pending Prescriptions Disp Refills  . lisinopril (PRINIVIL,ZESTRIL) 10 MG tablet 30 tablet 0    Sig: Take 1 tablet (10 mg total) by mouth daily.     Cardiovascular:  ACE Inhibitors Failed - 04/23/2018  1:05 PM      Failed - Valid encounter within last 6 months    Recent Outpatient Visits          6 months ago Diabetes mellitus without complication ([place])   [place], Adrian Blackwater, MD   10 months ago Hypercholesterolemia   [place] McGowen, Adrian Blackwater, MD   11 months ago [place], Renee A, DO   1 year ago Diabetes mellitus without complication [facility])   [place], Adrian Blackwater, MD   1 year ago Essential hypertension   [place], Adrian Blackwater, MD             Passed - Cr in normal range and within 180 days    Creatinine, Ser  Date Value Ref Range Status  02/16/2018 0.73 0.61 - 1.24 mg/dL Final         Passed - K in normal range and within 180 days    Potassium  Date Value Ref Range Status  02/16/2018 3.6 3.5 - 5.1 mmol/L Final         Passed - Patient is not pregnant      Passed - Last BP in normal range    BP Readings from Last 1 Encounters:  10/20/17 112/71       . glipiZIDE (GLUCOTROL XL) 5 MG 24 hr tablet 30 tablet 0    Sig: Take 1 tablet (5 mg total) by mouth daily with breakfast.     Endocrinology:  Diabetes - Sulfonylureas Failed - 04/23/2018  1:05 PM      Failed - HBA1C is between 0 and 7.9 and within 180 days    Hgb A1c MFr Bld  Date Value Ref Range Status  10/20/2017 6.0 4.6 - 6.5 % Final    Comment:    Glycemic Control Guidelines for People with Diabetes:Non Diabetic:  <6%Goal of Therapy: <7%Additional Action Suggested:  >8%          Failed - Valid encounter  within last 6 months    Recent Outpatient Visits          6 months ago Diabetes mellitus without complication ([place])   [place], Adrian Blackwater, MD   10 months ago Hypercholesterolemia   [place] McGowen, Adrian Blackwater, MD   11 months ago [place], Renee A, DO   1 year ago Diabetes mellitus without complication [facility])   [place], Adrian Blackwater, MD   1 year ago Essential hypertension   [place], Adrian Blackwater, MD

## 2018-05-17 ENCOUNTER — Other Ambulatory Visit: Payer: Self-pay | Admitting: Family Medicine

## 2018-05-20 ENCOUNTER — Other Ambulatory Visit: Payer: Self-pay | Admitting: Family Medicine

## 2018-05-21 ENCOUNTER — Telehealth: Payer: Self-pay | Admitting: Family Medicine

## 2018-05-21 MED ORDER — GLIPIZIDE ER 5 MG PO TB24: 5.0000 mg | ORAL_TABLET | Freq: Every day | ORAL | 0 refills | Status: DC

## 2018-05-21 NOTE — Telephone Encounter (Signed)
I'll do 90d rx for his glipizide. Needs routine office visit f/u in 3 months.-thx

## 2018-05-21 NOTE — Telephone Encounter (Signed)
Patient advised and voiced understanding.  

## 2018-05-22 ENCOUNTER — Other Ambulatory Visit: Payer: Self-pay | Admitting: Family Medicine

## 2018-05-22 NOTE — Telephone Encounter (Signed)
Patient requesting RF clonazepam and eszopiclone.   Last OV 10/20/2017 No upcoming appt Last clonazepam 10/20/17 # 60 x 5 rfs.   Patient does not have appt scheduled but was just told to schedule 3 month yesterday for getting his glipizide filled.    Please advise RF.

## 2018-05-25 NOTE — Telephone Encounter (Signed)
Pt overdue for f/u regarding these controlled substance prescriptions. I'll rx 10d supply of each and he needs to have f/u visit prior to any FURTHER RFs.-thx

## 2018-05-25 NOTE — Telephone Encounter (Signed)
I need pt's PMP Aware document before I consider RF of these two meds.-thx

## 2018-05-25 NOTE — Telephone Encounter (Signed)
PMP aware printed and placed on provider's desk.

## 2018-05-26 NOTE — Telephone Encounter (Signed)
Patient advised and voiced understanding.   Please assist patient with scheduling details and cost, thanks.

## 2018-06-02 ENCOUNTER — Telehealth: Payer: Self-pay | Admitting: Family Medicine

## 2018-06-02 NOTE — Telephone Encounter (Signed)
LM for patient to CB to schedule an virtual visit to refill meds. Patient had requested price, advised patient that cost will be the same as an office visit. Unable to give an exact price.

## 2018-06-03 NOTE — Telephone Encounter (Signed)
Pt was contacted and has appt scheduled for next week.

## 2018-06-09 ENCOUNTER — Ambulatory Visit (INDEPENDENT_AMBULATORY_CARE_PROVIDER_SITE_OTHER): Payer: Self-pay | Admitting: Family Medicine

## 2018-06-09 ENCOUNTER — Encounter: Payer: Self-pay | Admitting: Family Medicine

## 2018-06-09 ENCOUNTER — Other Ambulatory Visit: Payer: Self-pay

## 2018-06-09 DIAGNOSIS — E7800 Pure hypercholesterolemia, unspecified: Secondary | ICD-10-CM

## 2018-06-09 DIAGNOSIS — Z79899 Other long term (current) drug therapy: Secondary | ICD-10-CM

## 2018-06-09 DIAGNOSIS — F411 Generalized anxiety disorder: Secondary | ICD-10-CM

## 2018-06-09 DIAGNOSIS — K7581 Nonalcoholic steatohepatitis (NASH): Secondary | ICD-10-CM

## 2018-06-09 DIAGNOSIS — I10 Essential (primary) hypertension: Secondary | ICD-10-CM

## 2018-06-09 DIAGNOSIS — E119 Type 2 diabetes mellitus without complications: Secondary | ICD-10-CM

## 2018-06-09 NOTE — Progress Notes (Signed)
Virtual Visit via Video Note  I connected with Tony [name]  on 06/09/18 at  9:30 AM EDT by a video enabled telemedicine application and verified that I am speaking with the correct person using two identifiers.  Location patient: home Location provider:work office Persons participating in the virtual visit: patient, myself  I discussed the limitations of evaluation and management by telemedicine and the availability of in person appointments. The patient expressed understanding and agreed to proceed.  Telemedicine visit is a necessity given the COVID-19 restrictions in place at the current time.  HPI: 43 y/o WM here for 8 mo f/u DM 2, HTN, HLD, chronic anxiety. Since I lasts saw him he did go to the ED for palpitations and trouble sleeping that were occurring in the midst of a 6 day alcohol binge.  LFTs were stable/mildly elevated. He was d/c'd home from ED with his wife and was given a librium taper and alcoholic support resources. He has a hx of self medicating his anxiety with alcohol. Psychiatrist:  Dr. Pauline Good. He has not had alcohol since the ED visit 02/16/2018.  DM: he does not have any way to check his sugars. He says he eats a pretty good diabetic diet lately. Takes glipizide daily. However, he does not have good eating pattern in day--sometimes doesn't eat much in daytime. Also, lots of physical activity---feels low.  HTN: takes lisinopril daily.  GAD: still with chronic anxiety, responds well to clonaz 30m bid Taking propranolol as well. His psychiatrist put him on sertraline 02/2018.  He takes 1/2 of 265mtab qd.  HLD: last visit we started him on atorvastatin and planned recheck lipid panel in 2 mo but he did not return for this lab. He did not get this med.  ROS: no CP, no SOB, no wheezing, no cough, no dizziness, no HAs, no rashes, no melena/hematochezia.  No polyuria or polydipsia.  No myalgias or arthralgias.   Past Medical History:  Diagnosis Date  . Abnormal  weight gain 05/25/2015  . Alcohol abuse 2016/2017   Tony [name] states he was self medicating his anxiety.  Quit 04/2015.  Minimal intake as of 05/2017 f/u.  . Marland Kitchennal fissure   . Anxiety   . Diabetes mellitus ([facility]/2017   New dx 12/2015  . Hepatic steatosis 2015; 2016; 02/2016   +Alcohol has played a role.  CT abd showed fatty liver+ liver enzymes mildly elevated (chron viral hepatitis screening NEG).  Abd u/s 03/26/16 showed fatty liver, small GB polyps, o/w normal.  . History of kidney stones    [facility]  . HTN (hypertension)   . Hx of colonic polyp - ssp 02/11/2014  . Hyperlipidemia    Plan was to recommend statin 04/2017 but unable to make phone contact with Tony [name].  . Insomnia   . Obesity, Class II, BMI 35-39.9   . Psoriasis     Past Surgical History:  Procedure Laterality Date  . COLONOSCOPY W/ BIOPSIES  02/03/14   Cecal polyp (sessile serrated polyp w/out dysplasia) and posterior anal fissure; repeat TCS 2020 per Dr. GeCarlean Purl. LAPAROSCOPIC APPENDECTOMY N/A 02/24/2015   Procedure: APPENDECTOMY LAPAROSCOPIC;  Surgeon: DaAlphonsa OverallMD;  Location: [facility];  Service: General;  Laterality: N/A;  . right ankle     2014    Family History  Problem Relation Age of Onset  . Diabetes Mother   . Restless legs syndrome Mother   . Heart disease Mother   . Heart attack Mother [id number]. Psoriasis  Father   . Stroke Neg Hx   . Cancer Neg Hx      Current Outpatient Medications:  .  atorvastatin (LIPITOR) 20 MG tablet, TAKE 1 TABLET BY MOUTH EVERY DAY (Patient not taking: Reported on 10/20/2017), Disp: 90 tablet, Rfl: 0 .  blood glucose meter kit and supplies, Use to check blood sugar once daily., Disp: 1 each, Rfl: 0 .  Carbamazepine (EQUETRO) 100 MG CP12 12 hr capsule, Take 300 mg by mouth daily. Rx'd by psych , Disp: , Rfl:  .  chlordiazePOXIDE (LIBRIUM) 25 MG capsule, 75m PO TID x 1D, then 25-594mPO BID X 1D, then 25-5073mO QD X 1D, Disp: 10 capsule, Rfl: 0 .  clonazePAM (KLONOPIN) 1 MG  tablet, TAKE 1 TABLET BY MOUTH TWICE A DAY AS NEEDED, Disp: 20 tablet, Rfl: 0 .  Eszopiclone 3 MG TABS, TAKE 1 TABLET BY MOUTH AT BEDTIME AS NEEDED FOR INSOMNIA, Disp: 10 tablet, Rfl: 0 .  glipiZIDE (GLUCOTROL XL) 5 MG 24 hr tablet, Take 1 tablet (5 mg total) by mouth daily with breakfast., Disp: 90 tablet, Rfl: 0 .  lisinopril (PRINIVIL,ZESTRIL) 10 MG tablet, TAKE 1 TABLET BY MOUTH EVERY DAY, Disp: 30 tablet, Rfl: 0 .  meloxicam (MOBIC) 15 MG tablet, Take 15 mg by mouth daily as needed for pain., Disp: , Rfl:  .  promethazine (PHENERGAN) 12.5 MG tablet, 1-2 TABS BY MOUTH EVERY 6 HRS AS NEEDED FOR NAUSEA/VOMITING, Disp: 30 tablet, Rfl: 11 .  propranolol (INDERAL) 10 MG tablet, Take 10 mg by mouth 3 (three) times daily. Rx'd by psych, Disp: , Rfl:   EXAM:  VITALS per patient if applicable:  GENERAL: alert, oriented, appears well and in no acute distress  HEENT: atraumatic, conjunttiva clear, no obvious abnormalities on inspection of external nose and ears  NECK: normal movements of the head and neck  LUNGS: on inspection no signs of respiratory distress, breathing rate appears normal, no obvious gross SOB, gasping or wheezing  CV: no obvious cyanosis  MS: moves all visible extremities without noticeable abnormality  PSYCH/NEURO: pleasant and cooperative, no obvious depression or anxiety, speech and thought processing grossly intact  LABS: none today Lab Results  Component Value Date   TSH 1.47 06/18/2017   Lab Results  Component Value Date   WBC 7.6 02/16/2018   HGB 16.4 02/16/2018   HCT 47.5 02/16/2018   MCV 93.3 02/16/2018   PLT 252 02/16/2018   Lab Results  Component Value Date   CREATININE 0.73 02/16/2018   BUN 11 02/16/2018   NA 137 02/16/2018   K 3.6 02/16/2018   CL 104 02/16/2018   CO2 22 02/16/2018   Lab Results  Component Value Date   ALT 43 02/16/2018   AST 34 02/16/2018   ALKPHOS 79 02/16/2018   BILITOT 0.4 02/16/2018   Lab Results  Component Value  Date   CHOL 230 (H) 04/16/2017   Lab Results  Component Value Date   HDL 33.90 (L) 04/16/2017   Lab Results  Component Value Date   LDLCALC 141 (H) 06/08/2015   Lab Results  Component Value Date   TRIG 343.0 (H) 04/16/2017   Lab Results  Component Value Date   CHOLHDL 7 04/16/2017   Lab Results  Component Value Date   HGBA1C 6.0 10/20/2017    ASSESSMENT AND PLAN:  Discussed the following assessment and plan:  1) GAD: seeing psych NP now, on low dose sertraline. I agreed to continue rx for clonaz 1mg82md,  but he needs to updated controlled substance contract, which he'll review and sign when he comes in soon for blood work.  He'll give urine specimen for UDS at that time as well. He just filled the latest rx I did for clonaz 51m yesterday (1 bid prn, #20) and I told him it is ok to start taking this med now, so it may show up in his uds.  2) DM 2: question of some low glucoses, esp when he doesn't eat much or if he is very active. Discussed risk of this happening when taking glipizide and he states he'll try to adjust eating habits to multiple small meals during the day/evening. HbA1c and fasting glucose ordered--future.  3) HTN: not monitoring bp. Encouraged him to restart this at home. Compliant with med. Lytes/cr ordered future.  4) HLD: he never took the statin I rx'd but said he will now (he was afraid to take it in the context of his drinking and his liver tests, etc).  I'll re-rx atorvastatin 275mqd. FLP ordered-future.  5) Alcohol abuse: self medicates his anxiety with this. He is aware he has a problem and is going to completely abstain from drinking ANY alcohol-->says he has not had any in over 4 months.   6) Fatty liver (hx of elevated LFTs as well): LFT's also elevated at times due to alcohol consumption.  LFTs normal in ED in the midst of an alcohol binge 01/2018. Repeat now--ordered future.  I discussed the assessment and treatment plan with the  patient. The patient was provided an opportunity to ask questions and all were answered. The patient agreed with the plan and demonstrated an understanding of the instructions.   The patient was advised to call back or seek an in-person evaluation if the symptoms worsen or if the condition fails to improve as anticipated.   F/u: Lab visit at his earliest convenience. 3 mo office f/u [facility]  Signed:  PhCrissie SicklesMD           06/09/2018

## 2018-06-14 ENCOUNTER — Other Ambulatory Visit: Payer: Self-pay | Admitting: Family Medicine

## 2018-06-15 ENCOUNTER — Other Ambulatory Visit: Payer: Self-pay

## 2018-06-24 ENCOUNTER — Other Ambulatory Visit (INDEPENDENT_AMBULATORY_CARE_PROVIDER_SITE_OTHER): Payer: Self-pay

## 2018-06-24 ENCOUNTER — Other Ambulatory Visit: Payer: Self-pay

## 2018-06-24 DIAGNOSIS — I10 Essential (primary) hypertension: Secondary | ICD-10-CM

## 2018-06-24 DIAGNOSIS — E7800 Pure hypercholesterolemia, unspecified: Secondary | ICD-10-CM

## 2018-06-24 DIAGNOSIS — E119 Type 2 diabetes mellitus without complications: Secondary | ICD-10-CM

## 2018-06-24 DIAGNOSIS — K7581 Nonalcoholic steatohepatitis (NASH): Secondary | ICD-10-CM

## 2018-06-24 DIAGNOSIS — F411 Generalized anxiety disorder: Secondary | ICD-10-CM

## 2018-06-24 DIAGNOSIS — Z79899 Other long term (current) drug therapy: Secondary | ICD-10-CM

## 2018-06-24 LAB — LIPID PANEL
Cholesterol: 226 mg/dL — ABNORMAL HIGH (ref 0–200)
HDL: 35.5 mg/dL — ABNORMAL LOW (ref >39.00)
NonHDL: 190.18
Total CHOL/HDL Ratio: 6
Triglycerides: 229 mg/dL — ABNORMAL HIGH (ref 0.0–149.0)
VLDL: 45.8 mg/dL — ABNORMAL HIGH (ref 0.0–40.0)

## 2018-06-24 LAB — COMPREHENSIVE METABOLIC PANEL
ALT: 26 U/L (ref 0–53)
AST: 22 U/L (ref 0–37)
Albumin: 4.5 g/dL (ref 3.5–5.2)
Alkaline Phosphatase: 74 U/L (ref 39–117)
BUN: 16 mg/dL (ref 6–23)
CO2: 28 mEq/L (ref 19–32)
Calcium: 8.9 mg/dL (ref 8.4–10.5)
Chloride: 103 mEq/L (ref 96–112)
Creatinine, Ser: 0.95 mg/dL (ref 0.40–1.50)
GFR: 86.99 mL/min (ref >60.00)
Glucose, Bld: 91 mg/dL (ref 70–99)
Potassium: 4 mEq/L (ref 3.5–5.1)
Sodium: 140 mEq/L (ref 135–145)
Total Bilirubin: 0.5 mg/dL (ref 0.2–1.2)
Total Protein: 7 g/dL (ref 6.0–8.3)

## 2018-06-24 LAB — HEMOGLOBIN A1C: Hgb A1c MFr Bld: 5.6 % (ref 4.6–6.5)

## 2018-06-24 LAB — LDL CHOLESTEROL, DIRECT: Direct LDL: 141 mg/dL

## 2018-06-24 NOTE — Addendum Note (Signed)
Addended by: Ralph Dowdy on: 06/24/2018 09:46 AM   Modules accepted: Orders

## 2018-06-26 ENCOUNTER — Encounter: Payer: Self-pay | Admitting: Family Medicine

## 2018-06-26 LAB — PAIN MGMT, PROFILE 8 W/CONF, U
6 Acetylmorphine: NEGATIVE ng/mL
Alcohol Metabolites: NEGATIVE ng/mL (ref <500)
Alphahydroxyalprazolam: NEGATIVE ng/mL
Alphahydroxymidazolam: NEGATIVE ng/mL
Alphahydroxytriazolam: NEGATIVE ng/mL
Aminoclonazepam: 179 ng/mL
Amphetamines: NEGATIVE ng/mL
Benzodiazepines: POSITIVE ng/mL
Buprenorphine, Urine: NEGATIVE ng/mL
Cocaine Metabolite: NEGATIVE ng/mL
Creatinine: 215.2 mg/dL
Hydroxyethylflurazepam: NEGATIVE ng/mL
Lorazepam: NEGATIVE ng/mL
MDMA: NEGATIVE ng/mL
Marijuana Metabolite: NEGATIVE ng/mL
Nordiazepam: NEGATIVE ng/mL
Opiates: NEGATIVE ng/mL
Oxazepam: NEGATIVE ng/mL
Oxidant: NEGATIVE ug/mL
Oxycodone: NEGATIVE ng/mL
Temazepam: NEGATIVE ng/mL
pH: 5.1 (ref 4.5–9.0)

## 2018-06-29 ENCOUNTER — Telehealth: Payer: Self-pay

## 2018-06-29 NOTE — Telephone Encounter (Signed)
My Chart message sent

## 2018-07-13 ENCOUNTER — Other Ambulatory Visit: Payer: Self-pay | Admitting: Family Medicine

## 2018-07-15 NOTE — Telephone Encounter (Signed)
Patient requesting a CB about coming by the office to sign a controlled substance contract. Please call him to advise.

## 2018-07-15 NOTE — Telephone Encounter (Signed)
RF request for Clonazepam LOV: 06/09/18 Next ov: 09/16/18 Last written: 05/25/18 (20,0)  RF request for Eszopiclone LOV: 06/09/18 Next ov: 09/16/18 Last written: 05/25/18(10,0)  Pt had labs on 06/24/18 but CSC left on my desk. Please advise, thanks. Medication pending

## 2018-07-15 NOTE — Telephone Encounter (Signed)
Pt is coming in today to fill out [facility].   Please advise on refills

## 2018-07-17 ENCOUNTER — Telehealth: Payer: Self-pay

## 2018-07-17 ENCOUNTER — Other Ambulatory Visit: Payer: Self-pay | Admitting: Family Medicine

## 2018-07-17 MED ORDER — ESZOPICLONE 3 MG PO TABS: ORAL_TABLET | ORAL | 5 refills | Status: DC

## 2018-07-17 MED ORDER — CLONAZEPAM 1 MG PO TABS: 1.0000 mg | ORAL_TABLET | Freq: Two times a day (BID) | ORAL | 5 refills | Status: DC | PRN

## 2018-07-17 NOTE — Telephone Encounter (Signed)
Pt only received (10,1) for Eszopiclone and (30,1) BID for the Clonazepam. Pt was unde the impression he would get 30 d supply of both medications. CSC was signed this week but has not been scanned into chart yet.  Please advise, thanks.

## 2018-07-17 NOTE — Telephone Encounter (Signed)
That's my fault. Since he has already picked the most recent rx's up.  I'll send in rx's for 1 mo supply with RF's now but I'll put a note on them says not to fill until patient calls to request them.-thx

## 2018-07-21 NOTE — Telephone Encounter (Signed)
MyChart message read.

## 2018-08-15 ENCOUNTER — Other Ambulatory Visit: Payer: Self-pay | Admitting: Family Medicine

## 2018-08-17 ENCOUNTER — Other Ambulatory Visit: Payer: Self-pay | Admitting: Family Medicine

## 2018-09-04 ENCOUNTER — Telehealth: Payer: Self-pay | Admitting: Family Medicine

## 2018-09-04 MED ORDER — PROPRANOLOL HCL 10 MG PO TABS: 10.0000 mg | ORAL_TABLET | Freq: Three times a day (TID) | ORAL | 5 refills | Status: DC

## 2018-09-04 NOTE — Telephone Encounter (Signed)
Patient advised rx was sent to [facility].

## 2018-09-04 NOTE — Telephone Encounter (Signed)
Med is listed as historical med. Never prescribed by you. Last OV 06/09/18 and next OV 09/16/18.  Please advise, thanks.

## 2018-09-04 NOTE — Telephone Encounter (Signed)
Propranolol eRx'd.

## 2018-09-04 NOTE — Telephone Encounter (Signed)
Patient called in requesting propranolol to be sent [facility]

## 2018-09-16 ENCOUNTER — Ambulatory Visit: Payer: Self-pay | Admitting: Family Medicine

## 2018-09-23 ENCOUNTER — Other Ambulatory Visit: Payer: Self-pay

## 2018-09-23 ENCOUNTER — Telehealth: Payer: Self-pay

## 2018-09-23 MED ORDER — LISINOPRIL 10 MG PO TABS: 10.0000 mg | ORAL_TABLET | Freq: Every day | ORAL | 0 refills | Status: DC

## 2018-09-23 NOTE — Telephone Encounter (Signed)
Received a voicemail that pt was having issues with one of his medications, he stated the dosage was 67m but no medication found on his list with that dosage. LM for pt to return call to provide more info.

## 2018-10-06 NOTE — Telephone Encounter (Signed)
Taking the 18m Atorvastatin is making him feel weird and not a good feeling. Visit from 4/14, you were going to re-rx med. He wanted to know if he could cut the pill in half or go down to 154m He has an appt 9/16 for [facility].  Please advise, thanks.

## 2018-10-07 NOTE — Telephone Encounter (Signed)
Patient contacted, advised and voiced understanding.

## 2018-10-07 NOTE — Telephone Encounter (Signed)
MyChart message sent regarding medication instructions.

## 2018-10-07 NOTE — Telephone Encounter (Signed)
OK to split pill in half. If not feeling better on 1/2 tab daily for 7d then just stop the pill completely.-thx

## 2018-10-19 ENCOUNTER — Other Ambulatory Visit: Payer: Self-pay | Admitting: Family Medicine

## 2018-10-19 NOTE — Telephone Encounter (Signed)
Patient over due for follow up, he does have an OV scheduled 11/11/2018.  Will send in 30 day supply x 0 Rfs.   Left detailed message reminding patient to follow up to continue renewing RX.

## 2018-10-22 ENCOUNTER — Ambulatory Visit: Payer: Self-pay | Admitting: Family Medicine

## 2018-11-11 ENCOUNTER — Other Ambulatory Visit: Payer: Self-pay

## 2018-11-11 ENCOUNTER — Encounter: Payer: Self-pay | Admitting: Family Medicine

## 2018-11-11 ENCOUNTER — Ambulatory Visit (INDEPENDENT_AMBULATORY_CARE_PROVIDER_SITE_OTHER): Payer: Self-pay | Admitting: Family Medicine

## 2018-11-11 VITALS — HR 61 | Wt 271.3 lb

## 2018-11-11 DIAGNOSIS — I10 Essential (primary) hypertension: Secondary | ICD-10-CM

## 2018-11-11 DIAGNOSIS — E7800 Pure hypercholesterolemia, unspecified: Secondary | ICD-10-CM

## 2018-11-11 DIAGNOSIS — F411 Generalized anxiety disorder: Secondary | ICD-10-CM

## 2018-11-11 DIAGNOSIS — Z79899 Other long term (current) drug therapy: Secondary | ICD-10-CM

## 2018-11-11 DIAGNOSIS — E119 Type 2 diabetes mellitus without complications: Secondary | ICD-10-CM

## 2018-11-11 MED ORDER — GLIPIZIDE ER 5 MG PO TB24: ORAL_TABLET | ORAL | 0 refills | Status: DC

## 2018-11-11 NOTE — Addendum Note (Signed)
Addended by: Ralph Dowdy on: 11/11/2018 10:21 AM   Modules accepted: Orders

## 2018-11-11 NOTE — Progress Notes (Signed)
Virtual Visit via Video Note  I connected with pt on 11/11/18 at  9:30 AM EDT by a video enabled telemedicine application and verified that I am speaking with the correct person using two identifiers.  Location patient: home Location provider:work or home office Persons participating in the virtual visit: patient, provider  I discussed the limitations of evaluation and management by telemedicine and the availability of in person appointments. The patient expressed understanding and agreed to proceed.  Telemedicine visit is a necessity given the COVID-19 restrictions in place at the current time.  HPI: 43 y/o WM being seen today for 5 mo f/u DM 2, HTN, HLD, chronic anxiety. Hx of alcohol abuse-->mainly as self treatment for his anxiety.  Anxiety: seeing psychiatrist who rx's him sertraline and propranolol. I rx him clonaz 80m and eszopiclone 342m  PMP aware reviewed today: most recent rx fill of clonaz was 10/09/18, has 2 RF's remaining.  Most recent rx of eszopiclone filled was 10/09/18, 1 RF remaining.  No suspicious activity. Sleep is much improved for the last 6 months. Anxiety level has been better than it has in a long time.  He is not drinking any alcohol.  HTN: no home bp monitoring.  Compliant with med.  DM: takes glipizide regularly and does not check home glucoses.   Occ at his mom's he'll check his glucose and it was 96.    No polyuria or polydipsia.  HLD: lipitor 2073md started last visit made him feel awful--he finds it hard to explain the way it made him feel.  He stopped the med after taking it one day. He is going to cut dose in half and try it. He is very sensitive to medications.   ROS: no CP, no SOB, no wheezing, no cough, no dizziness, no HAs, no rashes, no melena/hematochezia.  No polyuria or polydipsia.  No myalgias or arthralgias.   Past Medical History:  Diagnosis Date  . Abnormal weight gain 05/25/2015  . Alcohol abuse 2016/2017   Pt states he was self  medicating his anxiety.  Quit 04/2015.  Minimal intake as of 05/2017 f/u.  . AMarland Kitchenal fissure   . Anxiety   . Diabetes mellitus ([facility]/2017   New dx 12/2015  . Hepatic steatosis 2015; 2016; 02/2016   +Alcohol has played a role.  CT abd showed fatty liver+ liver enzymes mildly elevated (chron viral hepatitis screening NEG).  Abd u/s 03/26/16 showed fatty liver, small GB polyps, o/w normal.  . History of kidney stones    [facility]  . HTN (hypertension)   . Hx of colonic polyp - ssp 02/11/2014  . Hyperlipidemia    Statin started 05/2018  . Insomnia   . Obesity, Class II, BMI 35-39.9   . Psoriasis     Past Surgical History:  Procedure Laterality Date  . COLONOSCOPY W/ BIOPSIES  02/03/14   Cecal polyp (sessile serrated polyp w/out dysplasia) and posterior anal fissure; repeat TCS 2020 per Dr. GesCarlean Purl LAPAROSCOPIC APPENDECTOMY N/A 02/24/2015   Procedure: APPENDECTOMY LAPAROSCOPIC;  Surgeon: DavAlphonsa OverallD;  Location: [facility];  Service: General;  Laterality: N/A;  . right ankle     2014    Family History  Problem Relation Age of Onset  . Diabetes Mother   . Restless legs syndrome Mother   . Heart disease Mother   . Heart attack Mother 53 25 Psoriasis Father   . Stroke Neg Hx   . Cancer Neg Hx  SOCIAL HX:  Social History   Socioeconomic History  . Marital status: Married    Spouse name: Not on file  . Number of children: Not on file  . Years of education: Not on file  . Highest education level: Not on file  Occupational History  . Occupation: Secondary school teacher  Social Needs  . Financial resource strain: Not on file  . Food insecurity    Worry: Not on file    Inability: Not on file  . Transportation needs    Medical: Not on file    Non-medical: Not on file  Tobacco Use  . Smoking status: Never Smoker  . Smokeless tobacco: Never Used  Substance and Sexual Activity  . Alcohol use: Yes    Alcohol/week: 0.0 standard drinks    Comment: 1/5 liquor a day  . Drug  use: No  . Sexual activity: Not on file  Lifestyle  . Physical activity    Days per week: Not on file    Minutes per session: Not on file  . Stress: Not on file  Relationships  . Social Herbalist on phone: Not on file    Gets together: Not on file    Attends religious service: Not on file    Active member of club or organization: Not on file    Attends meetings of clubs or organizations: Not on file    Relationship status: Not on file  Other Topics Concern  . Not on file  Social History Narrative   Married (spring 2019), no children.   Orig from [place].   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   No tobacco.   Alcohol: hx of "heavy drinking".  Cut back as of 04/2015.     Current Outpatient Medications:  .  carbamazepine (TEGRETOL) 100 MG chewable tablet, TAKE 1 TABLET BY MOUTH IN THE MORNING THEN TAKE 2 TABLETS BY MOUTH AT BEDTIME, Disp: , Rfl:  .  clonazePAM (KLONOPIN) 1 MG tablet, Take 1 tablet (1 mg total) by mouth 2 (two) times daily as needed., Disp: 60 tablet, Rfl: 5 .  Eszopiclone 3 MG TABS, TAKE 1 TABLET BY MOUTH EVERY DAY AT BEDTIME AS NEEDED FOR INSOMNIA, Disp: 30 tablet, Rfl: 5 .  glipiZIDE (GLUCOTROL XL) 5 MG 24 hr tablet, TAKE 1 TABLET BY MOUTH EVERY DAY WITH BREAKFAST, Disp: 90 tablet, Rfl: 0 .  lisinopril (ZESTRIL) 10 MG tablet, TAKE 1 TABLET BY MOUTH EVERY DAY, Disp: 30 tablet, Rfl: 0 .  meloxicam (MOBIC) 15 MG tablet, Take 15 mg by mouth daily as needed for pain., Disp: , Rfl:  .  promethazine (PHENERGAN) 12.5 MG tablet, 1-2 TABS BY MOUTH EVERY 6 HRS AS NEEDED FOR NAUSEA/VOMITING, Disp: 30 tablet, Rfl: 11 .  propranolol (INDERAL) 10 MG tablet, Take 1 tablet (10 mg total) by mouth 3 (three) times daily. Rx'd by psych, Disp: 90 tablet, Rfl: 5 .  sertraline (ZOLOFT) 25 MG tablet, Take 12.5 mg by mouth daily., Disp: , Rfl:   EXAM:  VITALS per patient if applicable: Pulse 61   Wt 271 lb 4.8 oz (123.1 kg)   BMI 38.93 kg/m    GENERAL: alert, oriented, appears well  and in no acute distress  HEENT: atraumatic, conjunttiva clear, no obvious abnormalities on inspection of external nose and ears  NECK: normal movements of the head and neck  LUNGS: on inspection no signs of respiratory distress, breathing rate appears normal, no obvious gross SOB, gasping or wheezing  CV: no obvious cyanosis  MS: moves all visible extremities without noticeable abnormality  PSYCH/NEURO: pleasant and cooperative, no obvious depression or anxiety, speech and thought processing grossly intact  LABS: none today  Lab Results  Component Value Date   TSH 1.47 06/18/2017   Lab Results  Component Value Date   WBC 7.6 02/16/2018   HGB 16.4 02/16/2018   HCT 47.5 02/16/2018   MCV 93.3 02/16/2018   PLT 252 02/16/2018   Lab Results  Component Value Date   CREATININE 0.95 06/24/2018   BUN 16 06/24/2018   NA 140 06/24/2018   K 4.0 06/24/2018   CL 103 06/24/2018   CO2 28 06/24/2018   Lab Results  Component Value Date   ALT 26 06/24/2018   AST 22 06/24/2018   ALKPHOS 74 06/24/2018   BILITOT 0.5 06/24/2018   Lab Results  Component Value Date   CHOL 226 (H) 06/24/2018   Lab Results  Component Value Date   HDL 35.50 (L) 06/24/2018   Lab Results  Component Value Date   LDLCALC 141 (H) 06/08/2015   Lab Results  Component Value Date   TRIG 229.0 (H) 06/24/2018   Lab Results  Component Value Date   CHOLHDL 6 06/24/2018   Lab Results  Component Value Date   HGBA1C 5.6 06/24/2018    ASSESSMENT AND PLAN:  Discussed the following assessment and plan:  1) DM: no home gluc monitoring. Last A1c excellent. If A1c this time is <6.2% then we'll see how he does off of glipizide. Urine microalb/cr -future.  2) HTN: The current medical regimen is effective;  continue present plan and medications. Needs to periodically monitor bp at his mom's house and send numbers to me via mychart. Lytes/cr -future.  3) HLD: intol of atorva 81m qd. He'll try 160mqd  and let usKoreanow how he does.  4) GAD and anxiety-related insomnia: doing well. PMP aware reviewed: no red flags. No new rx's for clonaz or eszopiclone today. CSC and UDS UTD.  -we discussed possible serious and likely etiologies, options for evaluation and workup, limitations of telemedicine visit vs in person visit, treatment, treatment risks and precautions. Pt prefers to treat via telemedicine empirically rather then risking or undertaking an in person visit at this moment. Patient agrees to seek prompt in person care if worsening, new symptoms arise, or if is not improving with treatment.   I discussed the assessment and treatment plan with the patient. The patient was provided an opportunity to ask questions and all were answered. The patient agreed with the plan and demonstrated an understanding of the instructions.   The patient was advised to call back or seek an in-person evaluation if the symptoms worsen or if the condition fails to improve as anticipated.  F/u: 6 mo  Signed:  PhCrissie SicklesMD           11/11/2018

## 2018-11-13 ENCOUNTER — Other Ambulatory Visit: Payer: Self-pay

## 2018-11-13 ENCOUNTER — Ambulatory Visit (INDEPENDENT_AMBULATORY_CARE_PROVIDER_SITE_OTHER): Payer: Self-pay | Admitting: Family Medicine

## 2018-11-13 DIAGNOSIS — I10 Essential (primary) hypertension: Secondary | ICD-10-CM

## 2018-11-13 DIAGNOSIS — E119 Type 2 diabetes mellitus without complications: Secondary | ICD-10-CM

## 2018-11-13 DIAGNOSIS — E7800 Pure hypercholesterolemia, unspecified: Secondary | ICD-10-CM

## 2018-11-13 LAB — LIPID PANEL
Cholesterol: 238 mg/dL — ABNORMAL HIGH (ref 0–200)
HDL: 37.6 mg/dL — ABNORMAL LOW (ref >39.00)
NonHDL: 200.03
Total CHOL/HDL Ratio: 6
Triglycerides: 303 mg/dL — ABNORMAL HIGH (ref 0.0–149.0)
VLDL: 60.6 mg/dL — ABNORMAL HIGH (ref 0.0–40.0)

## 2018-11-13 LAB — COMPREHENSIVE METABOLIC PANEL
ALT: 37 U/L (ref 0–53)
AST: 26 U/L (ref 0–37)
Albumin: 4.5 g/dL (ref 3.5–5.2)
Alkaline Phosphatase: 90 U/L (ref 39–117)
BUN: 14 mg/dL (ref 6–23)
CO2: 29 mEq/L (ref 19–32)
Calcium: 9 mg/dL (ref 8.4–10.5)
Chloride: 102 mEq/L (ref 96–112)
Creatinine, Ser: 0.86 mg/dL (ref 0.40–1.50)
GFR: 97.39 mL/min (ref >60.00)
Glucose, Bld: 98 mg/dL (ref 70–99)
Potassium: 4.2 mEq/L (ref 3.5–5.1)
Sodium: 138 mEq/L (ref 135–145)
Total Bilirubin: 0.3 mg/dL (ref 0.2–1.2)
Total Protein: 7 g/dL (ref 6.0–8.3)

## 2018-11-13 LAB — LDL CHOLESTEROL, DIRECT: Direct LDL: 155 mg/dL

## 2018-11-13 LAB — HEMOGLOBIN A1C: Hgb A1c MFr Bld: 5.8 % (ref 4.6–6.5)

## 2018-11-17 ENCOUNTER — Other Ambulatory Visit: Payer: Self-pay | Admitting: Family Medicine

## 2019-02-04 ENCOUNTER — Other Ambulatory Visit: Payer: Self-pay

## 2019-02-04 DIAGNOSIS — Z20822 Contact with and (suspected) exposure to covid-19: Secondary | ICD-10-CM

## 2019-02-06 ENCOUNTER — Telehealth: Payer: Self-pay | Admitting: Nurse Practitioner

## 2019-02-06 DIAGNOSIS — J111 Influenza due to unidentified influenza virus with other respiratory manifestations: Secondary | ICD-10-CM

## 2019-02-06 LAB — NOVEL CORONAVIRUS, NAA: SARS-CoV-2, NAA: NOT DETECTED

## 2019-02-06 MED ORDER — OSELTAMIVIR PHOSPHATE 75 MG PO CAPS: 75.0000 mg | ORAL_CAPSULE | Freq: Two times a day (BID) | ORAL | 0 refills | Status: DC

## 2019-02-06 NOTE — Progress Notes (Signed)
E visit for Flu like symptoms   We are sorry that you are not feeling well.  Here is how we plan to help! Based on what you have shared with me it looks like you may have a respiratory virus that may be influenza.  Influenza or "the flu" is   an infection caused by a respiratory virus. The flu virus is highly contagious and persons who did not receive their yearly flu vaccination may "catch" the flu from close contact.  We have anti-viral medications to treat the viruses that cause this infection. They are not a "cure" and only shorten the course of the infection. These prescriptions are most effective when they are given within the first 2 days of "flu" symptoms. Antiviral medication are indicated if you have a high risk of complications from the flu. You should  also consider an antiviral medication if you are in close contact with someone who is at risk. These medications can help patients avoid complications from the flu  but have side effects that you should know. Possible side effects from Tamiflu or oseltamivir include nausea, vomiting, diarrhea, dizziness, headaches, eye redness, sleep problems or other respiratory symptoms. You should not take Tamiflu if you have an allergy to oseltamivir or any to the ingredients in Tamiflu.  Based upon your symptoms and potential risk factors I have prescribed Oseltamivir (Tamiflu).  It has been sent to your designated pharmacy.  You will take one 75 mg capsule orally twice a day for the next 5 days.  ANYONE WHO HAS FLU SYMPTOMS SHOULD: . Stay home. The flu is highly contagious and going out or to work exposes others! . Be sure to drink plenty of fluids. Water is fine as well as fruit juices, sodas and electrolyte beverages. You may want to stay away from caffeine or alcohol. If you are nauseated, try taking small sips of liquids. How do you know if you are getting enough fluid? Your urine should be a pale yellow or almost colorless. . Get rest. . Taking a  steamy shower or using a humidifier may help nasal congestion and ease sore throat pain. Using a saline nasal spray works much the same way. . Cough drops, hard candies and sore throat lozenges may ease your cough. . Line up a caregiver. Have someone check on you regularly.   GET HELP RIGHT AWAY IF: . You cannot keep down liquids or your medications. . You become short of breath . Your fell like you are going to pass out or loose consciousness. . Your symptoms persist after you have completed your treatment plan MAKE SURE YOU   Understand these instructions.  Will watch your condition.  Will get help right away if you are not doing well or get worse.  Your e-visit answers were reviewed by a board certified advanced clinical practitioner to complete your personal care plan.  Depending on the condition, your plan could have included both over the counter or prescription medications.  If there is a problem please reply  once you have received a response from your provider.  Your safety is important to [place].  If you have drug allergies check your prescription carefully.    You can use MyChart to ask questions about today's visit, request a non-urgent call back, or ask for a work or school excuse for 24 hours related to this e-Visit. If it has been greater than 24 hours you will need to follow up with your provider, or enter a new e-Visit  to address those concerns.  You will get an e-mail in the next two days asking about your experience.  I hope that your e-visit has been valuable and will speed your recovery. Thank you for using e-visits.  5-10 minutes spent reviewing and documenting in chart.

## 2019-02-07 ENCOUNTER — Telehealth: Payer: Self-pay

## 2019-02-20 ENCOUNTER — Other Ambulatory Visit: Payer: Self-pay | Admitting: Family Medicine

## 2019-02-22 NOTE — Telephone Encounter (Signed)
Requesting: Clonazepam Contract:06/09/18 UDS:06/24/18 Last Visit:11/11/18 Next Visit:05/06/19 Last Refill:07/17/18 (60,5)  Requesting: Eszopiclone Contract:n/a UDS:06/24/18 Last Visit:11/11/18 Next Visit:05/06/19 Last Refill:07/17/18 (30,5)  Please Advise. Medications pending

## 2019-03-02 ENCOUNTER — Encounter: Payer: Self-pay | Admitting: Internal Medicine

## 2019-03-15 ENCOUNTER — Encounter: Payer: Self-pay | Admitting: Internal Medicine

## 2019-03-16 ENCOUNTER — Telehealth: Payer: Self-pay | Admitting: Internal Medicine

## 2019-03-16 NOTE — Telephone Encounter (Signed)
LM on vmail to call back to schedule his recall colonoscopy per Dr. Celesta Aver recall assessment

## 2019-05-06 ENCOUNTER — Ambulatory Visit: Payer: Self-pay | Admitting: Family Medicine

## 2019-05-18 ENCOUNTER — Other Ambulatory Visit: Payer: Self-pay | Admitting: Family Medicine

## 2019-05-20 ENCOUNTER — Ambulatory Visit: Payer: Self-pay | Attending: Internal Medicine

## 2019-05-20 DIAGNOSIS — Z23 Encounter for immunization: Secondary | ICD-10-CM

## 2019-05-20 NOTE — Progress Notes (Signed)
   Covid-19 Vaccination Clinic  Name:  Tony [name]    MRN: [id number] [name]: Jan 18, 1976  05/20/2019  Tony [name] was observed post Covid-19 immunization for 15 minutes without incident. He was provided with Vaccine Information Sheet and instruction to access the V-Safe system.   Tony [name] was instructed to call 911 with any severe reactions post vaccine: Marland Kitchen Difficulty breathing  . Swelling of face and throat  . A fast heartbeat  . A bad rash all over body  . Dizziness and weakness   Immunizations Administered    Name Date Dose VIS Date Route   Pfizer COVID-19 Vaccine 05/20/2019 12:22 PM 0.3 mL 02/05/2019 Intramuscular   Manufacturer: [place]   Lot: [id number]   [place]: [id number]

## 2019-05-31 ENCOUNTER — Other Ambulatory Visit: Payer: Self-pay

## 2019-05-31 MED ORDER — ESZOPICLONE 3 MG PO TABS: ORAL_TABLET | ORAL | 0 refills | Status: DC

## 2019-05-31 NOTE — Telephone Encounter (Signed)
Requesting:Eszopiclone Contract:n/a for this med UDS:06/24/18 Last Visit:11/11/18 Next Visit:06/07/19, calling sometime today to r/s Last Refill:02/22/19(90,1) would like to change pharmacy, updated already  Please Advise. Medication pending   Patient Name: Tony [name] Gender: Male [name]: 01-Apr-1975 Age: 44 Y 9 M 26 D Return Phone Number: [phone] (Primary) Address: City/State/Zip: [place] [address] Client [facility] - Client Client Site [place] - Day Physician Crissie Sickles - MD Contact Type Call Who Is Calling Patient / Member / Family / Caregiver Call Type Triage / Clinical Relationship To Patient Self Return Phone Number [phone] (Primary) Chief Complaint Prescription Refill or Medication Request (non symptomatic) Reason for Call Medication Question / Request Initial Comment Caller needs prescription resent to another pharmacy. Caller needs to reschedule his appt with the office also. Translation No Nurse Assessment Nurse: Reasor, RN, Leah Date/Time (Eastern Time): 05/28/2019 5:33:11 PM Confirm and document reason for call. If symptomatic, describe symptoms. ---Caller needs rx for eszopiclone 3 mg PO qhs resent to another pharmacy because of price. Pt also needs to reschedule his appt with the office also. Original pharmacy is [facility] on [address], but pt is requesting [name] on [address] @ [phone]. Pt states he has enough to get through the weekend, no current s/sx at this time. Pt informed that controlled medications are not handled after hours per policy, instructed to call office Monday AM for rx to be re-submitted to another pharmacy and to re-schedule appt. Has the patient had close contact with a person known or suspected to have the novel coronavirus illness OR traveled / lives in area with major community spread (including international travel) in the last 14 days from the onset of symptoms?  * If Asymptomatic, screen for exposure and travel within the last 14 days. ---No Does the patient have any new or worsening symptoms? ---No Guidelines Guideline Title Affirmed Question Affirmed Notes Nurse Date/Time (Eastern Time) Disp. Time Eilene Ghazi Time) Disposition Final User 05/28/2019 4:12:02 PM Send to [place], RN, Daphene Jaeger 05/28/2019 5:00:09 PM Attempt made - message left Reasor, RN, Denny Peon 05/28/2019 5:40:43 PM Clinical Call Yes Reasor, RN, Denny Peon

## 2019-05-31 NOTE — Telephone Encounter (Signed)
RF sent.

## 2019-06-07 ENCOUNTER — Ambulatory Visit: Payer: Self-pay | Admitting: Family Medicine

## 2019-06-10 ENCOUNTER — Other Ambulatory Visit: Payer: Self-pay | Admitting: Family Medicine

## 2019-06-21 ENCOUNTER — Ambulatory Visit: Payer: Self-pay | Attending: Internal Medicine

## 2019-06-21 DIAGNOSIS — Z23 Encounter for immunization: Secondary | ICD-10-CM

## 2019-06-21 NOTE — Progress Notes (Signed)
   Covid-19 Vaccination Clinic  Name:  Tony [name]    MRN: [id number] [name]: 1975-04-02  06/21/2019  [name] was observed post Covid-19 immunization for 15 minutes without incident. He was provided with Vaccine Information Sheet and instruction to access the V-Safe system.   [name] was instructed to call 911 with any severe reactions post vaccine: Marland Kitchen Difficulty breathing  . Swelling of face and throat  . A fast heartbeat  . A bad rash all over body  . Dizziness and weakness   Immunizations Administered    Name Date Dose VIS Date Route   Pfizer COVID-19 Vaccine 06/21/2019 10:26 AM 0.3 mL 04/21/2018 Intramuscular   Manufacturer: [place]   Lot: [id number]   [place]: [address]-3

## 2019-06-25 ENCOUNTER — Other Ambulatory Visit: Payer: Self-pay

## 2019-07-01 ENCOUNTER — Ambulatory Visit (INDEPENDENT_AMBULATORY_CARE_PROVIDER_SITE_OTHER): Payer: Self-pay | Admitting: Family Medicine

## 2019-07-01 ENCOUNTER — Other Ambulatory Visit: Payer: Self-pay

## 2019-07-01 ENCOUNTER — Encounter: Payer: Self-pay | Admitting: Family Medicine

## 2019-07-01 VITALS — BP 119/76 | HR 85 | Temp 98.3°F | Resp 16 | Ht 69.5 in | Wt 289.0 lb

## 2019-07-01 DIAGNOSIS — I10 Essential (primary) hypertension: Secondary | ICD-10-CM

## 2019-07-01 DIAGNOSIS — F411 Generalized anxiety disorder: Secondary | ICD-10-CM

## 2019-07-01 DIAGNOSIS — E291 Testicular hypofunction: Secondary | ICD-10-CM

## 2019-07-01 DIAGNOSIS — E119 Type 2 diabetes mellitus without complications: Secondary | ICD-10-CM

## 2019-07-01 DIAGNOSIS — R6882 Decreased libido: Secondary | ICD-10-CM

## 2019-07-01 DIAGNOSIS — K760 Fatty (change of) liver, not elsewhere classified: Secondary | ICD-10-CM

## 2019-07-01 DIAGNOSIS — E7800 Pure hypercholesterolemia, unspecified: Secondary | ICD-10-CM

## 2019-07-01 DIAGNOSIS — N529 Male erectile dysfunction, unspecified: Secondary | ICD-10-CM

## 2019-07-01 MED ORDER — ROSUVASTATIN CALCIUM 5 MG PO TABS: 5.0000 mg | ORAL_TABLET | Freq: Every day | ORAL | 2 refills | Status: DC

## 2019-07-01 NOTE — Progress Notes (Signed)
OFFICE VISIT  07/01/2019   CC:  Chief Complaint  Patient presents with  . Diabetes    Here for follow up  . Fatigue    Complains of feeling tired for "about 1 month last month, better now"  . Anxiety    HPI:    Patient is a 44 y.o. Caucasian male who presents for f/u HTN, HLD, DM 2, and anxiety. I last saw him about 8 mo ago --virtual.  DM:  A1c 5.8% last visit, d/c'd glipizide at that time. "I don't eat bad" but admits doesn't eat right when on the road. Not drinking or smoking. He doesn't exercise other than some walking. He laments about wt gain.  Pretty tired a lot but feels like this has been less of a problem the last couple months.  HTN: no home bp monitoring.  Compliant with med.   HLD: intol of atorva.  Tried cutting dose in half and still felt bad.  Reports today that his testosterone has been very low in the remote past (Dr. Cipriano Bunker in [place]). Was given option of testost replacement but he chose not to. Has dec libido and ED.  ROS: no fevers, no CP, no SOB, no wheezing, no cough, no dizziness, no HAs, no rashes, no melena/hematochezia.  No polyuria or polydipsia.  No myalgias or arthralgias.  No focal weakness, paresthesias, or tremors.  No acute vision or hearing abnormalities. No n/v/d or abd pain.  No palpitations.     Anxiety: Has chronic anxiety and insomnia for which I rx clonaz 43m bid and eszopiclone qhs. Sleep is fine.  Takes eszopiclone about 50% of nights. He sees psychiatrist and is rx'd tegretol, sertraline,  and propranolol by that provider. PMP AWARE reviewed today: most recent rx for eszopiclon 358mwas filled 05/31/19, # 30, rx by me. Clonaz last filled 05/28/19, #180, rx by me. No red flags.  .  Past Medical History:  Diagnosis Date  . Abnormal weight gain 05/25/2015  . Acute appendicitis 02/24/2015  . Alcohol abuse 2016/2017   Pt states he was self medicating his anxiety.  Quit 04/2015.  Minimal intake as of 05/2017 f/u.  . Marland Kitchennal fissure   .  Anxiety   . Diabetes mellitus ([place]11/2017   New dx 12/2015  . Hepatic steatosis 2015; 2016; 02/2016   +Alcohol has played a role.  CT abd showed fatty liver+ liver enzymes mildly elevated (chron viral hepatitis screening NEG).  Abd u/s 03/26/16 showed fatty liver, small GB polyps, o/w normal.  . History of kidney stones    [facility]  . HTN (hypertension)   . Hx of colonic polyp - ssp 02/11/2014  . Hyperlipidemia    Statin started 05/2018  . Insomnia   . Obesity, Class II, BMI 35-39.9   . Psoriasis     Past Surgical History:  Procedure Laterality Date  . COLONOSCOPY W/ BIOPSIES  02/03/14   Cecal polyp (sessile serrated polyp w/out dysplasia) and posterior anal fissure; repeat TCS 2020 per Dr. GeCarlean Purl. LAPAROSCOPIC APPENDECTOMY N/A 02/24/2015   Procedure: APPENDECTOMY LAPAROSCOPIC;  Surgeon: DaAlphonsa OverallMD;  Location: [facility];  Service: General;  Laterality: N/A;  . right ankle     2014    Outpatient Medications Prior to Visit  Medication Sig Dispense Refill  . carbamazepine (TEGRETOL) 100 MG chewable tablet TAKE 1 TABLET BY MOUTH IN THE MORNING THEN TAKE 2 TABLETS BY MOUTH AT BEDTIME    . clonazePAM (KLONOPIN) 1 MG tablet TAKE 1 TABLET (  1 MG TOTAL) BY MOUTH 2 (TWO) TIMES DAILY AS NEEDED. 180 tablet 1  . Eszopiclone 3 MG TABS TAKE 1 TABLET BY MOUTH AT BEDTIME AS NEEDED FOR INSOMNIA 30 tablet 0  . lisinopril (ZESTRIL) 10 MG tablet TAKE 1 TABLET BY MOUTH EVERY DAY 30 tablet 0  . meloxicam (MOBIC) 15 MG tablet Take 15 mg by mouth daily as needed for pain.    . promethazine (PHENERGAN) 12.5 MG tablet 1-2 TABS BY MOUTH EVERY 6 HRS AS NEEDED FOR NAUSEA/VOMITING 30 tablet 11  . propranolol (INDERAL) 10 MG tablet Take 1 tablet (10 mg total) by mouth 3 (three) times daily. Rx'd by psych 90 tablet 5  . sertraline (ZOLOFT) 25 MG tablet Take 12.5 mg by mouth daily.    Marland Kitchen oseltamivir (TAMIFLU) 75 MG capsule Take 1 capsule (75 mg total) by mouth 2 (two) times daily. 10 capsule 0   No  facility-administered medications prior to visit.    No Known Allergies  ROS As per HPI  PE: Blood pressure 119/76, pulse 85, temperature 98.3 F (36.8 C), temperature source Temporal, resp. rate 16, height 5' 9.5" (1.765 m), weight 289 lb (131.1 kg), SpO2 100 %. Gen: Alert, well appearing.  Patient is oriented to person, place, time, and situation. AFFECT: pleasant, lucid thought and speech. CV: RRR, no m/r/g.   LUNGS: CTA bilat, nonlabored resps, good aeration in all lung fields. EXT: no clubbing or cyanosis.  no edema.    LABS:  Lab Results  Component Value Date   TSH 1.47 06/18/2017   Lab Results  Component Value Date   WBC 7.6 02/16/2018   HGB 16.4 02/16/2018   HCT 47.5 02/16/2018   MCV 93.3 02/16/2018   PLT 252 02/16/2018   Lab Results  Component Value Date   CREATININE 0.86 11/13/2018   BUN 14 11/13/2018   NA 138 11/13/2018   K 4.2 11/13/2018   CL 102 11/13/2018   CO2 29 11/13/2018   Lab Results  Component Value Date   ALT 37 11/13/2018   AST 26 11/13/2018   ALKPHOS 90 11/13/2018   BILITOT 0.3 11/13/2018   Lab Results  Component Value Date   CHOL 238 (H) 11/13/2018   Lab Results  Component Value Date   HDL 37.60 (L) 11/13/2018   Lab Results  Component Value Date   LDLCALC 141 (H) 06/08/2015   Lab Results  Component Value Date   TRIG 303.0 (H) 11/13/2018   Lab Results  Component Value Date   CHOLHDL 6 11/13/2018   Lab Results  Component Value Date   HGBA1C 5.8 11/13/2018   IMPRESSION AND PLAN:  1) HTN: The current medical regimen is effective;  continue present plan and medications. Lytes/cr today.  2) HLD: did not tolerate atorva.  Will try rosuvastatin 88m daily. FLP today, hepatic panel today.  3) DM: diet only. HbA1c and urine microalb/cr today. Has room for improvement in diet and exercise.  4) GAD: stable. CSC updated today.  5) Hx of hypotestosteronism (remote): ongoing fatigue, dec libido, and some ED. Check testost,  cortisol, LH, FSH, estrogen, TSH.  An After Visit Summary was printed and given to the patient.  FOLLOW UP: Return in about 2 months (around 08/31/2019) for f/u chol/statin.  Signed:  PCrissie Sickles MD           07/01/2019

## 2019-07-02 ENCOUNTER — Ambulatory Visit (INDEPENDENT_AMBULATORY_CARE_PROVIDER_SITE_OTHER): Payer: Self-pay | Admitting: Family Medicine

## 2019-07-02 DIAGNOSIS — E291 Testicular hypofunction: Secondary | ICD-10-CM

## 2019-07-02 DIAGNOSIS — R6882 Decreased libido: Secondary | ICD-10-CM

## 2019-07-02 DIAGNOSIS — E119 Type 2 diabetes mellitus without complications: Secondary | ICD-10-CM

## 2019-07-02 DIAGNOSIS — I10 Essential (primary) hypertension: Secondary | ICD-10-CM

## 2019-07-02 DIAGNOSIS — E7800 Pure hypercholesterolemia, unspecified: Secondary | ICD-10-CM

## 2019-07-02 DIAGNOSIS — N529 Male erectile dysfunction, unspecified: Secondary | ICD-10-CM

## 2019-07-02 LAB — COMPREHENSIVE METABOLIC PANEL
ALT: 55 U/L — ABNORMAL HIGH (ref 0–53)
AST: 39 U/L — ABNORMAL HIGH (ref 0–37)
Albumin: 4.5 g/dL (ref 3.5–5.2)
Alkaline Phosphatase: 94 U/L (ref 39–117)
BUN: 14 mg/dL (ref 6–23)
CO2: 28 mEq/L (ref 19–32)
Calcium: 8.9 mg/dL (ref 8.4–10.5)
Chloride: 101 mEq/L (ref 96–112)
Creatinine, Ser: 0.91 mg/dL (ref 0.40–1.50)
GFR: 90.97 mL/min (ref >60.00)
Glucose, Bld: 103 mg/dL — ABNORMAL HIGH (ref 70–99)
Potassium: 4.1 mEq/L (ref 3.5–5.1)
Sodium: 136 mEq/L (ref 135–145)
Total Bilirubin: 0.3 mg/dL (ref 0.2–1.2)
Total Protein: 7.2 g/dL (ref 6.0–8.3)

## 2019-07-02 LAB — CBC WITH DIFFERENTIAL/PLATELET
Basophils Absolute: 0.1 10*3/uL (ref 0.0–0.1)
Basophils Relative: 2.2 % (ref 0.0–3.0)
Eosinophils Absolute: 0.3 10*3/uL (ref 0.0–0.7)
Eosinophils Relative: 6 % — ABNORMAL HIGH (ref 0.0–5.0)
HCT: 43.6 % (ref 39.0–52.0)
Hemoglobin: 14.8 g/dL (ref 13.0–17.0)
Lymphocytes Relative: 40.4 % (ref 12.0–46.0)
Lymphs Abs: 2.2 10*3/uL (ref 0.7–4.0)
MCHC: 34 g/dL (ref 30.0–36.0)
MCV: 97.2 fl (ref 78.0–100.0)
Monocytes Absolute: 0.6 10*3/uL (ref 0.1–1.0)
Monocytes Relative: 10.4 % (ref 3.0–12.0)
Neutro Abs: 2.2 10*3/uL (ref 1.4–7.7)
Neutrophils Relative %: 41 % — ABNORMAL LOW (ref 43.0–77.0)
Platelets: 167 10*3/uL (ref 150.0–400.0)
RBC: 4.48 Mil/uL (ref 4.22–5.81)
RDW: 13 % (ref 11.5–15.5)
WBC: 5.3 10*3/uL (ref 4.0–10.5)

## 2019-07-02 LAB — CORTISOL: Cortisol, Plasma: 7.4 ug/dL

## 2019-07-02 LAB — MICROALBUMIN / CREATININE URINE RATIO
Creatinine,U: 69.8 mg/dL
Microalb Creat Ratio: 1 mg/g (ref 0.0–30.0)
Microalb, Ur: <0.7 mg/dL (ref 0.0–1.9)

## 2019-07-02 LAB — FOLLICLE STIMULATING HORMONE: FSH: 7.4 m[IU]/mL (ref 1.4–18.1)

## 2019-07-02 LAB — LIPID PANEL
Cholesterol: 259 mg/dL — ABNORMAL HIGH (ref 0–200)
HDL: 36.3 mg/dL — ABNORMAL LOW (ref >39.00)
Total CHOL/HDL Ratio: 7
Triglycerides: 468 mg/dL — ABNORMAL HIGH (ref 0.0–149.0)

## 2019-07-02 LAB — HEMOGLOBIN A1C: Hgb A1c MFr Bld: 6.2 % (ref 4.6–6.5)

## 2019-07-02 LAB — TSH: TSH: 3.42 u[IU]/mL (ref 0.35–4.50)

## 2019-07-02 LAB — LDL CHOLESTEROL, DIRECT: Direct LDL: 157 mg/dL

## 2019-07-02 LAB — LUTEINIZING HORMONE: LH: 6.13 m[IU]/mL (ref 1.50–9.30)

## 2019-07-04 ENCOUNTER — Other Ambulatory Visit: Payer: Self-pay | Admitting: Family Medicine

## 2019-07-05 ENCOUNTER — Other Ambulatory Visit: Payer: Self-pay | Admitting: Family Medicine

## 2019-07-05 ENCOUNTER — Encounter: Payer: Self-pay | Admitting: Family Medicine

## 2019-07-05 MED ORDER — CLOMIPHENE CITRATE 50 MG PO TABS: 50.0000 mg | ORAL_TABLET | Freq: Every day | ORAL | 1 refills | Status: DC

## 2019-07-09 LAB — TESTOSTERONE TOTAL,FREE,BIO, MALES
Albumin: 4.4 g/dL (ref 3.6–5.1)
Sex Hormone Binding: 12 nmol/L (ref 10–50)
Testosterone: 204 ng/dL — ABNORMAL LOW (ref 250–827)

## 2019-07-09 LAB — ESTROGENS, TOTAL: Estrogen: 146 pg/mL (ref 60–190)

## 2019-07-09 LAB — PROLACTIN: Prolactin: 17.4 ng/mL (ref 2.0–18.0)

## 2019-07-11 ENCOUNTER — Encounter: Payer: Self-pay | Admitting: Family Medicine

## 2019-08-06 ENCOUNTER — Telehealth (INDEPENDENT_AMBULATORY_CARE_PROVIDER_SITE_OTHER): Payer: Self-pay | Admitting: Family Medicine

## 2019-08-06 ENCOUNTER — Encounter: Payer: Self-pay | Admitting: Family Medicine

## 2019-08-06 ENCOUNTER — Other Ambulatory Visit: Payer: Self-pay

## 2019-08-06 VITALS — HR 73 | Wt 285.0 lb

## 2019-08-06 DIAGNOSIS — J22 Unspecified acute lower respiratory infection: Secondary | ICD-10-CM

## 2019-08-06 DIAGNOSIS — J069 Acute upper respiratory infection, unspecified: Secondary | ICD-10-CM

## 2019-08-06 MED ORDER — AMOXICILLIN 875 MG PO TABS
875.0000 mg | ORAL_TABLET | Freq: Two times a day (BID) | ORAL | 0 refills | Status: AC
Start: 2019-08-06 — End: 2019-08-16

## 2019-08-06 NOTE — Progress Notes (Signed)
Virtual Visit via Video Note  I connected with Tony [name] on 08/06/19 at  1:00 PM EDT by a video enabled telemedicine application and verified that I am speaking with the correct person using two identifiers.  Location patient: home Location provider:work or home office Persons participating in the virtual visit: patient, provider  I discussed the limitations of evaluation and management by telemedicine and the availability of in person appointments. The patient expressed understanding and agreed to proceed.  Telemedicine visit is a necessity given the COVID-19 restrictions in place at the current time.  HPI: 44 y/o WM with obesity, HTN, and DM who is being seen today for respiratory symptoms. Onset 9 days ago, nausea w/out vomiting, felt cold chills but no rigors, achy all over, Tm100 a few days, then no further fever.  Last few days has started a lot of coughing, mostly in mornings.  Initially not coughing much at all. Feels winded easier/fatigued quicker than his usual.  No SOB at rest.   Covid test neg  He has had covid vaccine, 2nd inj 06/21/19. Current temp 98.2, oxygen sat 96% on RA currently.  HR 72 now. +HA, nasal cong/runny nose/PND.  Some ST mainly in mornings. No pain in face until today. Taking nyquil for cough. Mucinex DM in the past.  Wife with similar illness. ROS: See pertinent positives and negatives per HPI.  Past Medical History:  Diagnosis Date  . Abnormal weight gain 05/25/2015  . Acute appendicitis 02/24/2015  . Alcohol abuse 2016/2017   Tony [name] states he was self medicating his anxiety.  Quit 04/2015.  Minimal intake as of 05/2017 f/u.  Marland Kitchen Anal fissure   . Anxiety   . Diabetes mellitus ([place]) 12/2015   New dx 12/2015  . Hepatic steatosis 2015; 2016; 02/2016   +Alcohol has played a role.  CT abd showed fatty liver+ liver enzymes mildly elevated (chron viral hepatitis screening NEG).  Abd u/s 03/26/16 showed fatty liver, small GB polyps, o/w normal.  . History of kidney stones     [facility]  . HTN (hypertension)   . Hx of colonic polyp - ssp 02/11/2014  . Hyperlipidemia    Statin started 05/2018  . Insomnia   . Obesity, Class II, BMI 35-39.9   . Psoriasis   . Secondary male hypogonadism    clomiphene 06/2019    Past Surgical History:  Procedure Laterality Date  . COLONOSCOPY W/ BIOPSIES  02/03/14   Cecal polyp (sessile serrated polyp w/out dysplasia) and posterior anal fissure; repeat TCS 2020 per Dr. Carlean Purl  . LAPAROSCOPIC APPENDECTOMY N/A 02/24/2015   Procedure: APPENDECTOMY LAPAROSCOPIC;  Surgeon: Alphonsa Overall, MD;  Location: [facility];  Service: General;  Laterality: N/A;  . right ankle     2014    Family History  Problem Relation Age of Onset  . Diabetes Mother   . Restless legs syndrome Mother   . Heart disease Mother   . Heart attack Mother 45  . Psoriasis Father   . Stroke Neg Hx   . Cancer Neg Hx      Current Outpatient Medications:  .  carbamazepine (TEGRETOL) 100 MG chewable tablet, TAKE 1 TABLET BY MOUTH IN THE MORNING THEN TAKE 2 TABLETS BY MOUTH AT BEDTIME, Disp: , Rfl:  .  clomiPHENE (CLOMID) 50 MG tablet, Take 1 tablet (50 mg total) by mouth daily., Disp: 30 tablet, Rfl: 1 .  clonazePAM (KLONOPIN) 1 MG tablet, TAKE 1 TABLET (1 MG TOTAL) BY MOUTH 2 (TWO) TIMES DAILY AS NEEDED.,  Disp: 180 tablet, Rfl: 1 .  Eszopiclone 3 MG TABS, TAKE 1 TABLET BY MOUTH AT BEDTIME AS NEEDED FOR INSOMNIA, Disp: 30 tablet, Rfl: 0 .  lisinopril (ZESTRIL) 10 MG tablet, TAKE 1 TABLET BY MOUTH EVERY DAY, Disp: 30 tablet, Rfl: 1 .  meloxicam (MOBIC) 15 MG tablet, Take 15 mg by mouth daily as needed for pain., Disp: , Rfl:  .  promethazine (PHENERGAN) 12.5 MG tablet, 1-2 TABS BY MOUTH EVERY 6 HRS AS NEEDED FOR NAUSEA/VOMITING, Disp: 30 tablet, Rfl: 11 .  propranolol (INDERAL) 10 MG tablet, Take 1 tablet (10 mg total) by mouth 3 (three) times daily. Rx'd by psych, Disp: 90 tablet, Rfl: 5 .  rosuvastatin (CRESTOR) 5 MG tablet, Take 1 tablet (5 mg total) by mouth  daily., Disp: 30 tablet, Rfl: 2 .  sertraline (ZOLOFT) 25 MG tablet, Take 12.5 mg by mouth daily., Disp: , Rfl:   EXAM:  VITALS per patient if applicable: Pulse 73   Wt 285 lb (129.3 kg)   BMI 41.48 kg/m    GENERAL: alert, oriented, appears well and in no acute distress  HEENT: atraumatic, conjunttiva clear, no obvious abnormalities on inspection of external nose and ears  NECK: normal movements of the head and neck  LUNGS: on inspection no signs of respiratory distress, breathing rate appears normal, no obvious gross SOB, gasping or wheezing  CV: no obvious cyanosis  MS: moves all visible extremities without noticeable abnormality  PSYCH/NEURO: pleasant and cooperative, no obvious depression or anxiety, speech and thought processing grossly intact  LABS: none today    Chemistry      Component Value Date/Time   NA 136 07/02/2019 0815   K 4.1 07/02/2019 0815   CL 101 07/02/2019 0815   CO2 28 07/02/2019 0815   BUN 14 07/02/2019 0815   CREATININE 0.91 07/02/2019 0815      Component Value Date/Time   CALCIUM 8.9 07/02/2019 0815   ALKPHOS 94 07/02/2019 0815   AST 39 (H) 07/02/2019 0815   ALT 55 (H) 07/02/2019 0815   BILITOT 0.3 07/02/2019 0815     Lab Results  Component Value Date   HGBA1C 6.2 07/02/2019   Lab Results  Component Value Date   WBC 5.3 07/02/2019   HGB 14.8 07/02/2019   HCT 43.6 07/02/2019   MCV 97.2 07/02/2019   PLT 167.0 07/02/2019    ASSESSMENT AND PLAN:  Discussed the following assessment and plan:  Prolonged upper and lower resp illness, covid NEG. Low susp of flu, plus he's too far into illness to get any benefit from tamiflu. Recommended amoxil 894m bid x 10d. Mucinex DM and saline nasal spray recommended. Also, tylenol 1000 mg bid for aches, push fluids, rest.  I discussed the assessment and treatment plan with the patient. The patient was provided an opportunity to ask questions and all were answered. The patient agreed with the  plan and demonstrated an understanding of the instructions.   The patient was advised to call back or seek an in-person evaluation if the symptoms worsen or if the condition fails to improve as anticipated.  F/u: if not improving in 3-4 d. Signs/symptoms to call or return for were reviewed and Tony [name] expressed understanding.  Signed:  PCrissie Sickles MD           08/06/2019

## 2019-08-25 ENCOUNTER — Other Ambulatory Visit: Payer: Self-pay | Admitting: Family Medicine

## 2019-09-09 ENCOUNTER — Ambulatory Visit (INDEPENDENT_AMBULATORY_CARE_PROVIDER_SITE_OTHER): Payer: Self-pay | Admitting: Family Medicine

## 2019-09-09 ENCOUNTER — Other Ambulatory Visit: Payer: Self-pay

## 2019-09-09 ENCOUNTER — Encounter: Payer: Self-pay | Admitting: Family Medicine

## 2019-09-09 VITALS — BP 120/76 | HR 78 | Temp 98.0°F | Resp 16 | Ht 69.5 in | Wt 285.2 lb

## 2019-09-09 DIAGNOSIS — Z79899 Other long term (current) drug therapy: Secondary | ICD-10-CM

## 2019-09-09 DIAGNOSIS — F39 Unspecified mood [affective] disorder: Secondary | ICD-10-CM

## 2019-09-09 DIAGNOSIS — E291 Testicular hypofunction: Secondary | ICD-10-CM

## 2019-09-09 DIAGNOSIS — F411 Generalized anxiety disorder: Secondary | ICD-10-CM

## 2019-09-09 DIAGNOSIS — E782 Mixed hyperlipidemia: Secondary | ICD-10-CM

## 2019-09-09 DIAGNOSIS — L219 Seborrheic dermatitis, unspecified: Secondary | ICD-10-CM

## 2019-09-09 DIAGNOSIS — I10 Essential (primary) hypertension: Secondary | ICD-10-CM

## 2019-09-09 LAB — LIPID PANEL
Cholesterol: 156 mg/dL (ref 0–200)
HDL: 33.7 mg/dL — ABNORMAL LOW (ref >39.00)
LDL Cholesterol: 84 mg/dL (ref 0–99)
NonHDL: 122.66
Total CHOL/HDL Ratio: 5
Triglycerides: 191 mg/dL — ABNORMAL HIGH (ref 0.0–149.0)
VLDL: 38.2 mg/dL (ref 0.0–40.0)

## 2019-09-09 LAB — COMPREHENSIVE METABOLIC PANEL
ALT: 50 U/L (ref 0–53)
AST: 44 U/L — ABNORMAL HIGH (ref 0–37)
Albumin: 4.6 g/dL (ref 3.5–5.2)
Alkaline Phosphatase: 67 U/L (ref 39–117)
BUN: 17 mg/dL (ref 6–23)
CO2: 26 mEq/L (ref 19–32)
Calcium: 9.1 mg/dL (ref 8.4–10.5)
Chloride: 101 mEq/L (ref 96–112)
Creatinine, Ser: 1.09 mg/dL (ref 0.40–1.50)
GFR: 73.8 mL/min (ref >60.00)
Glucose, Bld: 100 mg/dL — ABNORMAL HIGH (ref 70–99)
Potassium: 4.1 mEq/L (ref 3.5–5.1)
Sodium: 136 mEq/L (ref 135–145)
Total Bilirubin: 0.5 mg/dL (ref 0.2–1.2)
Total Protein: 7.1 g/dL (ref 6.0–8.3)

## 2019-09-09 LAB — CBC WITH DIFFERENTIAL/PLATELET
Basophils Absolute: 0.1 10*3/uL (ref 0.0–0.1)
Basophils Relative: 1.2 % (ref 0.0–3.0)
Eosinophils Absolute: 0.2 10*3/uL (ref 0.0–0.7)
Eosinophils Relative: 4 % (ref 0.0–5.0)
HCT: 44.3 % (ref 39.0–52.0)
Hemoglobin: 15 g/dL (ref 13.0–17.0)
Lymphocytes Relative: 36.8 % (ref 12.0–46.0)
Lymphs Abs: 1.9 10*3/uL (ref 0.7–4.0)
MCHC: 33.9 g/dL (ref 30.0–36.0)
MCV: 96.5 fl (ref 78.0–100.0)
Monocytes Absolute: 0.5 10*3/uL (ref 0.1–1.0)
Monocytes Relative: 10.4 % (ref 3.0–12.0)
Neutro Abs: 2.5 10*3/uL (ref 1.4–7.7)
Neutrophils Relative %: 47.6 % (ref 43.0–77.0)
Platelets: 164 10*3/uL (ref 150.0–400.0)
RBC: 4.59 Mil/uL (ref 4.22–5.81)
RDW: 12.5 % (ref 11.5–15.5)
WBC: 5.2 10*3/uL (ref 4.0–10.5)

## 2019-09-09 LAB — LUTEINIZING HORMONE: LH: 8.07 m[IU]/mL (ref 1.50–9.30)

## 2019-09-09 LAB — TESTOSTERONE: Testosterone: 526.92 ng/dL (ref 300.00–890.00)

## 2019-09-09 MED ORDER — FLUTICASONE PROPIONATE 0.05 % EX CREA: TOPICAL_CREAM | CUTANEOUS | 1 refills | Status: DC

## 2019-09-09 MED ORDER — CLOMIPHENE CITRATE 50 MG PO TABS: 50.0000 mg | ORAL_TABLET | Freq: Every day | ORAL | 5 refills | Status: DC

## 2019-09-09 NOTE — Progress Notes (Signed)
OFFICE VISIT  09/09/2019   CC:  Chief Complaint  Patient presents with  . Follow-up    cholesterol     HPI:    Patient is a 44 y.o. Caucasian male who presents for 2 mo f/u HLD and secondary hypogonadism. A/P as of last visit: "1) HTN: The current medical regimen is effective;  continue present plan and medications. Lytes/cr today.  2) HLD: did not tolerate atorva.  Will try rosuvastatin 16m daily. FLP today, hepatic panel today.  3) DM: diet only. HbA1c and urine microalb/cr today. Has room for improvement in diet and exercise.  4) GAD: stable. CSC updated today.  5) Hx of hypotestosteronism (remote): ongoing fatigue, dec libido, and some ED. Check testost, cortisol, LH, FSH, estrogen, TSH."  INTERIM HX: Doing fine today. Started clomiphine after labs 2 mo ago returned supporting dx of secondary hypogonadism. No side effects.  No change in libido.  Energy about the same, sleep possibly better.  Has seb derm on face, was rx'd cutivate cream by derm in the past, takes 2 d for it to go away for a while.  Anxiety: cOrvil Feilhas helped, takes it bid, most recent dose this morning.  Also taking tegretol and sertraline--mood and anxiety stable.  HLD: lost 9 lb per his scale.  No change in diet but exercising more. No side effects noted.  PMP AWARE reviewed today: most recent rx for clonaz was filled 05/28/19, # 180, rx by me. Last Eszopiclone rx filled 05/31/19, #30, rx by me. No red flags.  ROS: no fevers, no CP, no SOB, no wheezing, no cough, no dizziness, no HAs, no rashes, no melena/hematochezia.  No polyuria or polydipsia.  No myalgias or arthralgias.  No focal weakness, paresthesias, or tremors.  No acute vision or hearing abnormalities. No n/v/d or abd pain.  No palpitations.    Past Medical History:  Diagnosis Date  . Abnormal weight gain 05/25/2015  . Acute appendicitis 02/24/2015  . Alcohol abuse 2016/2017   Pt states he was self medicating his anxiety.  Quit  04/2015.  Minimal intake as of 05/2017 f/u.  .Marland KitchenAnal fissure   . Anxiety   . Diabetes mellitus ([place] 12/2015   New dx 12/2015  . Hepatic steatosis 2015; 2016; 02/2016   +Alcohol has played a role.  CT abd showed fatty liver+ liver enzymes mildly elevated (chron viral hepatitis screening NEG).  Abd u/s 03/26/16 showed fatty liver, small GB polyps, o/w normal.  . History of kidney stones    [facility]  . HTN (hypertension)   . Hx of colonic polyp - ssp 02/11/2014  . Hyperlipidemia    Statin started 05/2018  . Insomnia   . Obesity, Class II, BMI 35-39.9   . Psoriasis   . Secondary male hypogonadism    clomiphene 06/2019    Past Surgical History:  Procedure Laterality Date  . COLONOSCOPY W/ BIOPSIES  02/03/14   Cecal polyp (sessile serrated polyp w/out dysplasia) and posterior anal fissure; repeat TCS 2020 per Dr. GCarlean Purl . LAPAROSCOPIC APPENDECTOMY N/A 02/24/2015   Procedure: APPENDECTOMY LAPAROSCOPIC;  Surgeon: DAlphonsa Overall MD;  Location: [facility];  Service: General;  Laterality: N/A;  . right ankle     2014    Outpatient Medications Prior to Visit  Medication Sig Dispense Refill  . carbamazepine (TEGRETOL) 100 MG chewable tablet TAKE 1 TABLET BY MOUTH IN THE MORNING THEN TAKE 2 TABLETS BY MOUTH AT BEDTIME    . clonazePAM (KLONOPIN) 1 MG tablet TAKE  1 TABLET (1 MG TOTAL) BY MOUTH 2 (TWO) TIMES DAILY AS NEEDED. 180 tablet 1  . Eszopiclone 3 MG TABS TAKE 1 TABLET BY MOUTH AT BEDTIME AS NEEDED FOR INSOMNIA 30 tablet 0  . lisinopril (ZESTRIL) 10 MG tablet TAKE ONE TABLET BY MOUTH DAILY 90 tablet 0  . meloxicam (MOBIC) 15 MG tablet Take 15 mg by mouth daily as needed for pain.    . promethazine (PHENERGAN) 12.5 MG tablet 1-2 TABS BY MOUTH EVERY 6 HRS AS NEEDED FOR NAUSEA/VOMITING 30 tablet 11  . propranolol (INDERAL) 10 MG tablet Take 1 tablet (10 mg total) by mouth 3 (three) times daily. Rx'd by psych 90 tablet 5  . rosuvastatin (CRESTOR) 5 MG tablet Take 1 tablet (5 mg total) by mouth  daily. 30 tablet 2  . sertraline (ZOLOFT) 25 MG tablet Take 12.5 mg by mouth daily.    . clomiPHENE (CLOMID) 50 MG tablet Take 1 tablet (50 mg total) by mouth daily. 30 tablet 1   No facility-administered medications prior to visit.    No Known Allergies  ROS As per HPI  PE: Blood pressure 120/76, pulse 78, temperature 98 F (36.7 C), temperature source Oral, resp. rate 16, height 5' 9.5" (1.765 m), weight 285 lb 3.2 oz (129.4 kg), SpO2 97 %. Gen: Alert, well appearing.  Patient is oriented to person, place, time, and situation. AFFECT: pleasant, lucid thought and speech. Face: a smattering of soft pink, mildly greasy and superficially flaky rash on face, primarily around gotee area of his beard.  LABS:  Lab Results  Component Value Date   TSH 3.42 07/02/2019   Lab Results  Component Value Date   WBC 5.3 07/02/2019   HGB 14.8 07/02/2019   HCT 43.6 07/02/2019   MCV 97.2 07/02/2019   PLT 167.0 07/02/2019   Lab Results  Component Value Date   CREATININE 0.91 07/02/2019   BUN 14 07/02/2019   NA 136 07/02/2019   K 4.1 07/02/2019   CL 101 07/02/2019   CO2 28 07/02/2019   Lab Results  Component Value Date   ALT 55 (H) 07/02/2019   AST 39 (H) 07/02/2019   ALKPHOS 94 07/02/2019   BILITOT 0.3 07/02/2019   Lab Results  Component Value Date   CHOL 259 (H) 07/02/2019   Lab Results  Component Value Date   HDL 36.30 (L) 07/02/2019   Lab Results  Component Value Date   LDLCALC 141 (H) 06/08/2015   Lab Results  Component Value Date   TRIG (H) 07/02/2019    468.0 Triglyceride is over 400; calculations on Lipids are invalid.   Lab Results  Component Value Date   CHOLHDL 7 07/02/2019   Lab Results  Component Value Date   HGBA1C 6.2 07/02/2019   Lab Results  Component Value Date   TESTOSTERONE 204 (L) 07/02/2019    IMPRESSION AND PLAN:  1) HLD: tolerating recent start of statin. FLP and hepatic panel today.  2) HTN: The current medical regimen is  effective;  continue present plan and medications. Lytes/cr today.  3) Secondary hypogonadism: tolerating clomiphene so far but not feeling any therapeutic effect yet.  Recheck testost and LH today as well as monitor cbc.  4) Seb derm of face: rf'd cutivate 0.05% cream that he uses very sparingly and it has been very effective.  5) Anxiety and mood d/o: all stable. No changes in meds today. CSC UTD. Will do UDS today--clonaz should be present.  An After Visit Summary was printed  and given to the patient.  FOLLOW UP: Return in about 6 months (around 03/11/2020) for annual CPE (fasting) + f/u chronic illnesses.  Signed:  Crissie Sickles, MD           09/09/2019

## 2019-09-11 LAB — DM TEMPLATE

## 2019-09-11 LAB — DRUG MONITORING, PANEL 8 WITH CONFIRMATION, URINE
6 Acetylmorphine: NEGATIVE ng/mL (ref <10)
Alcohol Metabolites: NEGATIVE ng/mL
Alphahydroxyalprazolam: NEGATIVE ng/mL (ref <25)
Alphahydroxymidazolam: NEGATIVE ng/mL (ref <50)
Alphahydroxytriazolam: NEGATIVE ng/mL (ref <50)
Aminoclonazepam: 188 ng/mL — ABNORMAL HIGH (ref <25)
Amphetamines: NEGATIVE ng/mL (ref <500)
Benzodiazepines: POSITIVE ng/mL — AB (ref <100)
Buprenorphine, Urine: NEGATIVE ng/mL (ref <5)
Cocaine Metabolite: NEGATIVE ng/mL (ref <150)
Creatinine: 136.6 mg/dL
Hydroxyethylflurazepam: NEGATIVE ng/mL (ref <50)
Lorazepam: NEGATIVE ng/mL (ref <50)
MDMA: NEGATIVE ng/mL (ref <500)
Marijuana Metabolite: NEGATIVE ng/mL (ref <20)
Nordiazepam: NEGATIVE ng/mL (ref <50)
Opiates: NEGATIVE ng/mL (ref <100)
Oxazepam: NEGATIVE ng/mL (ref <50)
Oxidant: NEGATIVE ug/mL
Oxycodone: NEGATIVE ng/mL (ref <100)
Temazepam: NEGATIVE ng/mL (ref <50)
pH: 5.3 (ref 4.5–9.0)

## 2019-09-12 ENCOUNTER — Encounter: Payer: Self-pay | Admitting: Family Medicine

## 2019-09-13 ENCOUNTER — Other Ambulatory Visit: Payer: Self-pay | Admitting: Family Medicine

## 2019-09-14 NOTE — Telephone Encounter (Signed)
Last office visit- 09-09-2019 Last refill- 05-31-2019 Next office visit -03-13-2020 UDS- 09-09-2019

## 2019-09-27 ENCOUNTER — Telehealth: Payer: Self-pay | Admitting: Family Medicine

## 2019-09-27 NOTE — Telephone Encounter (Signed)
Patient asking to be seen by Dr Tamala Julian. He is friends with Forest Becker and is having extreme low back pain.  Please advise.

## 2019-09-27 NOTE — Telephone Encounter (Signed)
Voicemail box full. Sent patient MyChart message.

## 2019-09-29 ENCOUNTER — Ambulatory Visit (INDEPENDENT_AMBULATORY_CARE_PROVIDER_SITE_OTHER): Payer: Self-pay | Admitting: Family Medicine

## 2019-09-29 ENCOUNTER — Ambulatory Visit (INDEPENDENT_AMBULATORY_CARE_PROVIDER_SITE_OTHER): Payer: Self-pay

## 2019-09-29 ENCOUNTER — Other Ambulatory Visit: Payer: Self-pay

## 2019-09-29 ENCOUNTER — Encounter: Payer: Self-pay | Admitting: Family Medicine

## 2019-09-29 VITALS — BP 110/80 | HR 97 | Ht 69.5 in | Wt 283.0 lb

## 2019-09-29 DIAGNOSIS — G8929 Other chronic pain: Secondary | ICD-10-CM

## 2019-09-29 DIAGNOSIS — M545 Low back pain: Secondary | ICD-10-CM

## 2019-09-29 DIAGNOSIS — M5136 Other intervertebral disc degeneration, lumbar region: Secondary | ICD-10-CM

## 2019-09-29 MED ORDER — PREDNISONE 50 MG PO TABS
ORAL_TABLET | ORAL | 0 refills | Status: DC
Start: 2019-09-29 — End: 2020-02-01

## 2019-09-29 MED ORDER — GABAPENTIN 100 MG PO CAPS
200.0000 mg | ORAL_CAPSULE | Freq: Every day | ORAL | 3 refills | Status: DC
Start: 2019-09-29 — End: 2019-09-29

## 2019-09-29 MED ORDER — KETOROLAC TROMETHAMINE 60 MG/2ML IM SOLN
60.0000 mg | Freq: Once | INTRAMUSCULAR | Status: AC
  Administered 2019-09-29: 60 mg via INTRAMUSCULAR

## 2019-09-29 MED ORDER — PREDNISONE 50 MG PO TABS
ORAL_TABLET | ORAL | 0 refills | Status: DC
Start: 2019-09-29 — End: 2019-09-29

## 2019-09-29 MED ORDER — GABAPENTIN 100 MG PO CAPS
200.0000 mg | ORAL_CAPSULE | Freq: Every day | ORAL | 3 refills | Status: DC
Start: 2019-09-29 — End: 2020-09-20

## 2019-09-29 MED ORDER — METHYLPREDNISOLONE ACETATE 80 MG/ML IJ SUSP
80.0000 mg | Freq: Once | INTRAMUSCULAR | Status: AC
  Administered 2019-09-29: 80 mg via INTRAMUSCULAR

## 2019-09-29 NOTE — Addendum Note (Signed)
Addended by: Judy Pimple R on: 09/29/2019 04:07 PM   Modules accepted: Orders

## 2019-09-29 NOTE — Assessment & Plan Note (Signed)
No significant pain today.  Patient given Toradol and Depo-Medrol.  Patient does have good range of motion.  Does have what appears to be more of a possible spondylolisthesis that could be exacerbated at this point.  Patient has had this pain on and off for greater than 10 years at this time and may need advanced imaging if this continues.  Patient is in agreement with the plan.  Started prednisone and gabapentin as well home exercises with athletic trainer today.  Follow-up again in 4 to 8 weeks.

## 2019-09-29 NOTE — Patient Instructions (Addendum)
Prednisone 50 mg Gabapentin 200 mg at night Exercise 3 times a week See me again in 2-3 weeks if no improvement call we will get MRI

## 2019-09-29 NOTE — Progress Notes (Signed)
[place] [address] [place] Phone: [phone] Subjective:   I [name] am serving as a Education administrator for Dr. Hulan Saas.  This visit occurred during the SARS-CoV-2 public health emergency.  Safety protocols were in place, including screening questions prior to the visit, additional usage of staff PPE, and extensive cleaning of exam room while observing appropriate contact time as indicated for disinfecting solutions.   I'm seeing this patient by the request  of:  McGowen, Adrian Blackwater, MD  CC: Low back pain   Tony [name] is a 43 y.o. male coming in with complaint of low back pain. Patient states the pain is chronic and right sided. Recently injured his back recently. States he hasn't been able to do anything. Today he is a little better. Most painful with sitting and standing. Has tried flexeril. 10/10 at its worse.  Last imaging patient has had this from 2011 that is available to me.  This was found to be a lumbar spine x-ray event showed an L5-S1 degenerative disc disease with mild anterior listhesis otherwise unremarkable.     Past Medical History:  Diagnosis Date  . Abnormal weight gain 05/25/2015  . Acute appendicitis 02/24/2015  . Alcohol abuse 2016/2017   Pt states he was self medicating his anxiety.  Quit 04/2015.  Minimal intake as of 05/2017 f/u.  Marland Kitchen Anal fissure   . Anxiety   . Diabetes mellitus ([place]) 12/2015   New dx 12/2015  . Hepatic steatosis 2015; 2016; 02/2016   +Alcohol has played a role.  CT abd showed fatty liver+ liver enzymes mildly elevated (chron viral hepatitis screening NEG).  Abd u/s 03/26/16 showed fatty liver, small GB polyps, o/w normal.  . History of kidney stones    [facility]  . HTN (hypertension)   . Hx of colonic polyp - ssp 02/11/2014  . Hyperlipidemia    Statin started 05/2018  . Insomnia   . Obesity, Class II, BMI 35-39.9   . Psoriasis   . Secondary male hypogonadism     clomiphene 06/2019: normalized on clomiphene 08/2019   Past Surgical History:  Procedure Laterality Date  . COLONOSCOPY W/ BIOPSIES  02/03/14   Cecal polyp (sessile serrated polyp w/out dysplasia) and posterior anal fissure; repeat TCS 2020 per Dr. Carlean Purl  . LAPAROSCOPIC APPENDECTOMY N/A 02/24/2015   Procedure: APPENDECTOMY LAPAROSCOPIC;  Surgeon: Alphonsa Overall, MD;  Location: [facility];  Service: General;  Laterality: N/A;  . right ankle     2014   Social History   Socioeconomic History  . Marital status: Married    Spouse name: Not on file  . Number of children: Not on file  . Years of education: Not on file  . Highest education level: Not on file  Occupational History  . Occupation: Secondary school teacher  Tobacco Use  . Smoking status: Never Smoker  . Smokeless tobacco: Never Used  Substance and Sexual Activity  . Alcohol use: Yes    Alcohol/week: 0.0 standard drinks    Comment: 1/5 liquor a day  . Drug use: No  . Sexual activity: Not on file  Other Topics Concern  . Not on file  Social History Narrative   Married (spring 2019), no children.   Orig from [place].   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   No tobacco.   Alcohol: hx of "heavy drinking".  Cut back as of 04/2015.   Social Determinants of Health   Financial Resource Strain:   .  Difficulty of Paying Living Expenses:   Food Insecurity:   . Worried About Charity fundraiser in the Last Year:   . Arboriculturist in the Last Year:   Transportation Needs:   . Film/video editor (Medical):   Marland Kitchen Lack of Transportation (Non-Medical):   Physical Activity:   . Days of Exercise per Week:   . Minutes of Exercise per Session:   Stress:   . Feeling of Stress :   Social Connections:   . Frequency of Communication with Friends and Family:   . Frequency of Social Gatherings with Friends and Family:   . Attends Religious Services:   . Active Member of Clubs or Organizations:   . Attends Archivist Meetings:   Marland Kitchen Marital  Status:    No Known Allergies Family History  Problem Relation Age of Onset  . Diabetes Mother   . Restless legs syndrome Mother   . Heart disease Mother   . Heart attack Mother 75  . Psoriasis Father   . Stroke Neg Hx   . Cancer Neg Hx     Current Outpatient Medications (Endocrine & Metabolic):  .  clomiPHENE (CLOMID) 50 MG tablet, Take 1 tablet (50 mg total) by mouth daily.  Current Outpatient Medications (Cardiovascular):  .  lisinopril (ZESTRIL) 10 MG tablet, TAKE ONE TABLET BY MOUTH DAILY .  propranolol (INDERAL) 10 MG tablet, Take 1 tablet (10 mg total) by mouth 3 (three) times daily. Rx'd by psych .  rosuvastatin (CRESTOR) 5 MG tablet, Take 1 tablet (5 mg total) by mouth daily.  Current Outpatient Medications (Respiratory):  .  promethazine (PHENERGAN) 12.5 MG tablet, 1-2 TABS BY MOUTH EVERY 6 HRS AS NEEDED FOR NAUSEA/VOMITING  Current Outpatient Medications (Analgesics):  .  meloxicam (MOBIC) 15 MG tablet, Take 15 mg by mouth daily as needed for pain.   Current Outpatient Medications (Other):  .  carbamazepine (TEGRETOL) 100 MG chewable tablet, TAKE 1 TABLET BY MOUTH IN THE MORNING THEN TAKE 2 TABLETS BY MOUTH AT BEDTIME .  clonazePAM (KLONOPIN) 1 MG tablet, TAKE ONE TABLET BY MOUTH TWICE A DAY AS NEEDED .  Eszopiclone 3 MG TABS, TAKE ONE TABLET BY MOUTH EVERY NIGHT AT BEDTIME AS NEEDED FOR INSOMNIA .  fluticasone (CUTIVATE) 0.05 % cream, Apply to affected areas bid prn .  sertraline (ZOLOFT) 25 MG tablet, Take 12.5 mg by mouth daily.   Reviewed prior external information including notes and imaging from  primary care provider As well as notes that were available from care everywhere and other healthcare systems.  Past medical history, social, surgical and family history all reviewed in electronic medical record.  No pertanent information unless stated regarding to the chief complaint.   Review of Systems:  No headache, visual changes, nausea, vomiting, diarrhea,  constipation, dizziness, abdominal pain, skin rash, fevers, chills, night sweats, weight loss, swollen lymph nodes, body aches, joint swelling, chest pain, shortness of breath, mood changes. POSITIVE muscle aches  Objective  There were no vitals taken for this visit.   General: No apparent distress alert and oriented x3 mood and affect normal, dressed appropriately.  HEENT: Pupils equal, extraocular movements intact  Respiratory: Patient's speak in full sentences and does not appear short of breath  Cardiovascular: No lower extremity edema, non tender, no erythema  Neuro: Cranial nerves II through XII are intact, neurovascularly intact in all extremities with 2+ DTRs and 2+ pulses.  Gait normal with good balance and coordination.  MSK:  Non tender with full range of motion and good stability and symmetric strength and tone of shoulders, elbows, wrist, hip, knee and ankles bilaterally.  Back Exam:  Inspection: Loss of lordosis and poor core strength Motion: Flexion 35 deg, Extension 25 deg, Side Bending to 35 deg bilaterally,  Rotation to 30 deg bilaterally  SLR laying: Negative  XSLR laying: Negative  Palpable tenderness: Moderate to severely tender to palpation of paraspinal musculature at L4-S1 right greater than left.Marland Kitchen FABER: negative. Sensory change: Gross sensation intact to all lumbar and sacral dermatomes.  Reflexes: 2+ at both patellar tendons, 2+ at achilles tendons, Babinski's downgoing.  Strength at foot  Plantar-flexion: 5/5 Dorsi-flexion: 5/5 Eversion: 5/5 Inversion: 5/5  Leg strength  Quad: 5/5 Hamstring: 5/5 Hip flexor: 5/5 Hip abductors: 5/5   [id number]; 15 additional minutes spent for Therapeutic exercises as stated in above notes.  This included exercises focusing on stretching, strengthening, with significant focus on eccentric aspects.   Long term goals include an improvement in range of motion, strength, endurance as well as avoiding reinjury. Patient's frequency would  include in 1-2 times a day, 3-5 times a week for a duration of 6-12 weeks. Low back exercises that included:  Pelvic tilt/bracing instruction to focus on control of the pelvic girdle and lower abdominal muscles  Glute strengthening exercises, focusing on proper firing of the glutes without engaging the low back muscles Proper stretching techniques for maximum relief for the hamstrings, hip flexors, low back and some rotation where tolerated    Proper technique shown and discussed handout in great detail with ATC.  All questions were discussed and answered.     Impression and Recommendations:     The above documentation has been reviewed and is accurate and complete Lyndal Pulley, DO       Note: This dictation was prepared with Dragon dictation along with smaller phrase technology. Any transcriptional errors that result from this process are unintentional.

## 2019-10-07 ENCOUNTER — Other Ambulatory Visit: Payer: Self-pay

## 2019-10-07 MED ORDER — ROSUVASTATIN CALCIUM 5 MG PO TABS: 5.0000 mg | ORAL_TABLET | Freq: Every day | ORAL | 1 refills | Status: DC

## 2019-10-19 ENCOUNTER — Encounter: Payer: Self-pay | Admitting: Family Medicine

## 2019-10-19 ENCOUNTER — Other Ambulatory Visit: Payer: Self-pay

## 2019-10-19 ENCOUNTER — Ambulatory Visit (INDEPENDENT_AMBULATORY_CARE_PROVIDER_SITE_OTHER): Payer: Self-pay | Admitting: Family Medicine

## 2019-10-19 DIAGNOSIS — M5136 Other intervertebral disc degeneration, lumbar region: Secondary | ICD-10-CM

## 2019-10-19 DIAGNOSIS — M999 Biomechanical lesion, unspecified: Secondary | ICD-10-CM | POA: Insufficient documentation

## 2019-10-19 NOTE — Assessment & Plan Note (Signed)

## 2019-10-19 NOTE — Progress Notes (Signed)
[place] Phone: [phone] Subjective:    I'm seeing this patient by the request  of:  McGowen, Adrian Blackwater, MD  CC: Low back pain follow-up  [id number]  Tony [name] is a 44 y.o. male coming in with complaint of low back pain.  Patient was seen 3 weeks ago with what seemed to be more radicular symptoms and severe tightness of the low back.  Patient did have x-rays that were independently visualized by me.  Found to have moderate narrowing of the L4-L5 and L5-S1 but otherwise fairly unremarkable. Patient states that the radicular symptoms seem to be improving a little bit.  Patient states that the pain has not been quite as severe.  Doing the exercises occasionally.  States that if he lifts repetitively such as concrete 80 pound balance and seems to aggravated.  Patient states sometimes at night gives her trouble.  Gabapentin has been helpful but he does not want to have to take the medicine regularly.     Past Medical History:  Diagnosis Date  . Abnormal weight gain 05/25/2015  . Acute appendicitis 02/24/2015  . Alcohol abuse 2016/2017   Pt states he was self medicating his anxiety.  Quit 04/2015.  Minimal intake as of 05/2017 f/u.  Marland Kitchen Anal fissure   . Anxiety   . Diabetes mellitus ([place]) 12/2015   New dx 12/2015  . Hepatic steatosis 2015; 2016; 02/2016   +Alcohol has played a role.  CT abd showed fatty liver+ liver enzymes mildly elevated (chron viral hepatitis screening NEG).  Abd u/s 03/26/16 showed fatty liver, small GB polyps, o/w normal.  . History of kidney stones    [facility]  . HTN (hypertension)   . Hx of colonic polyp - ssp 02/11/2014  . Hyperlipidemia    Statin started 05/2018  . Insomnia   . Obesity, Class II, BMI 35-39.9   . Psoriasis   . Secondary male hypogonadism    clomiphene 06/2019: normalized on clomiphene 08/2019   Past Surgical History:  Procedure Laterality Date  . COLONOSCOPY W/  BIOPSIES  02/03/14   Cecal polyp (sessile serrated polyp w/out dysplasia) and posterior anal fissure; repeat TCS 2020 per Dr. Carlean Purl  . LAPAROSCOPIC APPENDECTOMY N/A 02/24/2015   Procedure: APPENDECTOMY LAPAROSCOPIC;  Surgeon: Alphonsa Overall, MD;  Location: [facility];  Service: General;  Laterality: N/A;  . right ankle     2014   Social History   Socioeconomic History  . Marital status: Married    Spouse name: Not on file  . Number of children: Not on file  . Years of education: Not on file  . Highest education level: Not on file  Occupational History  . Occupation: Secondary school teacher  Tobacco Use  . Smoking status: Never Smoker  . Smokeless tobacco: Never Used  Substance and Sexual Activity  . Alcohol use: Yes    Alcohol/week: 0.0 standard drinks    Comment: 1/5 liquor a day  . Drug use: No  . Sexual activity: Not on file  Other Topics Concern  . Not on file  Social History Narrative   Married (spring 2019), no children.   Orig from [place].   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   No tobacco.   Alcohol: hx of "heavy drinking".  Cut back as of 04/2015.   Social Determinants of Health   Financial Resource Strain:   . Difficulty of Paying Living Expenses: Not on file  Food Insecurity:   .  Worried About Charity fundraiser in the Last Year: Not on file  . Ran Out of Food in the Last Year: Not on file  Transportation Needs:   . Lack of Transportation (Medical): Not on file  . Lack of Transportation (Non-Medical): Not on file  Physical Activity:   . Days of Exercise per Week: Not on file  . Minutes of Exercise per Session: Not on file  Stress:   . Feeling of Stress : Not on file  Social Connections:   . Frequency of Communication with Friends and Family: Not on file  . Frequency of Social Gatherings with Friends and Family: Not on file  . Attends Religious Services: Not on file  . Active Member of Clubs or Organizations: Not on file  . Attends Archivist Meetings: Not on file   . Marital Status: Not on file   No Known Allergies Family History  Problem Relation Age of Onset  . Diabetes Mother   . Restless legs syndrome Mother   . Heart disease Mother   . Heart attack Mother 5  . Psoriasis Father   . Stroke Neg Hx   . Cancer Neg Hx     Current Outpatient Medications (Endocrine & Metabolic):  .  clomiPHENE (CLOMID) 50 MG tablet, Take 1 tablet (50 mg total) by mouth daily. .  predniSONE (DELTASONE) 50 MG tablet, 1 tablet by mouth daily  Current Outpatient Medications (Cardiovascular):  .  lisinopril (ZESTRIL) 10 MG tablet, TAKE ONE TABLET BY MOUTH DAILY .  propranolol (INDERAL) 10 MG tablet, Take 1 tablet (10 mg total) by mouth 3 (three) times daily. Rx'd by psych .  rosuvastatin (CRESTOR) 5 MG tablet, Take 1 tablet (5 mg total) by mouth daily.  Current Outpatient Medications (Respiratory):  .  promethazine (PHENERGAN) 12.5 MG tablet, 1-2 TABS BY MOUTH EVERY 6 HRS AS NEEDED FOR NAUSEA/VOMITING  Current Outpatient Medications (Analgesics):  .  meloxicam (MOBIC) 15 MG tablet, Take 15 mg by mouth daily as needed for pain.   Current Outpatient Medications (Other):  .  carbamazepine (TEGRETOL) 100 MG chewable tablet, TAKE 1 TABLET BY MOUTH IN THE MORNING THEN TAKE 2 TABLETS BY MOUTH AT BEDTIME .  clonazePAM (KLONOPIN) 1 MG tablet, TAKE ONE TABLET BY MOUTH TWICE A DAY AS NEEDED .  Eszopiclone 3 MG TABS, TAKE ONE TABLET BY MOUTH EVERY NIGHT AT BEDTIME AS NEEDED FOR INSOMNIA .  fluticasone (CUTIVATE) 0.05 % cream, Apply to affected areas bid prn .  gabapentin (NEURONTIN) 100 MG capsule, Take 2 capsules (200 mg total) by mouth at bedtime. .  sertraline (ZOLOFT) 25 MG tablet, Take 12.5 mg by mouth daily.   Reviewed prior external information including notes and imaging from  primary care provider As well as notes that were available from care everywhere and other healthcare systems.  Past medical history, social, surgical and family history all reviewed in  electronic medical record.  No pertanent information unless stated regarding to the chief complaint.   Review of Systems:  No headache, visual changes, nausea, vomiting, diarrhea, constipation, dizziness, abdominal pain, skin rash, fevers, chills, night sweats, weight loss, swollen lymph nodes, body aches, joint swelling, chest pain, shortness of breath, mood changes. POSITIVE muscle aches  Objective  Blood pressure 118/80, pulse 60, height 5' 9.5" (1.765 m), weight 279 lb (126.6 kg), SpO2 97 %.   General: No apparent distress alert and oriented x3 mood and affect normal, dressed appropriately.  HEENT: Pupils equal, extraocular movements intact  Respiratory:  Patient's speak in full sentences and does not appear short of breath  Cardiovascular: No lower extremity edema, non tender, no erythema  Neuro: Cranial nerves II through XII are intact, neurovascularly intact in all extremities with 2+ DTRs and 2+ pulses.  Gait normal with good balance and coordination.  MSK:  Non tender with full range of motion and good stability and symmetric strength and tone of shoulders, elbows, wrist, hip, knee and ankles bilaterally.   Back exam does have poor core strength noted.  Tightness noted especially of the hip flexor right greater than left.  No difficulty with Corky Sox on the right side.  Negative straight leg test but tightness with straight leg  Osteopathic findings  T6 extended rotated and side bent left L4 flexed rotated and side bent right Sacrum right on right    Impression and Recommendations:     The above documentation has been reviewed and is accurate and complete Lyndal Pulley, DO       Note: This dictation was prepared with Dragon dictation along with smaller phrase technology. Any transcriptional errors that result from this process are unintentional.

## 2019-10-19 NOTE — Patient Instructions (Signed)
Keep working on core strength Heat after activity ice after Continue gabapentin See me again in 5-6 weeks

## 2019-10-19 NOTE — Assessment & Plan Note (Signed)
Known degenerative disc disease with mild radicular symptoms.  Meloxicam, gabapentin discussed again.  Attempted osteopathic manipulation.  Hoping patient will make some improvement.  Worsening pain we will consider the possibility of MRI.  Patient is in agreement with the plan when well follow-up again in 5 to 6 weeks.

## 2019-10-19 NOTE — Progress Notes (Signed)
Tony [name] [address] [place] Phone: [phone] Subjective:   I Tony [name] am serving as a Education administrator for Dr. Hulan Saas.  This visit occurred during the SARS-CoV-2 public health emergency.  Safety protocols were in place, including screening questions prior to the visit, additional usage of staff PPE, and extensive cleaning of exam room while observing appropriate contact time as indicated for disinfecting solutions.   I'm seeing this patient by the request  of:  McGowen, Adrian Blackwater, MD  CC:   [id number]   09/29/2019 No significant pain today.  Patient given Toradol and Depo-Medrol.  Patient does have good range of motion.  Does have what appears to be more of a possible spondylolisthesis that could be exacerbated at this point.  Patient has had this pain on and off for greater than 10 years at this time and may need advanced imaging if this continues.  Patient is in agreement with the plan.  Started prednisone and gabapentin as well home exercises with athletic trainer today.  Follow-up again in 4 to 8 weeks.  Update 10/19/2019 Tony [name] is a 44 y.o. male coming in with complaint of back pain. Patient states he is still in pain. Not as bad as it was.     Past Medical History:  Diagnosis Date  . Abnormal weight gain 05/25/2015  . Acute appendicitis 02/24/2015  . Alcohol abuse 2016/2017   Pt states he was self medicating his anxiety.  Quit 04/2015.  Minimal intake as of 05/2017 f/u.  Marland Kitchen Anal fissure   . Anxiety   . Diabetes mellitus ([place]) 12/2015   New dx 12/2015  . Hepatic steatosis 2015; 2016; 02/2016   +Alcohol has played a role.  CT abd showed fatty liver+ liver enzymes mildly elevated (chron viral hepatitis screening NEG).  Abd u/s 03/26/16 showed fatty liver, small GB polyps, o/w normal.  . History of kidney stones    [facility]  . HTN (hypertension)   . Hx of colonic polyp - ssp 02/11/2014  . Hyperlipidemia    Statin  started 05/2018  . Insomnia   . Obesity, Class II, BMI 35-39.9   . Psoriasis   . Secondary male hypogonadism    clomiphene 06/2019: normalized on clomiphene 08/2019   Past Surgical History:  Procedure Laterality Date  . COLONOSCOPY W/ BIOPSIES  02/03/14   Cecal polyp (sessile serrated polyp w/out dysplasia) and posterior anal fissure; repeat TCS 2020 per Dr. Carlean Purl  . LAPAROSCOPIC APPENDECTOMY N/A 02/24/2015   Procedure: APPENDECTOMY LAPAROSCOPIC;  Surgeon: Alphonsa Overall, MD;  Location: [facility];  Service: General;  Laterality: N/A;  . right ankle     2014   Social History   Socioeconomic History  . Marital status: Married    Spouse name: Not on file  . Number of children: Not on file  . Years of education: Not on file  . Highest education level: Not on file  Occupational History  . Occupation: Secondary school teacher  Tobacco Use  . Smoking status: Never Smoker  . Smokeless tobacco: Never Used  Substance and Sexual Activity  . Alcohol use: Yes    Alcohol/week: 0.0 standard drinks    Comment: 1/5 liquor a day  . Drug use: No  . Sexual activity: Not on file  Other Topics Concern  . Not on file  Social History Narrative   Married (spring 2019), no children.   Orig from [place].   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   No  tobacco.   Alcohol: hx of "heavy drinking".  Cut back as of 04/2015.   Social Determinants of Health   Financial Resource Strain:   . Difficulty of Paying Living Expenses: Not on file  Food Insecurity:   . Worried About Charity fundraiser in the Last Year: Not on file  . Ran Out of Food in the Last Year: Not on file  Transportation Needs:   . Lack of Transportation (Medical): Not on file  . Lack of Transportation (Non-Medical): Not on file  Physical Activity:   . Days of Exercise per Week: Not on file  . Minutes of Exercise per Session: Not on file  Stress:   . Feeling of Stress : Not on file  Social Connections:   . Frequency of Communication with Friends and  Family: Not on file  . Frequency of Social Gatherings with Friends and Family: Not on file  . Attends Religious Services: Not on file  . Active Member of Clubs or Organizations: Not on file  . Attends Archivist Meetings: Not on file  . Marital Status: Not on file   No Known Allergies Family History  Problem Relation Age of Onset  . Diabetes Mother   . Restless legs syndrome Mother   . Heart disease Mother   . Heart attack Mother 60  . Psoriasis Father   . Stroke Neg Hx   . Cancer Neg Hx     Current Outpatient Medications (Endocrine & Metabolic):  .  clomiPHENE (CLOMID) 50 MG tablet, Take 1 tablet (50 mg total) by mouth daily. .  predniSONE (DELTASONE) 50 MG tablet, 1 tablet by mouth daily  Current Outpatient Medications (Cardiovascular):  .  lisinopril (ZESTRIL) 10 MG tablet, TAKE ONE TABLET BY MOUTH DAILY .  propranolol (INDERAL) 10 MG tablet, Take 1 tablet (10 mg total) by mouth 3 (three) times daily. Rx'd by psych .  rosuvastatin (CRESTOR) 5 MG tablet, Take 1 tablet (5 mg total) by mouth daily.  Current Outpatient Medications (Respiratory):  .  promethazine (PHENERGAN) 12.5 MG tablet, 1-2 TABS BY MOUTH EVERY 6 HRS AS NEEDED FOR NAUSEA/VOMITING  Current Outpatient Medications (Analgesics):  .  meloxicam (MOBIC) 15 MG tablet, Take 15 mg by mouth daily as needed for pain.   Current Outpatient Medications (Other):  .  carbamazepine (TEGRETOL) 100 MG chewable tablet, TAKE 1 TABLET BY MOUTH IN THE MORNING THEN TAKE 2 TABLETS BY MOUTH AT BEDTIME .  clonazePAM (KLONOPIN) 1 MG tablet, TAKE ONE TABLET BY MOUTH TWICE A DAY AS NEEDED .  Eszopiclone 3 MG TABS, TAKE ONE TABLET BY MOUTH EVERY NIGHT AT BEDTIME AS NEEDED FOR INSOMNIA .  fluticasone (CUTIVATE) 0.05 % cream, Apply to affected areas bid prn .  gabapentin (NEURONTIN) 100 MG capsule, Take 2 capsules (200 mg total) by mouth at bedtime. .  sertraline (ZOLOFT) 25 MG tablet, Take 12.5 mg by mouth daily.   Reviewed  prior external information including notes and imaging from  primary care provider As well as notes that were available from care everywhere and other healthcare systems.  Past medical history, social, surgical and family history all reviewed in electronic medical record.  No pertanent information unless stated regarding to the chief complaint.   Review of Systems:  No headache, visual changes, nausea, vomiting, diarrhea, constipation, dizziness, abdominal pain, skin rash, fevers, chills, night sweats, weight loss, swollen lymph nodes, body aches, joint swelling, chest pain, shortness of breath, mood changes. POSITIVE muscle aches  Objective  There  were no vitals taken for this visit.   General: No apparent distress alert and oriented x3 mood and affect normal, dressed appropriately.  HEENT: Pupils equal, extraocular movements intact  Respiratory: Patient's speak in full sentences and does not appear short of breath  Cardiovascular: No lower extremity edema, non tender, no erythema  Neuro: Cranial nerves II through XII are intact, neurovascularly intact in all extremities with 2+ DTRs and 2+ pulses.  Gait normal with good balance and coordination.  MSK:  Non tender with full range of motion and good stability and symmetric strength and tone of shoulders, elbows, wrist, hip, knee and ankles bilaterally.     Impression and Recommendations:     The above documentation has been reviewed and is accurate and complete Jacqualin Combes       Note: This dictation was prepared with Dragon dictation along with smaller phrase technology. Any transcriptional errors that result from this process are unintentional.

## 2019-11-22 ENCOUNTER — Other Ambulatory Visit: Payer: Self-pay

## 2019-11-22 MED ORDER — LISINOPRIL 10 MG PO TABS: 10.0000 mg | ORAL_TABLET | Freq: Every day | ORAL | 0 refills | Status: DC

## 2019-12-01 ENCOUNTER — Encounter: Payer: Self-pay | Admitting: Family Medicine

## 2019-12-01 ENCOUNTER — Ambulatory Visit (INDEPENDENT_AMBULATORY_CARE_PROVIDER_SITE_OTHER): Payer: Self-pay | Admitting: Family Medicine

## 2019-12-01 ENCOUNTER — Other Ambulatory Visit: Payer: Self-pay

## 2019-12-01 VITALS — BP 102/70 | HR 68 | Ht 69.0 in | Wt 273.0 lb

## 2019-12-01 DIAGNOSIS — M999 Biomechanical lesion, unspecified: Secondary | ICD-10-CM

## 2019-12-01 DIAGNOSIS — M5136 Other intervertebral disc degeneration, lumbar region: Secondary | ICD-10-CM

## 2019-12-01 MED ORDER — VITAMIN D (ERGOCALCIFEROL) 1.25 MG (50000 UNIT) PO CAPS
50000.0000 [IU] | ORAL_CAPSULE | ORAL | 0 refills | Status: DC
Start: 2019-12-01 — End: 2020-02-21

## 2019-12-01 NOTE — Patient Instructions (Signed)
Once weekly vitamin D Try golfing range  Exercise See me again in 2 months

## 2019-12-01 NOTE — Assessment & Plan Note (Signed)
Degenerative disc disease.  Patient is making some progress.  Has lost 21 pounds and I encouraged him to continue all the other medicines.  We will continue with the weight loss.  Increase icing and home exercises.  Patient can start some repetitive activity again.  Follow-up with me again 2 months

## 2019-12-01 NOTE — Progress Notes (Signed)
[place] Phone: [phone] Subjective:   Tony [name], am serving as a scribe for Dr. Hulan Saas. This visit occurred during the SARS-CoV-2 public health emergency.  Safety protocols were in place, including screening questions prior to the visit, additional usage of staff PPE, and extensive cleaning of exam room while observing appropriate contact time as indicated for disinfecting solutions.   I'm seeing this patient by the request  of:  McGowen, Adrian Blackwater, MD  CC: Low back pain follow-up  [id number]  Tony [name] is a 44 y.o. male coming in with complaint of back and neck pain. OMT 10/19/2019. Patient states that his back pain has not been as bad as it was last visit. Pain continues with lumbar flexion. Has been using gabapentin and massage gun nightly.  Patient denies though any worsening of any of the pain.  [name] new symptoms.  Medications patient has been prescribed: Gabapentin  Taking: Yes         Reviewed prior external information including notes and imaging from previsou exam, outside providers and external EMR if available.   As well as notes that were available from care everywhere and other healthcare systems.  Past medical history, social, surgical and family history all reviewed in electronic medical record.  [name] pertanent information unless stated regarding to the chief complaint.   Past Medical History:  Diagnosis Date  . Abnormal weight gain 05/25/2015  . Acute appendicitis 02/24/2015  . Alcohol abuse 2016/2017   Pt states he was self medicating his anxiety.  Quit 04/2015.  Minimal intake as of 05/2017 f/u.  Marland Kitchen Anal fissure   . Anxiety   . Diabetes mellitus ([place]) 12/2015   New dx 12/2015  . Hepatic steatosis 2015; 2016; 02/2016   +Alcohol has played a role.  CT abd showed fatty liver+ liver enzymes mildly elevated (chron viral hepatitis screening NEG).  Abd u/s 03/26/16 showed fatty  liver, small GB polyps, o/w normal.  . History of kidney stones    [facility]  . HTN (hypertension)   . Hx of colonic polyp - ssp 02/11/2014  . Hyperlipidemia    Statin started 05/2018  . Insomnia   . Obesity, Class II, BMI 35-39.9   . Psoriasis   . Secondary male hypogonadism    clomiphene 06/2019: normalized on clomiphene 08/2019    [name] Known Allergies   Review of Systems:  [name] headache, visual changes, nausea, vomiting, diarrhea, constipation, dizziness, abdominal pain, skin rash, fevers, chills, night sweats, weight loss, swollen lymph nodes, body aches, joint swelling, chest pain, shortness of breath, mood changes. POSITIVE muscle aches  Objective  There were [name] vitals taken for this visit.   General: [name] apparent distress alert and oriented x3 mood and affect normal, dressed appropriately.  HEENT: Pupils equal, extraocular movements intact  Respiratory: Patient's speak in full sentences and does not appear short of breath  Cardiovascular: [name] lower extremity edema, non tender, [name] erythema  Neuro: Cranial nerves II through XII are intact, neurovascularly intact in all extremities with 2+ DTRs and 2+ pulses.  Gait normal with good balance and coordination.  MSK:  Non tender with full range of motion and good stability and symmetric strength and tone of shoulders, elbows, wrist, hip, knee and ankles bilaterally.  Back -patient has had improvement in the range of motion.  Patient still has tightness noted in the paraspinal musculature.  Patient has improvement in core strength as well.  Osteopathic findings   T7 extended rotated and side bent left L4 flexed rotated and side bent left Sacrum right on right       Assessment and Plan:  Degenerative disc disease, lumbar Degenerative disc disease.  Patient is making some progress.  Has lost 21 pounds and I encouraged him to continue all the other medicines.  We will continue with the weight loss.  Increase icing and home  exercises.  Patient can start some repetitive activity again.  Follow-up with me again 2 months    Nonallopathic problems  Decision today to treat with OMT was based on Physical Exam  After verbal consent patient was treated with HVLA, ME, FPR techniques in  thoracic, lumbar, and sacral  areas  Patient tolerated the procedure well with improvement in symptoms  Patient given exercises, stretches and lifestyle modifications  See medications in patient instructions if given  Patient will follow up in 4-8 weeks      The above documentation has been reviewed and is accurate and complete Lyndal Pulley, DO       Note: This dictation was prepared with Dragon dictation along with smaller phrase technology. Any transcriptional errors that result from this process are unintentional.

## 2020-01-12 ENCOUNTER — Encounter: Payer: Self-pay | Admitting: Internal Medicine

## 2020-01-31 ENCOUNTER — Encounter: Payer: Self-pay | Admitting: Family Medicine

## 2020-01-31 ENCOUNTER — Ambulatory Visit (INDEPENDENT_AMBULATORY_CARE_PROVIDER_SITE_OTHER): Payer: BC Managed Care – PPO

## 2020-01-31 ENCOUNTER — Other Ambulatory Visit: Payer: Self-pay

## 2020-01-31 DIAGNOSIS — Z23 Encounter for immunization: Secondary | ICD-10-CM

## 2020-01-31 NOTE — Progress Notes (Signed)
[facility] [place] Phone: [phone] Subjective:   Tony [name], am serving as a scribe for Dr. Hulan Saas. This visit occurred during the SARS-CoV-2 public health emergency.  Safety protocols were in place, including screening questions prior to the visit, additional usage of staff PPE, and extensive cleaning of exam room while observing appropriate contact time as indicated for disinfecting solutions.   I'm seeing this patient by the request  of:  McGowen, Adrian Blackwater, MD  CC: Low back pain follow-up  [id number]  Tony [name] is a 44 y.o. male coming in with complaint of back and neck pain. OMT 12/01/2019. Patient states hard surfaces with dress shoes lower back is bad. States had to stand in line for about an hour and lower back started to hurt really bad. Patient states he is going on vacation soon to [facility] and is worried about the pain and the standing around.  Medications patient has been prescribed: Gabapentin, Vit D  Taking: Gabapentin; Vit D     Low back x-rays taken August 2021 were independently visualized by me showing the patient does have moderate disc space narrowing at L4-L5 and L5-S1    Reviewed prior external information including notes and imaging from previsou exam, outside providers and external EMR if available.   As well as notes that were available from care everywhere and other healthcare systems.  Past medical history, social, surgical and family history all reviewed in electronic medical record.  No pertanent information unless stated regarding to the chief complaint.   Past Medical History:  Diagnosis Date  . Abnormal weight gain 05/25/2015  . Acute appendicitis 02/24/2015  . Alcohol abuse 2016/2017   Pt states he was self medicating his anxiety.  Quit 04/2015.  Minimal intake as of 05/2017 f/u.  Marland Kitchen Anal fissure   . Anxiety   . Diabetes mellitus ([place]) 12/2015   New dx 12/2015  .  Hepatic steatosis 2015; 2016; 02/2016   +Alcohol has played a role.  CT abd showed fatty liver+ liver enzymes mildly elevated (chron viral hepatitis screening NEG).  Abd u/s 03/26/16 showed fatty liver, small GB polyps, o/w normal.  . History of kidney stones    [facility]  . HTN (hypertension)   . Hx of colonic polyp - ssp 02/11/2014  . Hyperlipidemia    Statin started 05/2018  . Insomnia   . Obesity, Class II, BMI 35-39.9   . Psoriasis   . Secondary male hypogonadism    clomiphene 06/2019: normalized on clomiphene 08/2019    No Known Allergies   Review of Systems:  No headache, visual changes, nausea, vomiting, diarrhea, constipation, dizziness, abdominal pain, skin rash, fevers, chills, night sweats, weight loss, swollen lymph nodes, body aches, joint swelling, chest pain, shortness of breath, mood changes. POSITIVE muscle aches  Objective  Blood pressure 120/78, pulse 68, height 5' 9"  (1.753 m), weight 273 lb (123.8 kg), SpO2 96 %.   General: No apparent distress alert and oriented x3 mood and affect normal, dressed appropriately.  HEENT: Pupils equal, extraocular movements intact  Respiratory: Patient's speak in full sentences and does not appear short of breath  Cardiovascular: No lower extremity edema, non tender, no erythema  Neuro: Cranial nerves II through XII are intact, neurovascularly intact in all extremities with 2+ DTRs and 2+ pulses.  Gait normal with good balance and coordination.  MSK:  Non tender with full range of motion and good stability and symmetric  strength and tone of shoulders, elbows, wrist, hip, knee and ankles bilaterally.  Back -back exam does show some loss of lordosis, some tenderness to palpation in the paraspinal musculature of the lumbar spine right greater than left.  Tightness with FABER test.  Negative straight leg test.  Osteopathic findings  C2 flexed rotated and side bent right C6 flexed rotated and side bent left T3 extended rotated and  side bent right inhaled rib T9 extended rotated and side bent left L2 flexed rotated and side bent right Sacrum right on right       Assessment and Plan:  Degenerative disc disease, lumbar Known degenerative disc disease.  Seems to be stable at this time but has had exacerbation fairly recently.  Patient given prednisone with him having to travel has taken anti-inflammatories previously as well as the gabapentin.  Discussed can start those medications again if needed.  Discussed posture and ergonomics.  Follow-up again 4-8 weeks    Nonallopathic problems  Decision today to treat with OMT was based on Physical Exam  After verbal consent patient was treated with HVLA, ME, FPR techniques in  thoracic, lumbar, and sacral  areas  Patient tolerated the procedure well with improvement in symptoms  Patient given exercises, stretches and lifestyle modifications  See medications in patient instructions if given  Patient will follow up in 4-8 weeks      The above documentation has been reviewed and is accurate and complete Lyndal Pulley, DO       Note: This dictation was prepared with Dragon dictation along with smaller phrase technology. Any transcriptional errors that result from this process are unintentional.

## 2020-02-01 ENCOUNTER — Telehealth: Payer: Self-pay | Admitting: *Deleted

## 2020-02-01 ENCOUNTER — Encounter: Payer: Self-pay | Admitting: Family Medicine

## 2020-02-01 ENCOUNTER — Ambulatory Visit (INDEPENDENT_AMBULATORY_CARE_PROVIDER_SITE_OTHER): Payer: BC Managed Care – PPO | Admitting: Family Medicine

## 2020-02-01 ENCOUNTER — Other Ambulatory Visit: Payer: Self-pay

## 2020-02-01 VITALS — BP 120/78 | HR 68 | Ht 69.0 in | Wt 273.0 lb

## 2020-02-01 DIAGNOSIS — M5136 Other intervertebral disc degeneration, lumbar region: Secondary | ICD-10-CM

## 2020-02-01 DIAGNOSIS — M999 Biomechanical lesion, unspecified: Secondary | ICD-10-CM | POA: Diagnosis not present

## 2020-02-01 MED ORDER — PREDNISONE 50 MG PO TABS: ORAL_TABLET | ORAL | 0 refills | Status: DC

## 2020-02-01 NOTE — Telephone Encounter (Signed)
Pt called stating he saw Dr. Tamala Julian earlier today about his back and forgot to show Dr. Tamala Julian what he believes is a fx finger. Pt would like to know what Dr. Tamala Julian recommends for him to do.

## 2020-02-01 NOTE — Telephone Encounter (Signed)
Without seeing it the only thing would be to give it time and maybe buddy tape it for comfort for a week or so.  Otherwise if needed willing to see him if we get a cancellation

## 2020-02-01 NOTE — Telephone Encounter (Signed)
Talked to patient. States he would like to be seen if there is a cancellation.

## 2020-02-01 NOTE — Patient Instructions (Addendum)
Good to see you  Enjoy [place] prednisone sent if needed Continue all the exercises  See me again in  2 months

## 2020-02-01 NOTE — Assessment & Plan Note (Signed)
Known degenerative disc disease.  Seems to be stable at this time but has had exacerbation fairly recently.  Patient given prednisone with him having to travel has taken anti-inflammatories previously as well as the gabapentin.  Discussed can start those medications again if needed.  Discussed posture and ergonomics.  Follow-up again 4-8 weeks

## 2020-02-11 ENCOUNTER — Other Ambulatory Visit: Payer: Self-pay

## 2020-02-11 ENCOUNTER — Ambulatory Visit (AMBULATORY_SURGERY_CENTER): Payer: Self-pay

## 2020-02-11 VITALS — Ht 69.0 in | Wt 272.0 lb

## 2020-02-11 DIAGNOSIS — Z86010 Personal history of colonic polyps: Secondary | ICD-10-CM

## 2020-02-11 NOTE — Progress Notes (Signed)
No egg or soy allergy known to patient  No issues with past sedation with any surgeries or procedures No intubation problems in the past  No FH of Malignant Hyperthermia No diet pills per patient No home 02 use per patient  No blood thinners per patient  Pt denies issues with constipation  No A fib or A flutter  EMMI video to pt or via [place] 19 guidelines implemented in [place] today with Pt and RN  Pt is fully vaccinated  for Covid   Due to the COVID-19 pandemic we are asking patients to follow certain guidelines.  Pt aware of COVID protocols and [facility] guidelines

## 2020-02-19 ENCOUNTER — Other Ambulatory Visit: Payer: Self-pay | Admitting: Family Medicine

## 2020-02-25 ENCOUNTER — Other Ambulatory Visit: Payer: Self-pay | Admitting: Family Medicine

## 2020-03-02 DIAGNOSIS — L218 Other seborrheic dermatitis: Secondary | ICD-10-CM | POA: Diagnosis not present

## 2020-03-02 DIAGNOSIS — B360 Pityriasis versicolor: Secondary | ICD-10-CM | POA: Diagnosis not present

## 2020-03-03 ENCOUNTER — Encounter: Payer: Self-pay | Admitting: Internal Medicine

## 2020-03-03 ENCOUNTER — Other Ambulatory Visit: Payer: Self-pay | Admitting: Family Medicine

## 2020-03-04 ENCOUNTER — Other Ambulatory Visit: Payer: Self-pay | Admitting: Family Medicine

## 2020-03-08 ENCOUNTER — Other Ambulatory Visit: Payer: Self-pay | Admitting: Family Medicine

## 2020-03-09 ENCOUNTER — Other Ambulatory Visit: Payer: Self-pay

## 2020-03-09 ENCOUNTER — Telehealth: Payer: Self-pay | Admitting: Family Medicine

## 2020-03-09 ENCOUNTER — Encounter: Payer: Self-pay | Admitting: Internal Medicine

## 2020-03-09 ENCOUNTER — Telehealth: Payer: Self-pay | Admitting: Internal Medicine

## 2020-03-09 ENCOUNTER — Other Ambulatory Visit: Payer: Self-pay | Admitting: Family Medicine

## 2020-03-09 NOTE — Telephone Encounter (Signed)
Inbound call from patient requesting a call back from the nurse please.  Sates he is having alot of anxiety for upcoming procedure tomorrow and is wanting to know if he can take his clonazepam medication.  Please advise.

## 2020-03-09 NOTE — Telephone Encounter (Signed)
Instructed to take meds by 11 an and instructed to take the Clonazepam by 11 am with no issues- pt is anxious about IV stick as he hates needles -

## 2020-03-09 NOTE — Telephone Encounter (Signed)
Pt rescheduled CPE due to inclement weather on Monday, however pt is needing clomid medication refill until his appt on 1/26. Pt states to please contact him if any additional questions

## 2020-03-09 NOTE — Telephone Encounter (Signed)
30 day supply given until r/s appt on 1/26. Patient was made aware, pharmacy verified.

## 2020-03-10 ENCOUNTER — Ambulatory Visit (AMBULATORY_SURGERY_CENTER): Payer: BC Managed Care – PPO | Admitting: Internal Medicine

## 2020-03-10 ENCOUNTER — Encounter: Payer: Self-pay | Admitting: Internal Medicine

## 2020-03-10 ENCOUNTER — Other Ambulatory Visit: Payer: Self-pay

## 2020-03-10 VITALS — BP 101/68 | HR 62 | Temp 97.5°F | Resp 14 | Ht 69.0 in | Wt 272.0 lb

## 2020-03-10 DIAGNOSIS — Z1211 Encounter for screening for malignant neoplasm of colon: Secondary | ICD-10-CM

## 2020-03-10 DIAGNOSIS — Z86010 Personal history of colonic polyps: Secondary | ICD-10-CM

## 2020-03-10 HISTORY — PX: COLONOSCOPY: SHX174

## 2020-03-10 MED ORDER — SODIUM CHLORIDE 0.9 % IV SOLN: 500.0000 mL | Freq: Once | INTRAVENOUS | Status: DC

## 2020-03-10 NOTE — Patient Instructions (Addendum)
I did not find any polyps today in fact everything looks normal.  Based upon these findings and prior history I think it is appropriate to repeat a routine colonoscopy in 10 years, 2032.  I appreciate the opportunity to care for you.  Loletha Carrow, MD, [facility]  Use Psoriasis cream exterior anal area to help with itching, per Dr Carlean Purl   YOU HAD AN ENDOSCOPIC PROCEDURE TODAY AT THE [facility]:   Refer to the procedure report that was given to you for any specific questions about what was found during the examination.  If the procedure report does not answer your questions, please call your gastroenterologist to clarify.  If you requested that your care partner not be given the details of your procedure findings, then the procedure report has been included in a sealed envelope for you to review at your convenience later.  YOU SHOULD EXPECT: Some feelings of bloating in the abdomen. Passage of more gas than usual.  Walking can help get rid of the air that was put into your GI tract during the procedure and reduce the bloating. If you had a lower endoscopy (such as a colonoscopy or flexible sigmoidoscopy) you may notice spotting of blood in your stool or on the toilet paper. If you underwent a bowel prep for your procedure, you may not have a normal bowel movement for a few days.  Please Note:  You might notice some irritation and congestion in your nose or some drainage.  This is from the oxygen used during your procedure.  There is no need for concern and it should clear up in a day or so.  SYMPTOMS TO REPORT IMMEDIATELY:   Following lower endoscopy (colonoscopy or flexible sigmoidoscopy):  Excessive amounts of blood in the stool  Significant tenderness or worsening of abdominal pains  Swelling of the abdomen that is new, acute  Fever of 100F or higher    For urgent or emergent issues, a gastroenterologist can be reached at any hour by calling [phone]. Do not use  MyChart messaging for urgent concerns.    DIET:  We do recommend a small meal at first, but then you may proceed to your regular diet.  Drink plenty of fluids but you should avoid alcoholic beverages for 24 hours.  ACTIVITY:  You should plan to take it easy for the rest of today and you should NOT DRIVE or use heavy machinery until tomorrow (because of the sedation medicines used during the test).    FOLLOW UP: Our staff will call the number listed on your records 48-72 hours following your procedure to check on you and address any questions or concerns that you may have regarding the information given to you following your procedure. If we do not reach you, we will leave a message.  We will attempt to reach you two times.  During this call, we will ask if you have developed any symptoms of COVID 19. If you develop any symptoms (ie: fever, flu-like symptoms, shortness of breath, cough etc.) before then, please call [phone].  If you test positive for Covid 19 in the 2 weeks post procedure, please call and report this information to [place].    If any biopsies were taken you will be contacted by phone or by letter within the next 1-3 weeks.  Please call us at [phone] if you have not heard about the biopsies in 3 weeks.    SIGNATURES/CONFIDENTIALITY: You and/or your care partner have signed paperwork  which will be entered into your electronic medical record.  These signatures attest to the fact that that the information above on your After Visit Summary has been reviewed and is understood.  Full responsibility of the confidentiality of this discharge information lies with you and/or your care-partner.

## 2020-03-10 NOTE — Progress Notes (Signed)
Medical history reviewed with no changes since PV. VS assessed by A.G

## 2020-03-10 NOTE — Progress Notes (Signed)
Report to PACU, RN, vss, BBS= Clear.  

## 2020-03-10 NOTE — Op Note (Signed)
[place] Patient Name: Tony [name] Procedure Date: 03/10/2020 1:43 PM MRN: [id number] Endoscopist: Gatha Mayer , MD Age: 45 Referring MD:  Date of Birth: 1975-04-22 Gender: Male Account #: [id number] Procedure:                Colonoscopy Indications:              High risk colon cancer surveillance: Personal                            history of sessile serrated colon polyp (less than                            10 mm in size) with no dysplasia, Last colonoscopy:                            2015 Medicines:                Propofol per Anesthesia, Monitored Anesthesia Care Procedure:                Pre-Anesthesia Assessment:                           - Prior to the procedure, a History and Physical                            was performed, and patient medications and                            allergies were reviewed. The patient's tolerance of                            previous anesthesia was also reviewed. The risks                            and benefits of the procedure and the sedation                            options and risks were discussed with the patient.                            All questions were answered, and informed consent                            was obtained. Prior Anticoagulants: The patient has                            taken no previous anticoagulant or antiplatelet                            agents. ASA Grade Assessment: III - A patient with                            severe systemic disease. After reviewing the risks  and benefits, the patient was deemed in                            satisfactory condition to undergo the procedure.                           After obtaining informed consent, the colonoscope                            was passed under direct vision. Throughout the                            procedure, the patient's blood pressure, pulse, and                            oxygen saturations were monitored  continuously. The                            Olympus CF-HQ190L [id number]) Colonoscope was                            introduced through the anus and advanced to the the                            cecum, identified by appendiceal orifice and                            ileocecal valve. The colonoscopy was performed                            without difficulty. The patient tolerated the                            procedure well. The quality of the bowel                            preparation was good. The ileocecal valve,                            appendiceal orifice, and rectum were photographed.                            The bowel preparation used was Miralax via split                            dose instruction. Scope In: 1:55:24 PM Scope Out: 2:05:51 PM Scope Withdrawal Time: 0 hours 8 minutes 52 seconds  Total Procedure Duration: 0 hours 10 minutes 27 seconds  Findings:                 The perianal exam findings include pink                            hypopigmentation - psoriasis.  The digital rectal exam was normal. Pertinent                            negatives include normal prostate (size, shape, and                            consistency).                           The colon (entire examined portion) appeared normal.                           No additional abnormalities were found on                            retroflexion. Complications:            No immediate complications. Estimated Blood Loss:     Estimated blood loss: none. Impression:               - Pink hypopigmentation - psoriasis found on                            perianal exam.                           - The entire examined colon is normal.                           - No specimens collected.                           - Personal history of colonic polyps. diminutive                            ssp 2015 Recommendation:           - Patient has a contact number available for                             emergencies. The signs and symptoms of potential                            delayed complications were discussed with the                            patient. Return to normal activities tomorrow.                            Written discharge instructions were provided to the                            patient.                           - Resume previous diet.                           -  Continue present medications.                           - Repeat colonoscopy in 10 years. Gatha Mayer, MD 03/10/2020 2:14:31 PM This report has been signed electronically.

## 2020-03-13 ENCOUNTER — Encounter: Payer: Self-pay | Admitting: Family Medicine

## 2020-03-14 ENCOUNTER — Telehealth: Payer: Self-pay

## 2020-03-14 NOTE — Telephone Encounter (Signed)
  Follow up Call-  Call back number 03/10/2020  Post procedure Call Back phone  # [phone]  Permission to leave phone message Yes  Some recent data might be hidden     Follow up call made. NALM

## 2020-03-15 ENCOUNTER — Telehealth: Payer: Self-pay | Admitting: *Deleted

## 2020-03-15 ENCOUNTER — Encounter: Payer: Self-pay | Admitting: Family Medicine

## 2020-03-15 NOTE — Telephone Encounter (Signed)
Pt is requesting a call back from a nurse, pt is wanting to know if his abdomen should feel tender. Pt is feeling "off" today and feeling fatigued and running a fever.

## 2020-03-15 NOTE — Telephone Encounter (Signed)
PT called stating that he was having some mid abdominal pain 1/10 and up to 5/10 when he "pushes on it." Hes also running a low grade fever and has a stuffy nose. I told him to check with primary doctor and that his could might need to get a COVID test. He had been trying to get a test with no luck. Told the patient to let us know if he does test positive for COVID. Will relay this information to Dr. Carlean Purl.

## 2020-03-15 NOTE — Progress Notes (Unsigned)
Virtual Visit via Video Note  I connected with pt on 03/16/20 at  8:00 AM EST by a video enabled telemedicine application and verified that I am speaking with the correct person using two identifiers.  Location patient: home, [place] Location provider:work or home office Persons participating in the virtual visit: patient, provider  I discussed the limitations of evaluation and management by telemedicine and the availability of in person appointments. The patient expressed understanding and agreed to proceed.  Telemedicine visit is a necessity given the COVID-19 restrictions in place at the current time.  HPI: 45 y/o WM being seen today for "stomach pain, fever, fatigue, slight runny nose". He got colonoscopy 6 days ago. Day following procedure he had some peri-umbilical tenderness.  No n/v/diarrhea or constip.  Mild runny nose--clear and thin---yesterday. Temp yesterday 99.1 and this got him concerned b/c he states his temp is usually 97.5. Some fatigue a couple days ago. Feeling better today: fatigue is gone, runny nose gone, temp 97.5, abd tenderness better. No cough or SOB.  He is eating/drinking normally.  ROS: See pertinent positives and negatives per HPI.  Past Medical History:  Diagnosis Date  . Abnormal weight gain 05/25/2015  . Alcohol abuse 2016/2017   Pt states he was self medicating his anxiety.  Quit 04/2015.  Minimal intake as of 05/2017 f/u.  Marland Kitchen Anal fissure   . Anxiety   . Hepatic steatosis 2015; 2016; 02/2016   +Alcohol has played a role.  CT abd showed fatty liver+ liver enzymes mildly elevated (chron viral hepatitis screening NEG).  Abd u/s 03/26/16 showed fatty liver, small GB polyps, o/w normal.  . History of kidney stones    [facility]  . HTN (hypertension)   . Hx of colonic polyp - ssp 02/11/2014   No polyps 02/2020->recall 10 yrs  . Hyperlipidemia    Statin started 05/2018  . Insomnia   . Obesity, Class II, BMI 35-39.9   . Psoriasis   . Secondary male  hypogonadism    clomiphene 06/2019: normalized on clomiphene 08/2019    Past Surgical History:  Procedure Laterality Date  . APPENDECTOMY    . COLONOSCOPY  03/10/2020   No polyps. Recall 2032.  Marland Kitchen COLONOSCOPY W/ BIOPSIES  02/03/14   Cecal polyp (sessile serrated polyp w/out dysplasia) and posterior anal fissure; repeat TCS 2020 per Dr. Carlean Purl  . HAND SURGERY Left 2001   broke left pinkie finger  . LAPAROSCOPIC APPENDECTOMY N/A 02/24/2015   Procedure: APPENDECTOMY LAPAROSCOPIC;  Surgeon: Alphonsa Overall, MD;  Location: [facility];  Service: General;  Laterality: N/A;  . right ankle     2014     Current Outpatient Medications:  .  carbamazepine (TEGRETOL) 100 MG chewable tablet, TAKE 1 TABLET BY MOUTH IN THE MORNING THEN TAKE 2 TABLETS BY MOUTH AT BEDTIME, Disp: , Rfl:  .  clomiPHENE (CLOMID) 50 MG tablet, TAKE ONE TABLET BY MOUTH DAILY, Disp: 30 tablet, Rfl: 0 .  clonazePAM (KLONOPIN) 1 MG tablet, TAKE ONE TABLET BY MOUTH TWICE A DAY AS NEEDED, Disp: 180 tablet, Rfl: 1 .  Eszopiclone 3 MG TABS, TAKE ONE TABLET BY MOUTH EVERY NIGHT AT BEDTIME AS NEEDED FOR INSOMNIA, Disp: 90 tablet, Rfl: 1 .  fluconazole (DIFLUCAN) 200 MG tablet, Take 200 mg by mouth once a week. Take 2 tablets by mouth weekly for 4 weeks, Disp: , Rfl:  .  fluticasone (CUTIVATE) 0.05 % cream, Apply to affected areas bid prn, Disp: 30 g, Rfl: 1 .  gabapentin (NEURONTIN)  100 MG capsule, Take 2 capsules (200 mg total) by mouth at bedtime., Disp: 180 capsule, Rfl: 3 .  lisinopril (ZESTRIL) 10 MG tablet, TAKE ONE TABLET BY MOUTH DAILY, Disp: 90 tablet, Rfl: 0 .  meloxicam (MOBIC) 15 MG tablet, Take 15 mg by mouth daily as needed for pain., Disp: , Rfl:  .  promethazine (PHENERGAN) 12.5 MG tablet, 1-2 TABS BY MOUTH EVERY 6 HRS AS NEEDED FOR NAUSEA/VOMITING, Disp: 30 tablet, Rfl: 11 .  propranolol (INDERAL) 10 MG tablet, Take 1 tablet (10 mg total) by mouth 3 (three) times daily. Rx'd by psych, Disp: 90 tablet, Rfl: 5 .  rosuvastatin  (CRESTOR) 5 MG tablet, Take 1 tablet (5 mg total) by mouth daily., Disp: 90 tablet, Rfl: 1 .  sertraline (ZOLOFT) 25 MG tablet, Take 12.5 mg by mouth daily., Disp: , Rfl:  .  Vitamin D, Ergocalciferol, (DRISDOL) 1.25 MG (50000 UNIT) CAPS capsule, TAKE ONE CAPSULE BY MOUTH EVERY 7 DAYS, Disp: 12 capsule, Rfl: 0  EXAM:  VITALS per patient if applicable:  Vitals with BMI 03/10/2020 03/10/2020 03/10/2020  Height - - -  Weight - - -  BMI - - -  Systolic 470 94 962  Diastolic 68 55 49  Pulse 62 58 63  Some encounter information is confidential and restricted. Go to Review Flowsheets activity to see all data.     GENERAL: alert, oriented, appears well and in no acute distress  HEENT: atraumatic, conjunttiva clear, no obvious abnormalities on inspection of external nose and ears  NECK: normal movements of the head and neck  LUNGS: on inspection no signs of respiratory distress, breathing rate appears normal, no obvious gross SOB, gasping or wheezing  CV: no obvious cyanosis  MS: moves all visible extremities without noticeable abnormality  PSYCH/NEURO: pleasant and cooperative, no obvious depression or anxiety, speech and thought processing grossly intact  LABS: none today    Chemistry      Component Value Date/Time   NA 136 09/09/2019 0923   K 4.1 09/09/2019 0923   CL 101 09/09/2019 0923   CO2 26 09/09/2019 0923   BUN 17 09/09/2019 0923   CREATININE 1.09 09/09/2019 0923      Component Value Date/Time   CALCIUM 9.1 09/09/2019 0923   ALKPHOS 67 09/09/2019 0923   AST 44 (H) 09/09/2019 0923   ALT 50 09/09/2019 0923   BILITOT 0.5 09/09/2019 0923     Lab Results  Component Value Date   WBC 5.2 09/09/2019   HGB 15.0 09/09/2019   HCT 44.3 09/09/2019   MCV 96.5 09/09/2019   PLT 164.0 09/09/2019   Lab Results  Component Value Date   HGBA1C 6.2 07/02/2019   ASSESSMENT AND PLAN:  Discussed the following assessment and plan:  Periumbilical pain, postprocedural  (colonoscopy): a few other subtle symptoms that were concerning for possible viral syndrome.  However, everything seems to be resolving and he feels pretty good today. Reassured.  No testing or meds indicated at this time. Signs/symptoms to call or return for were reviewed and pt expressed understanding.   I discussed the assessment and treatment plan with the patient. The patient was provided an opportunity to ask questions and all were answered. The patient agreed with the plan and demonstrated an understanding of the instructions.   F/u: has "routine" f/u already scheduled for 03/22/20  Signed:  Crissie Sickles, MD           03/16/2020

## 2020-03-16 ENCOUNTER — Telehealth (INDEPENDENT_AMBULATORY_CARE_PROVIDER_SITE_OTHER): Payer: BC Managed Care – PPO | Admitting: Family Medicine

## 2020-03-16 ENCOUNTER — Encounter: Payer: Self-pay | Admitting: Family Medicine

## 2020-03-16 DIAGNOSIS — R109 Unspecified abdominal pain: Secondary | ICD-10-CM | POA: Diagnosis not present

## 2020-03-21 ENCOUNTER — Other Ambulatory Visit: Payer: Self-pay

## 2020-03-21 ENCOUNTER — Other Ambulatory Visit: Payer: Self-pay | Admitting: Family Medicine

## 2020-03-21 NOTE — Telephone Encounter (Signed)
Requesting: clonazepam Contract: 07/01/19 UDS:09/09/19 Last Visit: 03/16/20 Next Visit:03/22/20 Last Refill:09/14/19(180,1)  Please Advise. Medication pending

## 2020-03-21 NOTE — Telephone Encounter (Signed)
Detailed message left advising refill sent, okay per [facility]

## 2020-03-22 ENCOUNTER — Ambulatory Visit (INDEPENDENT_AMBULATORY_CARE_PROVIDER_SITE_OTHER): Payer: BC Managed Care – PPO | Admitting: Family Medicine

## 2020-03-22 ENCOUNTER — Encounter: Payer: Self-pay | Admitting: Family Medicine

## 2020-03-22 VITALS — BP 112/73 | HR 61 | Temp 97.5°F | Resp 16 | Ht 69.5 in | Wt 276.2 lb

## 2020-03-22 DIAGNOSIS — Z0000 Encounter for general adult medical examination without abnormal findings: Secondary | ICD-10-CM

## 2020-03-22 DIAGNOSIS — F32A Depression, unspecified: Secondary | ICD-10-CM

## 2020-03-22 DIAGNOSIS — E119 Type 2 diabetes mellitus without complications: Secondary | ICD-10-CM

## 2020-03-22 DIAGNOSIS — I10 Essential (primary) hypertension: Secondary | ICD-10-CM

## 2020-03-22 DIAGNOSIS — E291 Testicular hypofunction: Secondary | ICD-10-CM

## 2020-03-22 DIAGNOSIS — E782 Mixed hyperlipidemia: Secondary | ICD-10-CM | POA: Diagnosis not present

## 2020-03-22 DIAGNOSIS — F419 Anxiety disorder, unspecified: Secondary | ICD-10-CM

## 2020-03-22 DIAGNOSIS — F5101 Primary insomnia: Secondary | ICD-10-CM

## 2020-03-22 LAB — CBC WITH DIFFERENTIAL/PLATELET
Basophils Absolute: 0 10*3/uL (ref 0.0–0.1)
Basophils Relative: 0.8 % (ref 0.0–3.0)
Eosinophils Absolute: 0.2 10*3/uL (ref 0.0–0.7)
Eosinophils Relative: 3.4 % (ref 0.0–5.0)
HCT: 46 % (ref 39.0–52.0)
Hemoglobin: 15.5 g/dL (ref 13.0–17.0)
Lymphocytes Relative: 39.6 % (ref 12.0–46.0)
Lymphs Abs: 2.3 10*3/uL (ref 0.7–4.0)
MCHC: 33.6 g/dL (ref 30.0–36.0)
MCV: 93.4 fl (ref 78.0–100.0)
Monocytes Absolute: 0.5 10*3/uL (ref 0.1–1.0)
Monocytes Relative: 8.1 % (ref 3.0–12.0)
Neutro Abs: 2.8 10*3/uL (ref 1.4–7.7)
Neutrophils Relative %: 48.1 % (ref 43.0–77.0)
Platelets: 141 10*3/uL — ABNORMAL LOW (ref 150.0–400.0)
RBC: 4.92 Mil/uL (ref 4.22–5.81)
RDW: 12.6 % (ref 11.5–15.5)
WBC: 5.8 10*3/uL (ref 4.0–10.5)

## 2020-03-22 LAB — HEMOGLOBIN A1C: Hgb A1c MFr Bld: 5.9 % (ref 4.6–6.5)

## 2020-03-22 LAB — LIPID PANEL
Cholesterol: 134 mg/dL (ref 0–200)
HDL: 34.5 mg/dL — ABNORMAL LOW (ref >39.00)
LDL Cholesterol: 67 mg/dL (ref 0–99)
NonHDL: 99.53
Total CHOL/HDL Ratio: 4
Triglycerides: 162 mg/dL — ABNORMAL HIGH (ref 0.0–149.0)
VLDL: 32.4 mg/dL (ref 0.0–40.0)

## 2020-03-22 LAB — COMPREHENSIVE METABOLIC PANEL
ALT: 29 U/L (ref 0–53)
AST: 27 U/L (ref 0–37)
Albumin: 4.4 g/dL (ref 3.5–5.2)
Alkaline Phosphatase: 59 U/L (ref 39–117)
BUN: 15 mg/dL (ref 6–23)
CO2: 27 mEq/L (ref 19–32)
Calcium: 9 mg/dL (ref 8.4–10.5)
Chloride: 104 mEq/L (ref 96–112)
Creatinine, Ser: 1.06 mg/dL (ref 0.40–1.50)
GFR: 85.88 mL/min (ref >60.00)
Glucose, Bld: 80 mg/dL (ref 70–99)
Potassium: 3.9 mEq/L (ref 3.5–5.1)
Sodium: 138 mEq/L (ref 135–145)
Total Bilirubin: 0.4 mg/dL (ref 0.2–1.2)
Total Protein: 7.1 g/dL (ref 6.0–8.3)

## 2020-03-22 LAB — TSH: TSH: 1.88 u[IU]/mL (ref 0.35–4.50)

## 2020-03-22 NOTE — Progress Notes (Signed)
Office Note 03/22/2020  CC:  Chief Complaint  Patient presents with  . Annual Exam    Pt is fasting    HPI:  Tony [name] is a 45 y.o. White male who is here for annual health maintenance exam and f/u diet controlled DM, HLD, HTN, male hypogonadism. A/P as of last visit: "1) HLD: tolerating recent start of statin. FLP and hepatic panel today.  2) HTN: The current medical regimen is effective;  continue present plan and medications. Lytes/cr today.  3) Secondary hypogonadism: tolerating clomiphene so far but not feeling any therapeutic effect yet.  Recheck testost and LH today as well as monitor cbc.  4) Seb derm of face: rf'd cutivate 0.05% cream that he uses very sparingly and it has been very effective.  5) Anxiety and mood d/o: all stable. No changes in meds today. CSC UTD. Will do UDS today--clonaz should be present."  INTERIM HX: Says he feels great.  However, he has had progressive difficulty getting erections for a couple years, has low libido. No change in libido or erectile function with getting testost level up with clomid 2021. Trial of viagra-like med in remote past made him feel jittery and "very bad" but he can't be more specific.  DM: rare home gluc check-typically around 95. Diet and exercise habits very sporadic bad/good, wt fluctuates. Feet: no burning, tingling, or numbness.  HTN: no home bp monitoring.  HLD: tolerating statin.  Hypogonadism: taking clomiphene but not feeling any diff on it he says now.  Anxiety/mood stable.  Says use of eszopiclone recently caused him to feel "worse" and kept him up--has trouble being more specific.  Says he has ambien at home to try. PMP AWARE reviewed today: most recent rx for clonazepam was filled 12/11/19, # 180, rx by me. Most recent eszopiclone rx filled 02/11/20, #90, rx by me. No red flags.    Past Medical History:  Diagnosis Date  . Abnormal weight gain 05/25/2015  . Alcohol abuse 2016/2017    Pt states he was self medicating his anxiety.  Quit 04/2015.  Minimal intake as of 05/2017 f/u.  Marland Kitchen Anal fissure   . Anxiety   . Hepatic steatosis 2015; 2016; 02/2016   +Alcohol has played a role.  CT abd showed fatty liver+ liver enzymes mildly elevated (chron viral hepatitis screening NEG).  Abd u/s 03/26/16 showed fatty liver, small GB polyps, o/w normal.  . History of kidney stones    [facility]  . HTN (hypertension)   . Hx of colonic polyp - ssp 02/11/2014   No polyps 02/2020->recall 10 yrs  . Hyperlipidemia    Statin started 05/2018  . Insomnia   . Obesity, Class II, BMI 35-39.9   . Psoriasis   . Secondary male hypogonadism    clomiphene 06/2019: normalized on clomiphene 08/2019    Past Surgical History:  Procedure Laterality Date  . APPENDECTOMY    . COLONOSCOPY  03/10/2020   No polyps. Recall 2032.  Marland Kitchen COLONOSCOPY W/ BIOPSIES  02/03/14   Cecal polyp (sessile serrated polyp w/out dysplasia) and posterior anal fissure; repeat TCS 2020 per Dr. Carlean Purl  . HAND SURGERY Left 2001   broke left pinkie finger  . LAPAROSCOPIC APPENDECTOMY N/A 02/24/2015   Procedure: APPENDECTOMY LAPAROSCOPIC;  Surgeon: Alphonsa Overall, MD;  Location: [facility];  Service: General;  Laterality: N/A;  . right ankle     2014    Family History  Problem Relation Age of Onset  . Diabetes Mother   .  Restless legs syndrome Mother   . Heart disease Mother   . Heart attack Mother 62  . Psoriasis Father   . Stroke Neg Hx   . Cancer Neg Hx   . Colon cancer Neg Hx   . Colon polyps Neg Hx   . Esophageal cancer Neg Hx   . Rectal cancer Neg Hx   . Stomach cancer Neg Hx     Social History   Socioeconomic History  . Marital status: Married    Spouse name: Not on file  . Number of children: Not on file  . Years of education: Not on file  . Highest education level: Not on file  Occupational History  . Occupation: Secondary school teacher  Tobacco Use  . Smoking status: Never Smoker  . Smokeless tobacco: Never  Used  Vaping Use  . Vaping Use: Never used  Substance and Sexual Activity  . Alcohol use: Not Currently    Alcohol/week: 0.0 standard drinks    Comment: 2019 stopped alcohol  . Drug use: No  . Sexual activity: Not on file  Other Topics Concern  . Not on file  Social History Narrative   Married (spring 2019), no children.   Orig from [place].   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   No tobacco.   Alcohol: hx of "heavy drinking".  Cut back as of 04/2015.   Social Determinants of Health   Financial Resource Strain: Not on file  Food Insecurity: Not on file  Transportation Needs: Not on file  Physical Activity: Not on file  Stress: Not on file  Social Connections: Not on file  Intimate Partner Violence: Not on file    Outpatient Medications Prior to Visit  Medication Sig Dispense Refill  . carbamazepine (TEGRETOL) 100 MG chewable tablet TAKE 1 TABLET BY MOUTH IN THE MORNING THEN TAKE 2 TABLETS BY MOUTH AT BEDTIME    . clonazePAM (KLONOPIN) 1 MG tablet TAKE ONE TABLET BY MOUTH TWICE A DAY AS NEEDED 180 tablet 1  . fluconazole (DIFLUCAN) 200 MG tablet Take 200 mg by mouth once a week. Take 2 tablets by mouth weekly for 4 weeks    . fluticasone (CUTIVATE) 0.05 % cream Apply to affected areas bid prn 30 g 1  . gabapentin (NEURONTIN) 100 MG capsule Take 2 capsules (200 mg total) by mouth at bedtime. 180 capsule 3  . lisinopril (ZESTRIL) 10 MG tablet TAKE ONE TABLET BY MOUTH DAILY 90 tablet 0  . meloxicam (MOBIC) 15 MG tablet Take 15 mg by mouth daily as needed for pain.    Marland Kitchen propranolol (INDERAL) 10 MG tablet Take 1 tablet (10 mg total) by mouth 3 (three) times daily. Rx'd by psych 90 tablet 5  . rosuvastatin (CRESTOR) 5 MG tablet Take 1 tablet (5 mg total) by mouth daily. 90 tablet 1  . sertraline (ZOLOFT) 25 MG tablet Take 12.5 mg by mouth daily.    . Vitamin D, Ergocalciferol, (DRISDOL) 1.25 MG (50000 UNIT) CAPS capsule TAKE ONE CAPSULE BY MOUTH EVERY 7 DAYS 12 capsule 0  . clomiPHENE (CLOMID)  50 MG tablet TAKE ONE TABLET BY MOUTH DAILY 30 tablet 0  . Eszopiclone 3 MG TABS TAKE ONE TABLET BY MOUTH EVERY NIGHT AT BEDTIME AS NEEDED FOR INSOMNIA 90 tablet 1  . promethazine (PHENERGAN) 12.5 MG tablet 1-2 TABS BY MOUTH EVERY 6 HRS AS NEEDED FOR NAUSEA/VOMITING (Patient not taking: Reported on 03/22/2020) 30 tablet 11   No facility-administered medications prior to visit.    No Known Allergies  ROS Review of Systems  Constitutional: Negative for appetite change, chills, fatigue and fever.  HENT: Negative for congestion, dental problem, ear pain and sore throat.   Eyes: Negative for discharge, redness and visual disturbance.  Respiratory: Negative for cough, chest tightness, shortness of breath and wheezing.   Cardiovascular: Negative for chest pain, palpitations and leg swelling.  Gastrointestinal: Negative for abdominal pain, blood in stool, diarrhea, nausea and vomiting.  Genitourinary: Negative for difficulty urinating, dysuria, flank pain, frequency, hematuria and urgency.  Musculoskeletal: Negative for arthralgias, back pain, joint swelling, myalgias and neck stiffness.  Skin: Negative for pallor and rash.  Neurological: Negative for dizziness, speech difficulty, weakness and headaches.  Hematological: Negative for adenopathy. Does not bruise/bleed easily.  Psychiatric/Behavioral: Negative for confusion and sleep disturbance. The patient is not nervous/anxious.    PE; Vitals with BMI 03/22/2020 03/10/2020 03/10/2020  Height 5' 9.5" - -  Weight 276 lbs 3 oz - -  BMI 16.10 - -  Systolic 960 454 94  Diastolic 73 68 55  Pulse 61 62 58  Some encounter information is confidential and restricted. Go to Review Flowsheets activity to see all data.   Gen: Alert, well appearing.  Patient is oriented to person, place, time, and situation. AFFECT: pleasant, lucid thought and speech. ENT: Ears: EACs clear, normal epithelium.  TMs with good light reflex and landmarks bilaterally.  Eyes: no  injection, icteris, swelling, or exudate.  EOMI, PERRLA. Nose: no drainage or turbinate edema/swelling.  No injection or focal lesion.  Mouth: lips without lesion/swelling.  Oral mucosa pink and moist.  Dentition intact and without obvious caries or gingival swelling.  Oropharynx without erythema, exudate, or swelling.  Neck: supple/nontender.  No LAD, mass, or TM.  Carotid pulses 2+ bilaterally, without bruits. CV: RRR, no m/r/g.   LUNGS: CTA bilat, nonlabored resps, good aeration in all lung fields. ABD: soft, NT, ND, BS normal.  No hepatospenomegaly or mass.  No bruits. EXT: no clubbing, cyanosis, or edema.  Musculoskeletal: no joint swelling, erythema, warmth, or tenderness.  ROM of all joints intact. Skin - no sores or suspicious lesions or rashes or color changes   Pertinent labs:  Lab Results  Component Value Date   TSH 1.88 03/22/2020   Lab Results  Component Value Date   WBC 5.8 03/22/2020   HGB 15.5 03/22/2020   HCT 46.0 03/22/2020   MCV 93.4 03/22/2020   PLT 141.0 (L) 03/22/2020   Lab Results  Component Value Date   CREATININE 1.06 03/22/2020   BUN 15 03/22/2020   NA 138 03/22/2020   K 3.9 03/22/2020   CL 104 03/22/2020   CO2 27 03/22/2020   Lab Results  Component Value Date   ALT 29 03/22/2020   AST 27 03/22/2020   ALKPHOS 59 03/22/2020   BILITOT 0.4 03/22/2020   Lab Results  Component Value Date   CHOL 134 03/22/2020   Lab Results  Component Value Date   HDL 34.50 (L) 03/22/2020   Lab Results  Component Value Date   LDLCALC 67 03/22/2020   Lab Results  Component Value Date   TRIG 162.0 (H) 03/22/2020   Lab Results  Component Value Date   CHOLHDL 4 03/22/2020   Lab Results  Component Value Date   HGBA1C 5.9 03/22/2020   Lab Results  Component Value Date   TESTOSTERONE 526.92 09/09/2019    ASSESSMENT AND PLAN:   1) DM, diet controlled. Hard to tell if his diet is low carb. Hba1c, lytes/cr today.  2) Morbid obesity (BMI  40): Exercise level not optimal. He struggles with knowing what kind of diet/exercise plan to try but admits he has trouble sticking with whatever plan he does try.   Discussed options today and decided to refer to nonsurgical bariatric clinic[facility], Dr. Dennard Nip.  3) HTN: stable. Cont lisin 75m qd. Lytes/cr today.  4) HLD: tolerating statin. FLP and hepatic panel today.  5) Secondary hypogonadism: testost normalized, LH went up after pt got on clomid BUT poor libido has persisted. Stop clomid. Wt loss emphasized as what is ultimately needed to help this.  6) ED, suspect multifactorial (psychogenic, vascular, hypogonadism, less likely med side effect). Wt loss recommended.  7) Insomnia: paradoxical rxn recently to eszopiclone. Stop this med. He has ambien at home to try and if helpful he'll let me know and I'll do rx.  8) Anx and dep: stable on 1/2 of 248msertraline qd and clonaz bid prn.  No changes. CSC UTD. Will do UDS at next f/u visit.  9) Health maintenance exam: Reviewed age and gender appropriate health maintenance issues (prudent diet, regular exercise, health risks of tobacco and excessive alcohol, use of seatbelts, fire alarms in home, use of sunscreen).  Also reviewed age and gender appropriate health screening as well as vaccine recommendations. Vaccines: ALL UTD. Labs: fasting HP + a1c today. Prostate ca screening: average risk patient= as per latest guidelines, start screening at 5073rs of age. Colon ca screening: recall 2032.  An After Visit Summary was printed and given to the patient.  FOLLOW UP:  Return in about 3 months (around 06/20/2020) for routine chronic illness f/u.  Signed:  PhCrissie SicklesMD           03/22/2020

## 2020-03-27 ENCOUNTER — Telehealth: Payer: Self-pay

## 2020-03-27 NOTE — Telephone Encounter (Signed)
Patient returning call.  He could not understand what was left on his voicemail. Please call [phone]

## 2020-03-28 NOTE — Telephone Encounter (Signed)
Spoke with patient and went over lab results again. Provided office number for [facility] and advised he could call at the beginning of next week, if not contacted by the end of this week.

## 2020-03-29 DIAGNOSIS — F411 Generalized anxiety disorder: Secondary | ICD-10-CM | POA: Diagnosis not present

## 2020-03-29 DIAGNOSIS — F410 Panic disorder [episodic paroxysmal anxiety] without agoraphobia: Secondary | ICD-10-CM | POA: Diagnosis not present

## 2020-03-31 ENCOUNTER — Other Ambulatory Visit: Payer: Self-pay | Admitting: Family Medicine

## 2020-04-04 NOTE — Progress Notes (Deleted)
[facility] [facility] Phone: [phone] Subjective:    I'm seeing this patient by the request  of:  McGowen, Adrian Blackwater, MD  CC: Low back pain follow-up  [id number]  Tony [name] is a 45 y.o. male coming in with complaint of back pain. OMT 02/01/2020. Patient states   Medications patient has been prescribed: Gabapentin, Vit D  Taking:         Reviewed prior external information including notes and imaging from previsou exam, outside providers and external EMR if available.   As well as notes that were available from care everywhere and other healthcare systems.  Past medical history, social, surgical and family history all reviewed in electronic medical record.  No pertanent information unless stated regarding to the chief complaint.   Past Medical History:  Diagnosis Date  . Abnormal weight gain 05/25/2015  . Alcohol abuse 2016/2017   Pt states he was self medicating his anxiety.  Quit 04/2015.  Minimal intake as of 05/2017 f/u.  Marland Kitchen Anal fissure   . Anxiety   . Hepatic steatosis 2015; 2016; 02/2016   +Alcohol has played a role.  CT abd showed fatty liver+ liver enzymes mildly elevated (chron viral hepatitis screening NEG).  Abd u/s 03/26/16 showed fatty liver, small GB polyps, o/w normal.  . History of kidney stones    [facility]  . HTN (hypertension)   . Hx of colonic polyp - ssp 02/11/2014   No polyps 02/2020->recall 10 yrs  . Hyperlipidemia    Statin started 05/2018  . Insomnia   . Obesity, Class II, BMI 35-39.9   . Psoriasis   . Secondary male hypogonadism    clomiphene 06/2019: normalized on clomiphene 08/2019    No Known Allergies   Review of Systems:  No headache, visual changes, nausea, vomiting, diarrhea, constipation, dizziness, abdominal pain, skin rash, fevers, chills, night sweats, weight loss, swollen lymph nodes, body aches, joint swelling, chest pain, shortness of breath, mood changes.  POSITIVE muscle aches  Objective  There were no vitals taken for this visit.   General: No apparent distress alert and oriented x3 mood and affect normal, dressed appropriately.  HEENT: Pupils equal, extraocular movements intact  Respiratory: Patient's speak in full sentences and does not appear short of breath  Cardiovascular: No lower extremity edema, non tender, no erythema  Neuro: Cranial nerves II through XII are intact, neurovascularly intact in all extremities with 2+ DTRs and 2+ pulses.  Gait normal with good balance and coordination.  MSK:  Non tender with full range of motion and good stability and symmetric strength and tone of shoulders, elbows, wrist, hip, knee and ankles bilaterally.  Back -low back exam does have some loss of lordosis.  Some tenderness over the paraspinal musculature of the lumbar spine right greater than left.  Tightness noted with FABER test right greater than left as well.  Negative straight leg test.  Osteopathic findings  T9 extended rotated and side bent left L2 flexed rotated and side bent right Sacrum right on right       Assessment and Plan:    Nonallopathic problems  Decision today to treat with OMT was based on Physical Exam  After verbal consent patient was treated with HVLA, ME, FPR techniques in cervical, rib, thoracic, lumbar, and sacral  areas  Patient tolerated the procedure well with improvement in symptoms  Patient given exercises, stretches and lifestyle modifications  See medications in patient instructions if  given  Patient will follow up in 4-8 weeks      The above documentation has been reviewed and is accurate and complete Jacqualin Combes       Note: This dictation was prepared with Dragon dictation along with smaller phrase technology. Any transcriptional errors that result from this process are unintentional.

## 2020-04-05 ENCOUNTER — Ambulatory Visit (INDEPENDENT_AMBULATORY_CARE_PROVIDER_SITE_OTHER): Payer: BC Managed Care – PPO

## 2020-04-05 ENCOUNTER — Other Ambulatory Visit: Payer: Self-pay

## 2020-04-05 ENCOUNTER — Ambulatory Visit: Payer: BC Managed Care – PPO | Admitting: Family Medicine

## 2020-04-05 ENCOUNTER — Encounter: Payer: Self-pay | Admitting: Family Medicine

## 2020-04-05 ENCOUNTER — Ambulatory Visit: Payer: Self-pay | Admitting: Family Medicine

## 2020-04-05 VITALS — BP 130/90 | HR 73 | Ht 69.5 in | Wt 272.0 lb

## 2020-04-05 DIAGNOSIS — M79642 Pain in left hand: Secondary | ICD-10-CM

## 2020-04-05 DIAGNOSIS — M51369 Other intervertebral disc degeneration, lumbar region without mention of lumbar back pain or lower extremity pain: Secondary | ICD-10-CM

## 2020-04-05 DIAGNOSIS — M5136 Other intervertebral disc degeneration, lumbar region: Secondary | ICD-10-CM | POA: Diagnosis not present

## 2020-04-05 DIAGNOSIS — M79645 Pain in left finger(s): Secondary | ICD-10-CM | POA: Diagnosis not present

## 2020-04-05 DIAGNOSIS — M999 Biomechanical lesion, unspecified: Secondary | ICD-10-CM

## 2020-04-05 NOTE — Progress Notes (Signed)
[facility] [address] [place] Phone: [phone] Subjective:   I [name] am serving as a Education administrator for Dr. Hulan Saas.  This visit occurred during the SARS-CoV-2 public health emergency.  Safety protocols were in place, including screening questions prior to the visit, additional usage of staff PPE, and extensive cleaning of exam room while observing appropriate contact time as indicated for disinfecting solutions.   I'm seeing this patient by the request  of:  McGowen, Adrian Blackwater, MD  CC: Low back pain follow-up  [id number]  Tony [name] is a 45 y.o. male coming in with complaint of back pain. OMT 02/01/2020. Patient states he is doing well. Pain with certain activities but the pain is not what it used to be. Wants to talk about left pinky. Was replacing a ceiling fan. Left it by his bed. Fell asleep and work up forgetting the fan was there and he tripped over it. Abduction is painful. ROM is better. Injury is chronic.   Medications patient has been prescribed: Gabapentin, Vit D           Reviewed prior external information including notes and imaging from previsou exam, outside providers and external EMR if available.   As well as notes that were available from care everywhere and other healthcare systems.  Past medical history, social, surgical and family history all reviewed in electronic medical record.  No pertanent information unless stated regarding to the chief complaint.   Past Medical History:  Diagnosis Date  . Abnormal weight gain 05/25/2015  . Alcohol abuse 2016/2017   Pt states he was self medicating his anxiety.  Quit 04/2015.  Minimal intake as of 05/2017 f/u.  Marland Kitchen Anal fissure   . Anxiety   . Hepatic steatosis 2015; 2016; 02/2016   +Alcohol has played a role.  CT abd showed fatty liver+ liver enzymes mildly elevated (chron viral hepatitis screening NEG).  Abd u/s 03/26/16 showed fatty liver, small GB  polyps, o/w normal.  . History of kidney stones    [facility]  . HTN (hypertension)   . Hx of colonic polyp - ssp 02/11/2014   No polyps 02/2020->recall 10 yrs  . Hyperlipidemia    Statin started 05/2018  . Insomnia   . Obesity, Class II, BMI 35-39.9   . Psoriasis   . Secondary male hypogonadism    clomiphene 06/2019: normalized on clomiphene 08/2019    No Known Allergies   Review of Systems:  No headache, visual changes, nausea, vomiting, diarrhea, constipation, dizziness, abdominal pain, skin rash, fevers, chills, night sweats, weight loss, swollen lymph nodes, body aches, joint swelling, chest pain, shortness of breath, mood changes. POSITIVE muscle aches  Objective  Blood pressure 130/90, pulse 73, height 5' 9.5" (1.765 m), weight 272 lb (123.4 kg), SpO2 97 %.   General: No apparent distress alert and oriented x3 mood and affect normal, dressed appropriately.  HEENT: Pupils equal, extraocular movements intact  Respiratory: Patient's speak in full sentences and does not appear short of breath  Cardiovascular: No lower extremity edema, non tender, no erythema  Gait normal with good balance and coordination.  MSK:  Non tender with full range of motion and good stability and symmetric strength and tone of shoulders, elbows, wrist, hip, knee and ankles bilaterally.  Left hand exam shows the patient's pinky finger does have good range of motion.  Patient does have postsurgical changes noted on the dorsal aspect of the hand.  Patient is  nontender on exam except for ulnar deviation at the PIP.  No significant laxity though noted no inflammation noted. Back -low back exam does have some loss of lordosis.  Some tenderness over the paraspinal musculature of the lumbar spine right greater than left.  Tightness noted with FABER test right greater than left as well.  Negative straight leg test.  Osteopathic findings  T9 extended rotated and side bent left L2 flexed rotated and side bent  right Sacrum right on right       Assessment and Plan:  Degenerative disc disease, lumbar Doing better at baseline.  Still has some mild discomfort here and there.  Does have the meloxicam for breakthrough.  Patient has responded well to the vitamin D.  Discussed home exercises.  Patient is to increase activity including weightlifting but if any worsening pain to call us but I do believe patient should do relatively well.  Follow-up again in 2 to 3 months.  Has responded well to manipulation.  Pain in finger of left hand Mild discomfort in the left pinky finger.  No angulation noted.  Patient will has had surgical intervention on this finger previously many years ago.  We discussed buddy taping for short course.  X-rays ordered today to further evaluate.  Follow-up again worsening pain    Nonallopathic problems  Decision today to treat with OMT was based on Physical Exam  After verbal consent patient was treated with HVLA, ME, FPR techniques in  thoracic, lumbar, and sacral  areas  Patient tolerated the procedure well with improvement in symptoms  Patient given exercises, stretches and lifestyle modifications  See medications in patient instructions if given  Patient will follow up in 4-8 weeks      The above documentation has been reviewed and is accurate and complete Lyndal Pulley, DO       Note: This dictation was prepared with Dragon dictation along with smaller phrase technology. Any transcriptional errors that result from this process are unintentional.

## 2020-04-05 NOTE — Assessment & Plan Note (Signed)
Doing better at baseline.  Still has some mild discomfort here and there.  Does have the meloxicam for breakthrough.  Patient has responded well to the vitamin D.  Discussed home exercises.  Patient is to increase activity including weightlifting but if any worsening pain to call us but I do believe patient should do relatively well.  Follow-up again in 2 to 3 months.  Has responded well to manipulation.

## 2020-04-05 NOTE — Patient Instructions (Addendum)
Good to see you Hand xray today [place] to buddy tape the finger for 10 days Back doing great See me again in 3 months

## 2020-04-05 NOTE — Assessment & Plan Note (Signed)
Mild discomfort in the left pinky finger.  No angulation noted.  Patient will has had surgical intervention on this finger previously many years ago.  We discussed buddy taping for short course.  X-rays ordered today to further evaluate.  Follow-up again worsening pain

## 2020-04-13 ENCOUNTER — Encounter (INDEPENDENT_AMBULATORY_CARE_PROVIDER_SITE_OTHER): Payer: Self-pay | Admitting: Family Medicine

## 2020-04-13 ENCOUNTER — Other Ambulatory Visit: Payer: Self-pay

## 2020-04-13 ENCOUNTER — Ambulatory Visit (INDEPENDENT_AMBULATORY_CARE_PROVIDER_SITE_OTHER): Payer: BC Managed Care – PPO | Admitting: Family Medicine

## 2020-04-13 VITALS — BP 115/71 | HR 70 | Temp 98.1°F | Ht 69.0 in | Wt 270.0 lb

## 2020-04-13 DIAGNOSIS — R0602 Shortness of breath: Secondary | ICD-10-CM | POA: Diagnosis not present

## 2020-04-13 DIAGNOSIS — I10 Essential (primary) hypertension: Secondary | ICD-10-CM | POA: Diagnosis not present

## 2020-04-13 DIAGNOSIS — Z9189 Other specified personal risk factors, not elsewhere classified: Secondary | ICD-10-CM | POA: Diagnosis not present

## 2020-04-13 DIAGNOSIS — Z6841 Body Mass Index (BMI) 40.0 and over, adult: Secondary | ICD-10-CM

## 2020-04-13 DIAGNOSIS — E782 Mixed hyperlipidemia: Secondary | ICD-10-CM

## 2020-04-13 DIAGNOSIS — Z0289 Encounter for other administrative examinations: Secondary | ICD-10-CM

## 2020-04-13 DIAGNOSIS — E119 Type 2 diabetes mellitus without complications: Secondary | ICD-10-CM | POA: Insufficient documentation

## 2020-04-13 DIAGNOSIS — Z1331 Encounter for screening for depression: Secondary | ICD-10-CM | POA: Insufficient documentation

## 2020-04-13 DIAGNOSIS — R5383 Other fatigue: Secondary | ICD-10-CM | POA: Insufficient documentation

## 2020-04-13 DIAGNOSIS — E559 Vitamin D deficiency, unspecified: Secondary | ICD-10-CM

## 2020-04-13 DIAGNOSIS — E1169 Type 2 diabetes mellitus with other specified complication: Secondary | ICD-10-CM

## 2020-04-13 DIAGNOSIS — F418 Other specified anxiety disorders: Secondary | ICD-10-CM | POA: Diagnosis not present

## 2020-04-13 NOTE — Progress Notes (Signed)
Office: [phone]  /  Fax: [phone]    Date: April 19, 2020   Appointment Start Time: 10:59am Duration: 56 minutes Provider: Glennie [name], Psy.D. Type of Session: Intake for Individual Therapy  Location of Patient: Home Location of Provider: Provider's Home (private office) Type of Contact: Telepsychological Visit via MyChart Video Visit  Informed Consent: Prior to proceeding with today's appointment, two pieces of identifying information were obtained. In addition, Tony Tony [name]'s physical location at the time of this appointment was obtained as well a phone number he could be reached at in the event of technical difficulties. Tony Tony [name] and this provider participated in today's telepsychological service.   The provider's role was explained to Tony Tony [name]. The provider reviewed and discussed issues of confidentiality, privacy, and limits therein (e.g., reporting obligations). In addition to verbal informed consent, written informed consent for psychological services was obtained prior to the initial appointment. Since the clinic is not a 24/7 crisis center, mental health emergency resources were shared and this  provider explained MyChart, e-mail, voicemail, and/or other messaging systems should be utilized only for non-emergency reasons. This provider also explained that information obtained during appointments will be placed in Tony Tony [name]'s medical record and relevant information will be shared with other providers at [facility] for coordination of care. Tony Tony [name] agreed information may be shared with other [facility] providers as needed for coordination of care, and by signing the service agreement document, Tony Tony [name] provided written consent for coordination of care. Prior to initiating telepsychological services, Tony Tony [name] completed an informed consent document, which included the development of a safety plan (i.e., an emergency contact and emergency resources) in the event  of an emergency/crisis. Tony Tony [name] expressed understanding of the rationale of the safety plan. Tony Tony [name] verbally acknowledged understanding he is ultimately responsible for understanding his insurance benefits for telepsychological and in-person services. This provider also reviewed confidentiality, as it relates to telepsychological services, as well as the rationale for telepsychological services (i.e., to reduce exposure risk to COVID-19). Tony Tony [name]  acknowledged understanding that appointments cannot be recorded without both party consent and he is aware he is responsible for securing confidentiality on his end of the session. Tony Tony [name] verbally consented to proceed.  Chief Complaint/HPI: Tony Tony [name] was referred by Dr. Dennard [name] on April 13, 2020. Tony Tony [name] Food and Mood (modified PHQ-9) score on April 13, 2020 was 20.  During today's appointment, Tony Tony [name] indicated he was unsure why he was referred to this provider; however, verbally consented to proceed. He was verbally administered a questionnaire assessing various behaviors related to emotional eating. Tony Tony [name] endorsed the following: experience food cravings on a regular basis, find food is comforting to you and not worry about what you eat when you are in a Tony [name] mood. He shared he used to crave peanut butter. Regarding the prescribed meal plan, he indicated, "It wasn't that difficult during the first couple days." However, he described challenges with the quantity of the food and the "particular foods." In addition, Tony Tony [name] denied a history of binge eating. Tony Tony [name] denied a history of restricting food intake, purging and engagement in other compensatory strategies for weight loss, and has never been diagnosed with an eating disorder. He also denied a history of treatment for emotional eating. Furthermore, Tony Tony [name] endorsed sleep difficulties recently, noting  he is having a hard time going to sleep.  Mental Status Examination:  Appearance: well groomed and  appropriate hygiene  Behavior: appropriate to circumstances Mood: euthymic Affect: mood congruent Speech: normal in rate, volume, and tone Eye  Contact: appropriate Psychomotor Activity: unable to assess  Gait: unable to assess Thought Process: linear, logical, and goal directed  Thought Content/Perception: denies suicidal and homicidal ideation, plan, and intent and no hallucinations, delusions, bizarre thinking or behavior reported or observed Orientation: time, person, place, and purpose of appointment Memory/Concentration: memory, attention, language, and fund of knowledge intact  Insight/Judgment: Tony [name]  Family & Psychosocial History: Tony Tony [name] reported he is married and he does not have any children. He indicated he is currently employed in [employer]. Additionally, Tony Tony [name] shared his highest level of education obtained is a bachelor's degree. Currently, Tony Tony [name]'s social support system consists of his wife, mother, and father. Moreover, Tony Tony [name] stated he resides with his wife.   Medical History:  Past Medical History:  Diagnosis Date  . Abnormal weight gain 05/25/2015  . Alcohol abuse 2016/2017   Pt states he was self medicating his anxiety.  Quit 04/2015.  Minimal intake as of 05/2017 f/u.  Marland Kitchen Anal fissure   . Anxiety   . Anxiety   . Back pain   . Depression   . Fatty liver   . Hepatic steatosis 2015; 2016; 02/2016   +Alcohol has played a role.  CT abd showed fatty liver+ liver enzymes mildly elevated (chron viral hepatitis screening NEG).  Abd u/s 03/26/16 showed fatty liver, small GB polyps, Tony [name]/w normal.  . History of kidney stones    [facility]  . HTN (hypertension)   . Hx of colonic polyp - ssp 02/11/2014   No polyps 02/2020->recall 10 yrs  . Hyperlipidemia    Statin started 05/2018  . Insomnia   . Obesity, Class II, BMI 35-39.9   . Psoriasis   . Secondary male hypogonadism    clomiphene 06/2019: normalized on clomiphene 08/2019  . Shortness of breath on exertion     Past Surgical History:  Procedure Laterality Date  . APPENDECTOMY    . COLONOSCOPY  03/10/2020   No polyps. Recall 2032.  Marland Kitchen COLONOSCOPY W/ BIOPSIES  02/03/14   Cecal polyp (sessile serrated polyp w/out dysplasia) and posterior anal fissure; repeat TCS 2020 per Dr. Carlean Purl  . HAND SURGERY Left 2001   broke left pinkie finger  . LAPAROSCOPIC APPENDECTOMY N/A 02/24/2015   Procedure: APPENDECTOMY LAPAROSCOPIC;  Surgeon: Alphonsa Overall, MD;  Location: [facility];  Service: General;  Laterality: N/A;  . right ankle     2014   Current Outpatient Medications on File Prior to Visit  Medication Sig Dispense Refill  . carbamazepine (TEGRETOL) 100 MG chewable tablet TAKE 1 TABLET BY MOUTH IN THE MORNING THEN TAKE 2 TABLETS BY MOUTH AT BEDTIME    . clonazePAM (KLONOPIN) 1 MG tablet Take 1 mg by mouth 3 (three) times daily as needed for anxiety.    . fluticasone (CUTIVATE) 0.05 % cream Apply to affected areas bid prn 30 g 1  . gabapentin (NEURONTIN) 100 MG capsule Take 2 capsules (200 mg total) by mouth at bedtime. 180 capsule 3  . lisinopril (ZESTRIL) 10 MG tablet TAKE ONE TABLET BY MOUTH DAILY 90 tablet 0  . meloxicam (MOBIC) 15 MG tablet Take 15 mg by mouth daily as needed for pain.    Marland Kitchen propranolol (INDERAL) 10 MG tablet Take 10 mg by mouth 2 (two) times daily.    . rosuvastatin (CRESTOR) 5 MG tablet TAKE ONE TABLET BY MOUTH DAILY 90 tablet 1  . sertraline (ZOLOFT) 25 MG tablet Take 12.5 mg by mouth daily.    . Vitamin D, Ergocalciferol, (DRISDOL) 1.25 MG (50000  UNIT) CAPS capsule TAKE ONE CAPSULE BY MOUTH EVERY 7 DAYS 12 capsule 0   No current facility-administered medications on file prior to visit.  Danner denied a history of head injuries and loss of consciousness.    Mental Health History: Tony Tony [name] reported he attended therapeutic services with [name], Ph.D. in 2011 due to anxiety and panic attacks. He explained the aforementioned prevented him from running, noting he used to run 12-15  miles a day six times a week. Currently, Tony Tony [name] stated he meets with Tony Good, MSN, FNP-BC for medication management, adding she prescribes Inderal and Zoloft. He noted his PCP prescribes Klonopin. He denied any concerns/issues with medications. He stated he meets with Ms. Ronnald Ramp once every six months for medication management. Tony Tony [name] reported there is no history of hospitalizations for psychiatric concerns. Tony Tony [name] denied a family history of mental health related concerns. Maccoy reported there is no history of trauma including psychological, physical  and sexual abuse, as well as neglect.   Meshulem described his typical mood lately as "pretty Tony [name]." Aside from concerns noted above and endorsed on the PHQ-9 and GAD-7, Musab described experiencing decreased self-esteem and social withdrawal starting 10-12 years due to weight gain. He endorsed a history of panic attacks, noting his current medications are helpful. He recalled he last had a panic attack over two years ago. Jackston endorsed "occasional" alcohol use, noting he last consumed alcohol on New Years in the form of one "flute of champagne." He denied tobacco use. He denied illicit/recreational substance use. Furthermore, Tony Tony [name] indicated he is not experiencing the following: hallucinations and delusions, paranoia, symptoms of mania , crying spells and decreased motivation. He also denied history of and current suicidal ideation, plan, and intent; history of and current homicidal ideation, plan, and intent; and history of and current engagement in self-harm. Notably, Aldrick endorsed item 9 (i.e., "Do you feel that your weight problem is so hopeless that sometimes life doesn't seem worth living?") on the modified PHQ-9 during his initial appointment with Dr. Dennard [name] on April 13, 2020. Rannie explained he endorsed the item due to feeling stuck with his weight resulting in him seeking help. He denied endorsing the item due to suicidal ideation.   The  following strengths were reported by Tony Tony [name]: Tony [name] listener, focus on health, athletic, and dedicated. The following strengths were observed by this provider: ability to express thoughts and feelings during the therapeutic session, ability to establish and benefit from a therapeutic relationship, willingness to work toward established goal(s) with the clinic and ability to engage in reciprocal conversation.   Legal History: Zahir reported there is no history of legal involvement.   Structured Assessments Results: The Patient Health Questionnaire-9 (PHQ-9) is a self-report measure that assesses symptoms and severity of depression over the course of the last two weeks. Allante obtained a score of 10 suggesting moderate depression. Aristide finds the endorsed symptoms to be somewhat difficult. [0= Not at all; 1= Several days; 2= More than half the days; 3= Nearly every day] Little interest or pleasure in doing things 0  Feeling down, depressed, or hopeless 0  Trouble falling or staying asleep, or sleeping too much 2  Feeling tired or having little energy 3  Poor appetite or overeating 0  Feeling bad about yourself --- or that you are a failure or have let yourself or your family down 3  Trouble concentrating on things, such as reading the newspaper or watching television 2  Moving or speaking so slowly that other people  could have noticed? Or the opposite --- being so fidgety or restless that you have been moving around a lot more than usual 0  Thoughts that you would be better off dead or hurting yourself in some way 0  PHQ-9 Score 10    The Generalized Anxiety Disorder-7 (GAD-7) is a brief self-report measure that assesses symptoms of anxiety over the course of the last two weeks. Daymen obtained a score of 7 suggesting mild anxiety. Cordelro finds the endorsed symptoms to be somewhat difficult. [0= Not at all; 1= Several days; 2= Over half the days; 3= Nearly every day] Feeling nervous, anxious, on  edge 3  Not being able to stop or control worrying 0  Worrying too much about different things (e.g., work, things to do) 3  Trouble relaxing 0  Being so restless that it's hard to sit still 0  Becoming easily annoyed or irritable 1  Feeling afraid as if something awful might happen 0  GAD-7 Score 7   Interventions:  Conducted a chart review Focused on rapport building Verbally administered PHQ-9 and GAD-7 for symptom monitoring Verbally administered Food & Mood questionnaire to assess various behaviors related to emotional eating Provided emphatic reflections and validation Collaborated with patient on a treatment goal  Psychoeducation provided regarding physical versus emotional hunger Recommended/discussed option for longer-term therapeutic services  Provisional DSM-5 Diagnosis(es): 311 (F32.8) Other Specified Depressive Disorder, Emotional Eating Behaviors and 300.00 (F41.9) Unspecified Anxiety Disorder   Plan: Eryc appears able and willing to participate as evidenced by collaboration on a treatment goal, engagement in reciprocal conversation, and asking questions as needed for clarification. The next appointment will be scheduled in two weeks, which will be via MyChart Video Visit. The following treatment goal was established: improve coping skills. This provider will regularly review the treatment plan and medical chart to keep informed of status changes. Midas expressed understanding and agreement with the initial treatment plan of care. Bartolo will be sent a handout via e-mail to utilize between now and the next appointment to increase awareness of hunger patterns and subsequent eating. Tony Tony [name] provided verbal consent during today's appointment for this provider to send the handout via e-mail. Additionally, a referral will be placed with [place].

## 2020-04-14 LAB — COMPREHENSIVE METABOLIC PANEL
ALT: 50 IU/L — ABNORMAL HIGH (ref 0–44)
AST: 38 IU/L (ref 0–40)
Albumin/Globulin Ratio: 1.5 (ref 1.2–2.2)
Albumin: 4.3 g/dL (ref 4.0–5.0)
Alkaline Phosphatase: 72 IU/L (ref 44–121)
BUN/Creatinine Ratio: 14 (ref 9–20)
BUN: 13 mg/dL (ref 6–24)
Bilirubin Total: 0.3 mg/dL (ref 0.0–1.2)
CO2: 19 mmol/L — ABNORMAL LOW (ref 20–29)
Calcium: 8.8 mg/dL (ref 8.7–10.2)
Chloride: 103 mmol/L (ref 96–106)
Creatinine, Ser: 0.94 mg/dL (ref 0.76–1.27)
GFR calc Af Amer: 114 mL/min/{1.73_m2} (ref >59)
GFR calc non Af Amer: 99 mL/min/{1.73_m2} (ref >59)
Globulin, Total: 2.9 g/dL (ref 1.5–4.5)
Glucose: 84 mg/dL (ref 65–99)
Potassium: 4.3 mmol/L (ref 3.5–5.2)
Sodium: 139 mmol/L (ref 134–144)
Total Protein: 7.2 g/dL (ref 6.0–8.5)

## 2020-04-14 LAB — T3: T3, Total: 147 ng/dL (ref 71–180)

## 2020-04-14 LAB — VITAMIN B12: Vitamin B-12: 449 pg/mL (ref 232–1245)

## 2020-04-14 LAB — INSULIN, RANDOM: INSULIN: 35.9 u[IU]/mL — ABNORMAL HIGH (ref 2.6–24.9)

## 2020-04-14 LAB — LIPID PANEL WITH LDL/HDL RATIO
Cholesterol, Total: 174 mg/dL (ref 100–199)
HDL: 43 mg/dL (ref >39)
LDL Chol Calc (NIH): 98 mg/dL (ref 0–99)
LDL/HDL Ratio: 2.3 ratio (ref 0.0–3.6)
Triglycerides: 192 mg/dL — ABNORMAL HIGH (ref 0–149)
VLDL Cholesterol Cal: 33 mg/dL (ref 5–40)

## 2020-04-14 LAB — FOLATE: Folate: 11.8 ng/mL (ref >3.0)

## 2020-04-14 LAB — T4: T4, Total: 5.4 ug/dL (ref 4.5–12.0)

## 2020-04-14 LAB — VITAMIN D 25 HYDROXY (VIT D DEFICIENCY, FRACTURES): Vit D, 25-Hydroxy: 33.4 ng/mL (ref 30.0–100.0)

## 2020-04-19 ENCOUNTER — Telehealth (INDEPENDENT_AMBULATORY_CARE_PROVIDER_SITE_OTHER): Payer: BC Managed Care – PPO | Admitting: Psychology

## 2020-04-19 DIAGNOSIS — F419 Anxiety disorder, unspecified: Secondary | ICD-10-CM | POA: Diagnosis not present

## 2020-04-19 DIAGNOSIS — F3289 Other specified depressive episodes: Secondary | ICD-10-CM | POA: Diagnosis not present

## 2020-04-19 NOTE — Progress Notes (Signed)
Chief Complaint:   Tony Tony [name] (MR# [id number]) is a 45 y.o. male who presents for evaluation and treatment of Tony and related comorbidities. Current BMI is Body mass index is 39.87 kg/m. Tony Tony [name] has been struggling with his weight for many years and has been unsuccessful in either losing weight, maintaining weight loss, or reaching his healthy weight goal.  Tony Tony [name] feels he eats very healthy and exercising regularly, and he is frustrated with his lack of weight loss.  Tony Tony [name] is currently in the action stage of change and ready to dedicate time achieving and maintaining a healthier weight. Tony Tony [name] is interested in becoming our patient and working on intensive lifestyle modifications including (but not limited to) diet and exercise for weight loss.  Tony [name]'s habits were reviewed today and are as follows: His family eats meals together, his desired weight loss is 92 lbs, he has been heavy most of his life, he started gaining weight after college, he has significant food cravings issues and he struggles with emotional eating.  Depression Screen Tony [name]'s Food and Mood (modified PHQ-9) score was 20.  Depression screen Tony [name] [facility] 2/9 04/13/2020  Decreased Interest 3  Down, Depressed, Hopeless 3  PHQ - 2 Score 6  Altered sleeping 3  Tired, decreased energy 3  Change in appetite 1  Feeling bad or failure about yourself  3  Trouble concentrating 0  Moving slowly or fidgety/restless 3  Suicidal thoughts 1  PHQ-9 Score 20  Difficult doing work/chores Extremely dIfficult   Subjective:   1. Other fatigue Tony Tony [name] admits to daytime somnolence and admits to waking up still tired. Patent has a history of symptoms of daytime fatigue. Tony [name] generally gets 6 hours of sleep per night, and states that he has nightime awakenings. Snoring is present. Apneic episodes are present. Epworth Sleepiness Score is 8.  2. Shortness of breath on exertion Tony [name] notes increasing shortness of breath with  exercising and seems to be worsening over time with weight gain. He notes getting out of breath sooner with activity than he used to. This has not gotten worse recently. Tony Tony [name] denies shortness of breath at rest or orthopnea.  3. Essential hypertension Tony Tony [name] blood pressure is well controlled today on medications. She is ready to work on additional lifestyle changes.  4. Hyperlipidemia, mixed Tony Tony [name] is on Crestor, and he is working on diet, exercise, and weight loss.  5. Vitamin D deficiency Tony Tony [name] is on Vit D prescription, and he has no recent labs.  6. Type 2 diabetes mellitus with other specified complication, without long-term current use of insulin (HCC) Tony Tony [name] is not on medications, and appears to be diet controlled recently.  7. Depression with anxiety Tony Tony [name] has a high modified PHQ-9 score, and he has a history of anxiety. His weight appears closely tied to his mood and self-esteem. His weight loss expectations may be unrealistic in his desired time frame.  8. At risk for heart disease Tony Tony [name] is at a higher than average risk for cardiovascular disease due to Tony.   Assessment/Plan:   1. Other fatigue Tony [name] does feel that his weight is causing his energy to be lower than it should be. Fatigue may be related to Tony, depression or many other causes. Labs will be ordered, and in the meanwhile, Tony [name] will focus on self care including making healthy food choices, increasing physical activity and focusing on stress reduction.  - Vitamin B12 - EKG 12-Lead - Folate - T3 - T4  2. Shortness of breath on  exertion Tony Tony [name] does feel that he gets out of breath more easily that he used to when he exercises. Tony Tony [name]'s shortness of breath appears to be Tony related and exercise induced. He has agreed to work on weight loss and gradually increase exercise to treat his exercise induced shortness of breath. Will continue to monitor closely.  3. Essential hypertension Tony [name] will  start his eating plan, and will continue working on healthy weight loss and exercise to improve blood pressure Tony [name]. We will watch for signs of hypotension as he continues his lifestyle modifications.  4. Hyperlipidemia, mixed Cardiovascular risk and specific lipid/LDL goals reviewed. We discussed several lifestyle modifications today. We will check labs today. Tony Tony [name] will continue to work on diet, exercise and weight loss efforts. Orders and follow up as documented in patient record.   - Lipid Panel With LDL/HDL Ratio  5. Vitamin D deficiency Low Vitamin D level contributes to fatigue and are associated with Tony, breast, and colon cancer. We will check labs today. Tony Tony [name] will follow-up for routine testing of Vitamin D, at least 2-3 times per year to avoid over-replacement.  - VITAMIN D 25 Hydroxy (Vit-D Deficiency, Fractures)  6. Type 2 diabetes mellitus with other specified complication, without long-term current use of insulin (HCC) Tony Tony [name] is important to decrease the likelihood of diabetic complications such as nephropathy, neuropathy, limb loss, blindness, coronary artery disease, and death. Intensive lifestyle modification including diet, exercise and weight loss are the first line of treatment for diabetes. We will check labs today.   - Insulin, random - Comprehensive metabolic panel  7. Depression with anxiety Behavior modification techniques were discussed today to help Tony Tony [name] deal with his emotional/non-hunger eating behaviors. We will refer to [name], our Bariatric Psychologist for evaluation. Orders and follow up as documented in patient record.   8. At risk for heart disease Tony Tony [name] was given approximately 30 minutes of coronary artery disease prevention counseling today. He is 45 y.o. male and has risk factors for heart disease including Tony. We discussed intensive lifestyle modifications today with an emphasis on specific weight loss instructions  and strategies.   Repetitive spaced learning was employed today to elicit superior memory formation and behavioral change.  9. Class 3 severe Tony with serious comorbidity and body mass index (BMI) of 40.0 to 44.9 in adult, unspecified Tony type Tony Tony [name]) Tony Tony [name] is currently in the action stage of change and his goal is to continue with weight loss efforts. I recommend Jayant begin the structured treatment plan as follows:  He has agreed to the Category 3 Plan.  Exercise goals: No exercise has been prescribed for now, while we concentrate on nutritional changes.  Behavioral modification strategies: increasing lean protein intake and no skipping meals.  He was informed of the importance of frequent follow-up visits to maximize his success with intensive lifestyle modifications for his multiple health conditions. He was informed we would discuss his lab results at his next visit unless there is a critical issue that needs to be addressed sooner. Tony Tony [name] agreed to keep his next visit at the agreed upon time to discuss these results.  Objective:   Blood pressure 115/71, pulse 70, temperature 98.1 F (36.7 C), height 5' 9"  (1.753 m), weight 270 lb (122.5 kg), SpO2 96 %. Body mass index is 39.87 kg/m.  EKG: Normal sinus rhythm, rate 82 BPM.  Indirect Calorimeter completed today shows a VO2 of 277 and a REE of 1926.  His calculated basal metabolic rate is 5009 thus  his basal metabolic rate is worse than expected.  General: Cooperative, alert, well developed, in no acute distress. HEENT: Conjunctivae and lids unremarkable. Cardiovascular: Regular rhythm.  Lungs: Normal work of breathing. Neurologic: No focal deficits.   Lab Results  Component Value Date   CREATININE 0.94 04/13/2020   BUN 13 04/13/2020   NA 139 04/13/2020   K 4.3 04/13/2020   CL 103 04/13/2020   CO2 19 (L) 04/13/2020   Lab Results  Component Value Date   ALT 50 (H) 04/13/2020   AST 38 04/13/2020   ALKPHOS 72  04/13/2020   BILITOT 0.3 04/13/2020   Lab Results  Component Value Date   HGBA1C 5.9 03/22/2020   HGBA1C 6.2 07/02/2019   HGBA1C 5.8 11/13/2018   HGBA1C 5.6 06/24/2018   HGBA1C 6.0 10/20/2017   Lab Results  Component Value Date   INSULIN 35.9 (H) 04/13/2020   Lab Results  Component Value Date   TSH 1.88 03/22/2020   Lab Results  Component Value Date   CHOL 174 04/13/2020   HDL 43 04/13/2020   LDLCALC 98 04/13/2020   LDLDIRECT 157.0 07/02/2019   TRIG 192 (H) 04/13/2020   CHOLHDL 4 03/22/2020   Lab Results  Component Value Date   WBC 5.8 03/22/2020   HGB 15.5 03/22/2020   HCT 46.0 03/22/2020   MCV 93.4 03/22/2020   PLT 141.0 (L) 03/22/2020   No results found for: IRON, TIBC, FERRITIN  Attestation Statements:   Reviewed by clinician on day of visit: allergies, medications, problem list, medical history, surgical history, family history, social history, and previous encounter notes.   I, Trixie Dredge, am acting as transcriptionist for Dennard Nip, MD.  I have reviewed the above documentation for accuracy and completeness, and I agree with the above. - Dennard Nip, MD

## 2020-04-19 NOTE — Progress Notes (Signed)
  Office: [phone]  /  Fax: [phone]    Date: May 03, 2020   Appointment Start Time: 8:29am Duration: 30 minutes Provider: Glennie Isle, Psy.D. Type of Session: Individual Therapy  Location of Patient: Home Location of Provider: Provider's Home (private office) Type of Contact: Telepsychological Visit via MyChart Video Visit  Session Content: Tony [name] is a 45 y.o. male presenting for a follow-up appointment to address the previously established treatment goal of increasing coping skills. Today's appointment was a telepsychological visit due to COVID-19. Tony [name] provided verbal consent for today's telepsychological appointment and he is aware he is responsible for securing confidentiality on his end of the session. Prior to proceeding with today's appointment, Tony [name]'s physical location at the time of this appointment was obtained as well a phone number he could be reached at in the event of technical difficulties. Tony [name] and this provider participated in today's telepsychological service.   This provider conducted a brief check-in. Tony [name] shared he continues to follow the meal plan, adding, "I still get cravings." This was further explored and processed. Reviewed emotional and physical hunger as Tony [name] acknowledged he did not have the opportunity to look at the previously shared handout. The e-mail was re-sent with Tony [name]'s verbal consent. Tony [name] acknowledged overriding physical hunger cues in the past, not consuming all food on his meal currently, and experiencing cravings currently. Thus, he was engaged in problem solving. He was receptive to engaging in meal prep for snacks and focusing on eating regularly throughout the day. Tony [name] was receptive to today's appointment as evidenced by openness to sharing, responsiveness to feedback, and willingness to implement discussed strategies .  Mental Status Examination:  Appearance: well groomed and appropriate hygiene  Behavior: appropriate to  circumstances Mood: euthymic Affect: mood congruent Speech: normal in rate, volume, and tone Eye Contact: appropriate Psychomotor Activity: unable to assess  Gait: unable to assess Thought Process: linear, logical, and goal directed  Thought Content/Perception: no hallucinations, delusions, bizarre thinking or behavior reported or observed and no evidence of suicidal and homicidal ideation, plan, and intent Orientation: time, person, place, and purpose of appointment Memory/Concentration: memory, attention, language, and fund of knowledge intact  Insight/Judgment: good  Interventions:  Conducted a brief chart review Provided empathic reflections and validation Employed supportive psychotherapy interventions to facilitate reduced distress and to improve coping skills with identified stressors Engaged patient in problem solving Psychoeducation provided regarding physical versus emotional hunger  DSM-5 Diagnosis(es): 311 (F32.8) Other Specified Depressive Disorder, Emotional Eating Behaviors and 300.00 (F41.9) Unspecified Anxiety Disorder  Treatment Goal & Progress: During the initial appointment with this provider, the following treatment goal was established: increase coping skills. Progress is limited, as Tony [name] has just begun treatment with this provider; however, he is receptive to the interaction and interventions and rapport is being established.   Plan: The next appointment will be scheduled in two weeks, which will be via MyChart Video Visit. The next session will focus on working towards the established treatment goal. Chart review revealed Tony [name] is scheduled with a provider at [facility] on March 11th based on the referral placed by this provider.

## 2020-04-21 LAB — HM DIABETES EYE EXAM

## 2020-04-27 ENCOUNTER — Other Ambulatory Visit: Payer: Self-pay

## 2020-04-27 ENCOUNTER — Encounter (INDEPENDENT_AMBULATORY_CARE_PROVIDER_SITE_OTHER): Payer: Self-pay | Admitting: Family Medicine

## 2020-04-27 ENCOUNTER — Ambulatory Visit (INDEPENDENT_AMBULATORY_CARE_PROVIDER_SITE_OTHER): Payer: BC Managed Care – PPO | Admitting: Family Medicine

## 2020-04-27 VITALS — BP 108/66 | HR 64 | Temp 97.9°F | Ht 69.0 in | Wt 264.0 lb

## 2020-04-27 DIAGNOSIS — E559 Vitamin D deficiency, unspecified: Secondary | ICD-10-CM | POA: Diagnosis not present

## 2020-04-27 DIAGNOSIS — Z6839 Body mass index (BMI) 39.0-39.9, adult: Secondary | ICD-10-CM

## 2020-04-27 DIAGNOSIS — Z9189 Other specified personal risk factors, not elsewhere classified: Secondary | ICD-10-CM | POA: Diagnosis not present

## 2020-04-27 DIAGNOSIS — E1169 Type 2 diabetes mellitus with other specified complication: Secondary | ICD-10-CM

## 2020-04-27 DIAGNOSIS — K5909 Other constipation: Secondary | ICD-10-CM | POA: Diagnosis not present

## 2020-04-27 MED ORDER — METFORMIN HCL 500 MG PO TABS: 500.0000 mg | ORAL_TABLET | Freq: Every day | ORAL | 0 refills | Status: DC

## 2020-04-27 MED ORDER — POLYETHYLENE GLYCOL 3350 17 G PO PACK
17.0000 g | PACK | Freq: Every day | ORAL | 0 refills | Status: DC | PRN
Start: 2020-04-27 — End: 2021-04-23

## 2020-05-01 ENCOUNTER — Other Ambulatory Visit: Payer: Self-pay

## 2020-05-01 ENCOUNTER — Encounter: Payer: Self-pay | Admitting: Family Medicine

## 2020-05-01 ENCOUNTER — Ambulatory Visit: Payer: BC Managed Care – PPO | Admitting: Family Medicine

## 2020-05-01 VITALS — BP 128/88 | HR 108 | Ht 69.0 in | Wt 267.0 lb

## 2020-05-01 DIAGNOSIS — M545 Low back pain, unspecified: Secondary | ICD-10-CM

## 2020-05-01 DIAGNOSIS — M5136 Other intervertebral disc degeneration, lumbar region: Secondary | ICD-10-CM | POA: Diagnosis not present

## 2020-05-01 NOTE — Patient Instructions (Signed)
MRI lumbar spine [phone] We will see what this says

## 2020-05-01 NOTE — Progress Notes (Signed)
[facility] [place] Phone: [phone] Subjective:   [name], am serving as a scribe for Dr. Hulan Saas. This visit occurred during the SARS-CoV-2 public health emergency.  Safety protocols were in place, including screening questions prior to the visit, additional usage of staff PPE, and extensive cleaning of exam room while observing appropriate contact time as indicated for disinfecting solutions.   I'm seeing this patient by the request  of:  McGowen, Adrian Blackwater, MD  CC: Low back pain follow-up  JHE:[id number]  Tony [name] is a 45 y.o. male coming in with complaint of acute back pain. Last seen on 04/05/2020 for OMT. Patient states that he has had increase in pain since Friday. Was unable to walk for a few days but is improving. No radiating symptoms. Patient did move a boat out of water by himself.  Patient states other than that he does not know what it causes.  Patient missed multiple things with family this weekend secondary to the discomfort and pain.  Patient tried ibuprofen, gabapentin, vitamins, icing with no significant benefit.  Today does seem to be doing somewhat better though.      Past Medical History:  Diagnosis Date  . Abnormal weight gain 05/25/2015  . Alcohol abuse 2016/2017   Pt states he was self medicating his anxiety.  Quit 04/2015.  Minimal intake as of 05/2017 f/u.  Marland Kitchen Anal fissure   . Anxiety   . Anxiety   . Back pain   . Depression   . Fatty liver   . Hepatic steatosis 2015; 2016; 02/2016   +Alcohol has played a role.  CT abd showed fatty liver+ liver enzymes mildly elevated (chron viral hepatitis screening NEG).  Abd u/s 03/26/16 showed fatty liver, small GB polyps, o/w normal.  . History of kidney stones    [facility]  . HTN (hypertension)   . Hx of colonic polyp - ssp 02/11/2014   No polyps 02/2020->recall 10 yrs  . Hyperlipidemia    Statin started 05/2018  . Insomnia   .  Obesity, Class II, BMI 35-39.9   . Psoriasis   . Secondary male hypogonadism    clomiphene 06/2019: normalized on clomiphene 08/2019  . Shortness of breath on exertion    Past Surgical History:  Procedure Laterality Date  . APPENDECTOMY    . COLONOSCOPY  03/10/2020   No polyps. Recall 2032.  Marland Kitchen COLONOSCOPY W/ BIOPSIES  02/03/14   Cecal polyp (sessile serrated polyp w/out dysplasia) and posterior anal fissure; repeat TCS 2020 per Dr. Carlean Purl  . HAND SURGERY Left 2001   broke left pinkie finger  . LAPAROSCOPIC APPENDECTOMY N/A 02/24/2015   Procedure: APPENDECTOMY LAPAROSCOPIC;  Surgeon: Alphonsa Overall, MD;  Location: [facility];  Service: General;  Laterality: N/A;  . right ankle     2014   Social History   Socioeconomic History  . Marital status: Married    Spouse name: Tony [name]  . Number of children: Not on file  . Years of education: Not on file  . Highest education level: Not on file  Occupational History  . Occupation: Secondary school teacher  Tobacco Use  . Smoking status: Never Smoker  . Smokeless tobacco: Never Used  Vaping Use  . Vaping Use: Never used  Substance and Sexual Activity  . Alcohol use: Not Currently    Alcohol/week: 0.0 standard drinks    Comment: 2019 stopped alcohol  . Drug use: No  . Sexual  activity: Yes    Partners: Female    Birth control/protection: None  Other Topics Concern  . Not on file  Social History Narrative   Married (spring 2019), no children.   Orig from [place].   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   No tobacco.   Alcohol: hx of "heavy drinking".  Cut back as of 04/2015.   Social Determinants of Health   Financial Resource Strain: Not on file  Food Insecurity: Not on file  Transportation Needs: Not on file  Physical Activity: Not on file  Stress: Not on file  Social Connections: Not on file   No Known Allergies Family History  Problem Relation Age of Onset  . Diabetes Mother   . Restless legs syndrome Mother   . Heart disease Mother   . Heart  attack Mother 26  . Hypertension Mother   . Hyperlipidemia Mother   . Depression Mother   . Anxiety disorder Mother   . Sleep apnea Mother   . Psoriasis Father   . Sleep apnea Father   . Stroke Neg Hx   . Cancer Neg Hx   . Colon cancer Neg Hx   . Colon polyps Neg Hx   . Esophageal cancer Neg Hx   . Rectal cancer Neg Hx   . Stomach cancer Neg Hx     Current Outpatient Medications (Endocrine & Metabolic):  .  metFORMIN (GLUCOPHAGE) 500 MG tablet, Take 1 tablet (500 mg total) by mouth daily with breakfast.  Current Outpatient Medications (Cardiovascular):  .  lisinopril (ZESTRIL) 10 MG tablet, TAKE ONE TABLET BY MOUTH DAILY .  propranolol (INDERAL) 10 MG tablet, Take 10 mg by mouth 2 (two) times daily. .  rosuvastatin (CRESTOR) 5 MG tablet, TAKE ONE TABLET BY MOUTH DAILY   Current Outpatient Medications (Analgesics):  .  meloxicam (MOBIC) 15 MG tablet, Take 15 mg by mouth daily as needed for pain.   Current Outpatient Medications (Other):  .  carbamazepine (TEGRETOL) 100 MG chewable tablet, TAKE 1 TABLET BY MOUTH IN THE MORNING THEN TAKE 2 TABLETS BY MOUTH AT BEDTIME .  clonazePAM (KLONOPIN) 1 MG tablet, Take 1 mg by mouth 3 (three) times daily as needed for anxiety. .  fluticasone (CUTIVATE) 0.05 % cream, Apply to affected areas bid prn .  gabapentin (NEURONTIN) 100 MG capsule, Take 2 capsules (200 mg total) by mouth at bedtime. .  polyethylene glycol (MIRALAX / GLYCOLAX) 17 g packet, Take 17 g by mouth daily as needed. .  sertraline (ZOLOFT) 25 MG tablet, Take 12.5 mg by mouth daily. .  Vitamin D, Ergocalciferol, (DRISDOL) 1.25 MG (50000 UNIT) CAPS capsule, TAKE ONE CAPSULE BY MOUTH EVERY 7 DAYS   Reviewed prior external information including notes and imaging from  primary care provider As well as notes that were available from care everywhere and other healthcare systems.  Past medical history, social, surgical and family history all reviewed in electronic medical record.   No pertanent information unless stated regarding to the chief complaint.   Review of Systems:  No headache, visual changes, nausea, vomiting, diarrhea, constipation, dizziness, abdominal pain, skin rash, fevers, chills, night sweats, weight loss, swollen lymph nodes, body aches, joint swelling, chest pain, shortness of breath, mood changes. POSITIVE muscle aches  Objective  Blood pressure 128/88, pulse (!) 108, height 5' 9"  (1.753 m), weight 267 lb (121.1 kg), SpO2 97 %.   General: No apparent distress alert and oriented x3 mood and affect normal, dressed appropriately.  HEENT: Pupils equal, extraocular movements  intact  Respiratory: Patient's speak in full sentences and does not appear short of breath  Cardiovascular: No lower extremity edema, non tender, no erythema  Gait normal with good balance and coordination.  MSK: Low back exam does have loss of lordosis.  Patient does have mild radicular symptoms on the right side with straight leg test.  Tender to palpation of the paraspinal musculature of the lumbar spine on the right side.  No significant weakness noted though of the lower extremity. Neurovascularly intact distally.   Impression and Recommendations:     The above documentation has been reviewed and is accurate and complete Lyndal Pulley, DO

## 2020-05-01 NOTE — Progress Notes (Signed)
Chief Complaint:   OBESITY Tony [name] is here to discuss his progress with his obesity treatment plan along with follow-up of his obesity related diagnoses. Tony [name] is on the Category 3 Plan and states he is following his eating plan approximately 90% of the time. Tony [name] states he is walking 3-4 miles 2 times per week.   Today's visit was #: 2 Starting weight: 270 lbs Starting date: 04/13/2020 Today's weight: 264 lbs Today's date: 04/27/2020 Total lbs lost to date: 6 Total lbs lost since last in-office visit: 6  Interim History: Tony [name] has done well with weight loss on his Category 3 plan. He notes his hunger was well controlled, but his sleep wasn't as good for the first 10 days.it has improved in the last 2-3 days.  Subjective:   1. Vitamin D deficiency Tony [name] is on Vit D prescription and OTC. His Vit D level is not yet at goal. I discussed labs with the patient today.  2. Type 2 diabetes mellitus with other specified complication, without long-term current use of insulin (HCC) Tony [name]'s A1c is well controlled, but his fasting insulin was elevated which contributed to his weight gain. He was briefly on glipizide. I discussed labs with the patient today.  3. Other constipation Tony [name] notes decreased BM frequency since starting his eating plan. He took dulcolax and he had some relief.  4. At risk for heart disease Tony [name] is at a higher than average risk for cardiovascular disease due to obesity.   Assessment/Plan:   1. Vitamin D deficiency Low Vitamin D level contributes to fatigue and are associated with obesity, breast, and colon cancer. Tony [name] agreed to continue taking prescription Vitamin D and OTC Vit D as is, and we will recheck labs in 3 months. He will follow-up for routine testing of Vitamin D, at least 2-3 times per year to avoid over-replacement.  2. Type 2 diabetes mellitus with other specified complication, without long-term current use of insulin (HCC) Good blood sugar  control is important to decrease the likelihood of diabetic complications such as nephropathy, neuropathy, limb loss, blindness, coronary artery disease, and death. Intensive lifestyle modification including diet, exercise and weight loss are the first line of treatment for diabetes. Tony [name] agreed to start metformin 500 mg q AM with no refills (He is to start at 1/2 tablet q AM).   - metFORMIN (GLUCOPHAGE) 500 MG tablet; Take 1 tablet (500 mg total) by mouth daily with breakfast.  Dispense: 30 tablet; Refill: 0  3. Other constipation Tony [name] agreed to start miralax 17 grams daily and increase his water intake. He was informed that a decrease in bowel movement frequency is normal while losing weight, but stools should not be hard or painful. Will continue to monitor. Orders and follow up as documented in patient record.   - polyethylene glycol (MIRALAX / GLYCOLAX) 17 g packet; Take 17 g by mouth daily as needed.  Dispense: 30 each; Refill: 0  4. At risk for heart disease Tony [name] was given approximately 30 minutes of coronary artery disease prevention counseling today. He is 45 y.o. male and has risk factors for heart disease including obesity. We discussed intensive lifestyle modifications today with an emphasis on specific weight loss instructions and strategies.   Repetitive spaced learning was employed today to elicit superior memory formation and behavioral change.  5. Class 2 severe obesity with serious comorbidity and body mass index (BMI) of 39.0 to 39.9 in adult, unspecified obesity type (HCC) Tony [name] is currently in the action  stage of change. As such, his goal is to continue with weight loss efforts. He has agreed to the Category 3 Plan with breakfast options.   Exercise goals: As is.  Behavioral modification strategies: increasing lean protein intake.  Tony [name] has agreed to follow-up with our clinic in 2 to 3 weeks. He was informed of the importance of frequent follow-up visits to maximize  his success with intensive lifestyle modifications for his multiple health conditions.   Objective:   Blood pressure 108/66, pulse 64, temperature 97.9 F (36.6 C), height 5' 9"  (1.753 m), weight 264 lb (119.7 kg), SpO2 97 %. Body mass index is 38.99 kg/m.  General: Cooperative, alert, well developed, in no acute distress. HEENT: Conjunctivae and lids unremarkable. Cardiovascular: Regular rhythm.  Lungs: Normal work of breathing. Neurologic: No focal deficits.   Lab Results  Component Value Date   CREATININE 0.94 04/13/2020   BUN 13 04/13/2020   NA 139 04/13/2020   K 4.3 04/13/2020   CL 103 04/13/2020   CO2 19 (L) 04/13/2020   Lab Results  Component Value Date   ALT 50 (H) 04/13/2020   AST 38 04/13/2020   ALKPHOS 72 04/13/2020   BILITOT 0.3 04/13/2020   Lab Results  Component Value Date   HGBA1C 5.9 03/22/2020   HGBA1C 6.2 07/02/2019   HGBA1C 5.8 11/13/2018   HGBA1C 5.6 06/24/2018   HGBA1C 6.0 10/20/2017   Lab Results  Component Value Date   INSULIN 35.9 (H) 04/13/2020   Lab Results  Component Value Date   TSH 1.88 03/22/2020   Lab Results  Component Value Date   CHOL 174 04/13/2020   HDL 43 04/13/2020   LDLCALC 98 04/13/2020   LDLDIRECT 157.0 07/02/2019   TRIG 192 (H) 04/13/2020   CHOLHDL 4 03/22/2020   Lab Results  Component Value Date   WBC 5.8 03/22/2020   HGB 15.5 03/22/2020   HCT 46.0 03/22/2020   MCV 93.4 03/22/2020   PLT 141.0 (L) 03/22/2020   No results found for: IRON, TIBC, FERRITIN  Attestation Statements:   Reviewed by clinician on day of visit: allergies, medications, problem list, medical history, surgical history, family history, social history, and previous encounter notes.   I, Tony [name], am acting as transcriptionist for Dennard Nip, MD.  I have reviewed the above documentation for accuracy and completeness, and I agree with the above. -  Dennard Nip, MD

## 2020-05-01 NOTE — Assessment & Plan Note (Signed)
Known degenerative disc disease.  Patient is concerned because anytime he tries to increase activity starts having worsening pain now at this time.  Patient has a goal to start playing golf again but finds that he is unable to do so.  Patient is concerned for the last 3 days was unable to even get out of bed.  Today he is feeling better and has some very intermittent radicular symptoms.  At this point patient has failed all conservative therapy including formal physical therapy, home exercises, gabapentin and meloxicam.  We will get MRI to see if patient is a candidate for possible epidurals

## 2020-05-03 ENCOUNTER — Telehealth (INDEPENDENT_AMBULATORY_CARE_PROVIDER_SITE_OTHER): Payer: BC Managed Care – PPO | Admitting: Psychology

## 2020-05-03 DIAGNOSIS — F3289 Other specified depressive episodes: Secondary | ICD-10-CM

## 2020-05-03 DIAGNOSIS — F419 Anxiety disorder, unspecified: Secondary | ICD-10-CM | POA: Diagnosis not present

## 2020-05-03 NOTE — Progress Notes (Signed)
  Office: [phone]  /  Fax: [phone]    Date: May 17, 2020   Appointment Start Time: 8:30am Duration: 32 minutes Provider: Glennie Isle, Psy.D. Type of Session: Individual Therapy  Location of Patient: Home Location of Provider: Provider's Home (private office) Type of Contact: Telepsychological Visit via MyChart Video Visit  Session Content: Tony [name] is a 45 y.o. male presenting for a follow-up appointment to address the previously established treatment goal of increasing coping skills. Today's appointment was a telepsychological visit due to COVID-19. Tony [name] provided verbal consent for today's telepsychological appointment and he is aware he is responsible for securing confidentiality on his end of the session. Prior to proceeding with today's appointment, Tony [name]'s physical location at the time of this appointment was obtained as well a phone number he could be reached at in the event of technical difficulties. Tony [name] and this provider participated in today's telepsychological service. Of note, today's appointment was switched to a regular telephone call at 8:35am with Tony [name]'s verbal consent due to technical difficulties.  This provider conducted a brief check-in. Tony [name] reported, "The eating thing has gotten a little better." Reviewed triggers for emotional eating. Psychoeducation regarding thought defusion, including its impact on emotional eating and overall well-being was provided. Tony [name] was led through a thought defusion exercise, and a handout with various exercises was provided. Tony [name] was encouraged to engage in the thought defusion exercises between now and the next appointment with this provider. Tony [name] agreed. For the thought defusion exercise during today's appointment, Tony [name] utilized the following thought: "I am worried." His experience was processed after the exercise; he was heard laughing. He was led through additional exercises. Tony [name] provided verbal consent during  today's appointment for this provider to send a handout with thought defusion exercises via e-mail. Tony [name] was receptive to today's appointment as evidenced by openness to sharing, responsiveness to feedback, and willingness to engage in thought defusion exercises to assist with coping.  Mental Status Examination:  Appearance: well groomed and appropriate hygiene  Behavior: appropriate to circumstances Mood: euthymic Affect: mood congruent Speech: normal in rate, volume, and tone Eye Contact: appropriate Psychomotor Activity: unable to assess  Gait: unable to assess Thought Process: linear, logical, and goal directed  Thought Content/Perception: no hallucinations, delusions, bizarre thinking or behavior reported or observed and no evidence of suicidal and homicidal ideation, plan, and intent Orientation: time, person, place, and purpose of appointment Memory/Concentration: memory, attention, language, and fund of knowledge intact  Insight/Judgment: good  Interventions:  Conducted a brief chart review Provided empathic reflections and validation Reviewed content from the previous session Employed supportive psychotherapy interventions to facilitate reduced distress and to improve coping skills with identified stressors Psychoeducation provided regarding thought defusion Engaged patient in thought defusion exercise(s)  DSM-5 Diagnosis(es): 311 (F32.8) Other Specified Depressive Disorder, Emotional Eating Behaviors and 300.00 (F41.9) Unspecified Anxiety Disorder  Treatment Goal & Progress: During the initial appointment with this provider, the following treatment goal was established: increase coping skills. Tony [name] has demonstrated progress in his goal as evidenced by increased awareness of hunger patterns and increased awareness of triggers for emotional eating. Tony [name] also demonstrates willingness to engage in thought defusion exercises.  Plan: The next appointment will be scheduled in  two weeks, which will be via MyChart Video Visit. The next session will focus on working towards the established treatment goal. Additionally, Tony [name] plans to contact [facility] today to switch to another provider.

## 2020-05-05 ENCOUNTER — Ambulatory Visit (INDEPENDENT_AMBULATORY_CARE_PROVIDER_SITE_OTHER): Payer: BC Managed Care – PPO | Admitting: Psychologist

## 2020-05-05 DIAGNOSIS — F320 Major depressive disorder, single episode, mild: Secondary | ICD-10-CM | POA: Diagnosis not present

## 2020-05-12 ENCOUNTER — Other Ambulatory Visit: Payer: Self-pay | Admitting: Family Medicine

## 2020-05-16 ENCOUNTER — Other Ambulatory Visit: Payer: Self-pay

## 2020-05-16 ENCOUNTER — Ambulatory Visit (INDEPENDENT_AMBULATORY_CARE_PROVIDER_SITE_OTHER): Payer: BC Managed Care – PPO | Admitting: Family Medicine

## 2020-05-16 ENCOUNTER — Encounter (INDEPENDENT_AMBULATORY_CARE_PROVIDER_SITE_OTHER): Payer: Self-pay | Admitting: Family Medicine

## 2020-05-16 VITALS — BP 111/69 | HR 69 | Temp 98.2°F | Ht 69.0 in | Wt 259.0 lb

## 2020-05-16 DIAGNOSIS — R7303 Prediabetes: Secondary | ICD-10-CM | POA: Diagnosis not present

## 2020-05-16 DIAGNOSIS — K5909 Other constipation: Secondary | ICD-10-CM | POA: Diagnosis not present

## 2020-05-16 DIAGNOSIS — Z9189 Other specified personal risk factors, not elsewhere classified: Secondary | ICD-10-CM

## 2020-05-16 DIAGNOSIS — Z6839 Body mass index (BMI) 39.0-39.9, adult: Secondary | ICD-10-CM

## 2020-05-17 ENCOUNTER — Telehealth (INDEPENDENT_AMBULATORY_CARE_PROVIDER_SITE_OTHER): Payer: BC Managed Care – PPO | Admitting: Psychology

## 2020-05-17 ENCOUNTER — Other Ambulatory Visit: Payer: Self-pay | Admitting: Family Medicine

## 2020-05-17 DIAGNOSIS — F419 Anxiety disorder, unspecified: Secondary | ICD-10-CM | POA: Diagnosis not present

## 2020-05-17 DIAGNOSIS — F3289 Other specified depressive episodes: Secondary | ICD-10-CM | POA: Diagnosis not present

## 2020-05-17 NOTE — Progress Notes (Signed)
  Office: [phone]  /  Fax: [phone]    Date: May 31, 2020   Appointment Start Time: 8:00am Duration: 30 minutes Provider: Glennie Isle, Psy.D. Type of Session: Individual Therapy  Location of Patient: Home Location of Provider: Provider's Home (private office) Type of Contact: Telepsychological Visit via MyChart Video Visit  Session Content: Tony [name] is a 45 y.o. male presenting for a follow-up appointment to address the previously established treatment goal of increasing coping skills. Today's appointment was a telepsychological visit due to COVID-19. Tony [name] provided verbal consent for today's telepsychological appointment and he is aware he is responsible for securing confidentiality on his end of the session. Prior to proceeding with today's appointment, Tony [name]'s physical location at the time of this appointment was obtained as well a phone number he could be reached at in the event of technical difficulties. Tony [name] and this provider participated in today's telepsychological service.   This provider conducted a brief check-in. Tony [name] reported he continues to focus on his eating habits. Regarding emotional eating, he noted he finds himself eating between meals. This was further explored. It was reflected that he is not eating everything on his structured meal plan and he is likely experiencing physical hunger between meals. Thus, he was engaged in problem solving. Moreover, session focused further on worry related to engaging in physical activity given his history. Psychoeducation provided regarding grounding techniques and he was engaged in an exercise involving his senses. Tony [name] provided verbal consent during today's appointment for this provider to send a handout with today's exercise via e-mail. Tony [name] was receptive to today's appointment as evidenced by openness to sharing, responsiveness to feedback, and willingness to implement discussed strategies .  Mental Status Examination:   Appearance: well groomed and appropriate hygiene  Behavior: appropriate to circumstances Mood: euthymic Affect: mood congruent Speech: normal in rate, volume, and tone Eye Contact: appropriate Psychomotor Activity: unable to assess  Gait: unable to assess Thought Process: linear, logical, and goal directed  Thought Content/Perception: no hallucinations, delusions, bizarre thinking or behavior reported or observed and no evidence of suicidal and homicidal ideation, plan, and intent Orientation: time, person, place, and purpose of appointment Memory/Concentration: memory, attention, language, and fund of knowledge intact  Insight/Judgment: good  Interventions:  Conducted a brief chart review Provided empathic reflections and validation Employed supportive psychotherapy interventions to facilitate reduced distress and to improve coping skills with identified stressors Engaged patient in problem solving Psychoeducation provided regarding grounding techniques Engaged in grounding exercise  DSM-5 Diagnosis(es): 311 (F32.8) Other Specified Depressive Disorder, Emotional Eating Behaviors and 300.00 (F41.9) Unspecified Anxiety Disorder  Treatment Goal & Progress: During the initial appointment with this provider, the following treatment goal was established: increase coping skills. Tony [name] has demonstrated progress in his goal as evidenced by increased awareness of hunger patterns and increased awareness of triggers for emotional eating. Tony [name] also continues to demonstrate willingness to engage in learned skill(s).  Plan: The next appointment will be scheduled in two weeks, which will be via MyChart Video Visit. The next session will focus on working towards the established treatment goal and termination planning. Additionally, Tony [name] reported he will be meeting with Dr. Cheryln [name] ([facility]) on June 12, 2020.

## 2020-05-18 NOTE — Progress Notes (Signed)
Chief Complaint:   OBESITY Tony [name] is here to discuss his progress with his obesity treatment plan along with follow-up of his obesity related diagnoses. Tony [name] is on the Category 3 Plan with breakfast options and states he is following his eating plan approximately 85-90% of the time. Tony [name] states he is walking for 30 minutes 7 times per week.  Today's visit was #: 3 Starting weight: 270 lbs Starting date: 04/13/2020 Today's weight: 259 lbs Today's date: 05/16/2020 Total lbs lost to date: 11 Total lbs lost since last in-office visit: 5  Interim History: [name] continues to do well with weight loss on his plan. He is getting bored with his plan and would like to expand his food choices.  Subjective:   1. Other constipation Tony [name] is on miralax and he notes his BMs are less frequent, and he has taken OTC mag citrate to help.  2. Pre-diabetes Tony [name] is stable on low dose metformin (1/2 tablet q daily) with no GI upset.  3. At risk for diarrhea Tony [name] is at risk for diarrhea due to increased fiber and nutrition.  Assessment/Plan:   1. Other constipation Real was advised that his BM are less frequent when he is losing weight. High fiber foods were discussed. Orders and follow up as documented in patient record.   2. Pre-diabetes Tony [name] agreed to increase metformin to 1 tablet PO q daily. He will continue to work on weight loss, exercise, and decreasing simple carbohydrates to help decrease the risk of diabetes.   3. At risk for diarrhea Tony [name] was given approximately 15 minutes of diarrhea prevention counseling today. He is 45 y.o. male and has risk factors for diarrhea including medications and changes in diet. We discussed intensive lifestyle modifications today with an emphasis on specific weight loss instructions including dietary strategies.   Repetitive spaced learning was employed today to elicit superior memory formation and behavioral change.  4. Obesity with  current BMI 38.2 Tony [name] is currently in the action stage of change. As such, his goal is to continue with weight loss efforts. He has agreed to keeping a food journal and adhering to recommended goals of 1400-1700 calories and 100+ grams of protein daily.   Exercise goals: As is.  Behavioral modification strategies: increasing lean protein intake.  Tony [name] has agreed to follow-up with our clinic in 2 to 3 weeks. He was informed of the importance of frequent follow-up visits to maximize his success with intensive lifestyle modifications for his multiple health conditions.   Objective:   Blood pressure 111/69, pulse 69, temperature 98.2 F (36.8 C), height 5' 9"  (1.753 m), weight 259 lb (117.5 kg), SpO2 98 %. Body mass index is 38.25 kg/m.  General: Cooperative, alert, well developed, in no acute distress. HEENT: Conjunctivae and lids unremarkable. Cardiovascular: Regular rhythm.  Lungs: Normal work of breathing. Neurologic: No focal deficits.   Lab Results  Component Value Date   CREATININE 0.94 04/13/2020   BUN 13 04/13/2020   NA 139 04/13/2020   K 4.3 04/13/2020   CL 103 04/13/2020   CO2 19 (L) 04/13/2020   Lab Results  Component Value Date   ALT 50 (H) 04/13/2020   AST 38 04/13/2020   ALKPHOS 72 04/13/2020   BILITOT 0.3 04/13/2020   Lab Results  Component Value Date   HGBA1C 5.9 03/22/2020   HGBA1C 6.2 07/02/2019   HGBA1C 5.8 11/13/2018   HGBA1C 5.6 06/24/2018   HGBA1C 6.0 10/20/2017   Lab Results  Component Value Date  INSULIN 35.9 (H) 04/13/2020   Lab Results  Component Value Date   TSH 1.88 03/22/2020   Lab Results  Component Value Date   CHOL 174 04/13/2020   HDL 43 04/13/2020   LDLCALC 98 04/13/2020   LDLDIRECT 157.0 07/02/2019   TRIG 192 (H) 04/13/2020   CHOLHDL 4 03/22/2020   Lab Results  Component Value Date   WBC 5.8 03/22/2020   HGB 15.5 03/22/2020   HCT 46.0 03/22/2020   MCV 93.4 03/22/2020   PLT 141.0 (L) 03/22/2020   No results  found for: IRON, TIBC, FERRITIN  Attestation Statements:   Reviewed by clinician on day of visit: allergies, medications, problem list, medical history, surgical history, family history, social history, and previous encounter notes.   I, Trixie Dredge, am acting as transcriptionist for Dennard Nip, MD.  I have reviewed the above documentation for accuracy and completeness, and I agree with the above. -  Dennard Nip, MD

## 2020-05-20 ENCOUNTER — Ambulatory Visit
Admission: RE | Admit: 2020-05-20 | Discharge: 2020-05-20 | Disposition: A | Discharge status: Home / Self Care | Payer: BC Managed Care – PPO | Source: Ambulatory Visit | Attending: Family Medicine | Admitting: Family Medicine

## 2020-05-20 ENCOUNTER — Other Ambulatory Visit: Payer: Self-pay

## 2020-05-20 DIAGNOSIS — M545 Low back pain, unspecified: Secondary | ICD-10-CM

## 2020-05-22 ENCOUNTER — Telehealth: Payer: Self-pay

## 2020-05-22 ENCOUNTER — Ambulatory Visit: Payer: BC Managed Care – PPO | Admitting: Psychologist

## 2020-05-22 NOTE — Telephone Encounter (Signed)
Patient calling to go over his MRI results and has some questions he would like to ask in regard to the imaging

## 2020-05-23 ENCOUNTER — Other Ambulatory Visit: Payer: Self-pay

## 2020-05-23 ENCOUNTER — Other Ambulatory Visit (INDEPENDENT_AMBULATORY_CARE_PROVIDER_SITE_OTHER): Payer: Self-pay | Admitting: Family Medicine

## 2020-05-23 DIAGNOSIS — E1169 Type 2 diabetes mellitus with other specified complication: Secondary | ICD-10-CM

## 2020-05-23 DIAGNOSIS — M5416 Radiculopathy, lumbar region: Secondary | ICD-10-CM

## 2020-05-23 NOTE — Telephone Encounter (Signed)
Would you like to refill?

## 2020-05-23 NOTE — Telephone Encounter (Signed)
RF x 1

## 2020-05-25 ENCOUNTER — Telehealth: Payer: Self-pay

## 2020-05-25 ENCOUNTER — Ambulatory Visit
Admission: RE | Admit: 2020-05-25 | Discharge: 2020-05-25 | Disposition: A | Discharge status: Home / Self Care | Payer: BC Managed Care – PPO | Source: Ambulatory Visit | Attending: Family Medicine | Admitting: Family Medicine

## 2020-05-25 DIAGNOSIS — M47817 Spondylosis without myelopathy or radiculopathy, lumbosacral region: Secondary | ICD-10-CM | POA: Diagnosis not present

## 2020-05-25 DIAGNOSIS — M5416 Radiculopathy, lumbar region: Secondary | ICD-10-CM

## 2020-05-25 MED ORDER — METHYLPREDNISOLONE ACETATE 40 MG/ML INJ SUSP (RADIOLOG
120.0000 mg | Freq: Once | INTRAMUSCULAR | Status: AC
  Administered 2020-05-25: 120 mg via EPIDURAL

## 2020-05-25 MED ORDER — IOPAMIDOL (ISOVUE-M 200) INJECTION 41%
1.0000 mL | Freq: Once | INTRAMUSCULAR | Status: AC
  Administered 2020-05-25: 1 mL via EPIDURAL

## 2020-05-25 NOTE — Discharge Instructions (Signed)

## 2020-05-25 NOTE — Telephone Encounter (Signed)
Patient called stating he had his epidural today and got to reading the AVS and it says that it is not unusual when the numbing agency wears off for you to have a couple days of pain and to take pain meds as described by referring physician. Patient stated that he was not given any meds and wanted to talk to someone before the weekend to know if he needs to take something or what can he take if the pain get bad.

## 2020-05-25 NOTE — Telephone Encounter (Signed)
Lets see what happens before we react.

## 2020-05-25 NOTE — Telephone Encounter (Signed)
Sent patient MyChart message.

## 2020-05-27 ENCOUNTER — Other Ambulatory Visit: Payer: Self-pay | Admitting: Family Medicine

## 2020-05-28 ENCOUNTER — Other Ambulatory Visit: Payer: Self-pay | Admitting: Family Medicine

## 2020-05-29 ENCOUNTER — Telehealth: Payer: Self-pay

## 2020-05-29 NOTE — Telephone Encounter (Signed)
LVM for pt to CB regarding rx.  Note: Per Dr. Anitra Lauth last OV pt was to d/c med and try Ambien.

## 2020-05-29 NOTE — Telephone Encounter (Signed)
Noted  

## 2020-05-29 NOTE — Telephone Encounter (Signed)
Spoke with pt who stated that he was experiencing similar sx but will try again and will CB after trying Ambien again.   FYI

## 2020-05-29 NOTE — Telephone Encounter (Signed)
Patient calls regarding prescription --- pharmacy states it was denied by provider --- pt comes in every 6 months to see Dr. Anitra Lauth, seen in January   Eszopiclone 3 [facility] TABS [[id number]     [place] [address] [place]

## 2020-05-30 ENCOUNTER — Ambulatory Visit (INDEPENDENT_AMBULATORY_CARE_PROVIDER_SITE_OTHER): Payer: BC Managed Care – PPO | Admitting: Adult Health

## 2020-05-30 ENCOUNTER — Other Ambulatory Visit: Payer: Self-pay

## 2020-05-30 ENCOUNTER — Encounter (INDEPENDENT_AMBULATORY_CARE_PROVIDER_SITE_OTHER): Payer: Self-pay | Admitting: Adult Health

## 2020-05-30 VITALS — BP 114/73 | HR 79 | Temp 98.2°F | Ht 69.0 in | Wt 258.0 lb

## 2020-05-30 DIAGNOSIS — I152 Hypertension secondary to endocrine disorders: Secondary | ICD-10-CM

## 2020-05-30 DIAGNOSIS — E1159 Type 2 diabetes mellitus with other circulatory complications: Secondary | ICD-10-CM

## 2020-05-30 DIAGNOSIS — Z9189 Other specified personal risk factors, not elsewhere classified: Secondary | ICD-10-CM

## 2020-05-30 DIAGNOSIS — Z6841 Body Mass Index (BMI) 40.0 and over, adult: Secondary | ICD-10-CM

## 2020-05-30 DIAGNOSIS — E1169 Type 2 diabetes mellitus with other specified complication: Secondary | ICD-10-CM | POA: Diagnosis not present

## 2020-05-30 MED ORDER — METFORMIN HCL 500 MG PO TABS: 1.0000 | ORAL_TABLET | Freq: Every day | ORAL | 0 refills | Status: DC

## 2020-05-30 MED ORDER — METFORMIN HCL 500 MG PO TABS: ORAL_TABLET | ORAL | 0 refills | Status: DC

## 2020-05-31 ENCOUNTER — Telehealth (INDEPENDENT_AMBULATORY_CARE_PROVIDER_SITE_OTHER): Payer: BC Managed Care – PPO | Admitting: Psychology

## 2020-05-31 DIAGNOSIS — F419 Anxiety disorder, unspecified: Secondary | ICD-10-CM | POA: Diagnosis not present

## 2020-05-31 DIAGNOSIS — F3289 Other specified depressive episodes: Secondary | ICD-10-CM

## 2020-05-31 NOTE — Progress Notes (Signed)
Chief Complaint:   OBESITY Tony [name] is here to discuss his progress with his obesity treatment plan along with follow-up of his obesity related diagnoses. Tony [name] is on the Category 3 Plan and keeping a food journal and adhering to recommended goals of 1400-1700 calories and 100 + g protein and states he is following his eating plan approximately 50% of the time. Tony [name] states he is not currently exercising.  Today's visit was #: 4 Starting weight: 270 lbs Starting date: 04/13/2020 Today's weight: 258 lbs Today's date: 05/30/2020 Total lbs lost to date: 12 Total lbs lost since last in-office visit: 1  Interim History: Tony [name] has been following category 3 meal plan the last 2 weeks and not tracking intake. He has been taking Metformin 500 mg 1/2 tab with breakfast. He will occasionally experience late afternoon/early evening polyphagia.   Subjective:   1. Type 2 diabetes mellitus with other specified complication, without long-term current use of insulin (HCC) Tony [name] is on Metformin 500 mg 1/2 tab with breakfast. He was preciously diet controlled. He has not titrated up to a full tab yet.  Lab Results  Component Value Date   HGBA1C 5.9 03/22/2020   HGBA1C 6.2 07/02/2019   HGBA1C 5.8 11/13/2018   Lab Results  Component Value Date   MICROALBUR <0.7 07/02/2019   LDLCALC 98 04/13/2020   CREATININE 0.94 04/13/2020   Lab Results  Component Value Date   INSULIN 35.9 (H) 04/13/2020    2. Hypertension associated with diabetes ([place]) Tony [name]'s BP/HR are excellent at [place]. He is on lisinopril 10 mg QD and propranolol 10 mg BID.  BP Readings from Last 3 Encounters:  05/30/20 114/73  05/25/20 (!) 141/90  05/16/20 111/69   3. At risk for diarrhea Tony [name] is at higher risk of diarrhea due to recently starting Metformin.  Assessment/Plan:   1. Type 2 diabetes mellitus with other specified complication, without long-term current use of insulin (HCC) Good blood sugar control is important  to decrease the likelihood of diabetic complications such as nephropathy, neuropathy, limb loss, blindness, coronary artery disease, and death. Intensive lifestyle modification including diet, exercise and weight loss are the first line of treatment for diabetes.   - metFORMIN (GLUCOPHAGE) 500 MG tablet; 1/2 tab with breakfast 1/2 with lunch  Dispense: 30 tablet; Refill: 0  2. Hypertension associated with diabetes ([place]) Tony [name] is working on healthy weight loss and exercise to improve blood pressure control. We will watch for signs of hypotension as he continues his lifestyle modifications. Continue ACE and beta blocker.  3. At risk for diarrhea Tony [name] was given approximately 15 minutes of diarrhea prevention counseling today. He is 45 y.o. male and has risk factors for diarrhea including medications and changes in diet. We discussed intensive lifestyle modifications today with an emphasis on specific weight loss instructions including dietary strategies.   Repetitive spaced learning was employed today to elicit superior memory formation and behavioral change.  4. Current BMI 38.2 Tony [name] is currently in the action stage of change. As such, his goal is to continue with weight loss efforts. He has agreed to keeping a food journal and adhering to recommended goals of 1400-1700 calories and 100+ g protein.   Reviewed journaling goals and using My Fitness Pal Handout: Eating Out Guide  Exercise goals: As is  Behavioral modification strategies: increasing lean protein intake, meal planning and cooking strategies, travel eating strategies and planning for success.  Tony [name] has agreed to follow-up with our clinic in 2 weeks. He  was informed of the importance of frequent follow-up visits to maximize his success with intensive lifestyle modifications for his multiple health conditions.   Objective:   Blood pressure 114/73, pulse 79, temperature 98.2 F (36.8 C), height 5' 9"  (1.753 m), weight 258 lb  (117 kg), SpO2 97 %. Body mass index is 38.1 kg/m.  General: Cooperative, alert, well developed, in no acute distress. HEENT: Conjunctivae and lids unremarkable. Cardiovascular: Regular rhythm.  Lungs: Normal work of breathing. Neurologic: No focal deficits.   Lab Results  Component Value Date   CREATININE 0.94 04/13/2020   BUN 13 04/13/2020   NA 139 04/13/2020   K 4.3 04/13/2020   CL 103 04/13/2020   CO2 19 (L) 04/13/2020   Lab Results  Component Value Date   ALT 50 (H) 04/13/2020   AST 38 04/13/2020   ALKPHOS 72 04/13/2020   BILITOT 0.3 04/13/2020   Lab Results  Component Value Date   HGBA1C 5.9 03/22/2020   HGBA1C 6.2 07/02/2019   HGBA1C 5.8 11/13/2018   HGBA1C 5.6 06/24/2018   HGBA1C 6.0 10/20/2017   Lab Results  Component Value Date   INSULIN 35.9 (H) 04/13/2020   Lab Results  Component Value Date   TSH 1.88 03/22/2020   Lab Results  Component Value Date   CHOL 174 04/13/2020   HDL 43 04/13/2020   LDLCALC 98 04/13/2020   LDLDIRECT 157.0 07/02/2019   TRIG 192 (H) 04/13/2020   CHOLHDL 4 03/22/2020   Lab Results  Component Value Date   WBC 5.8 03/22/2020   HGB 15.5 03/22/2020   HCT 46.0 03/22/2020   MCV 93.4 03/22/2020   PLT 141.0 (L) 03/22/2020    Attestation Statements:   Reviewed by clinician on day of visit: allergies, medications, problem list, medical history, surgical history, family history, social history, and previous encounter notes.   Coral Ceo, am acting as Location manager for Mina Marble, NP.  I have reviewed the above documentation for accuracy and completeness, and I agree with the above. -  Salome Hautala d. Torunn Chancellor, NP-C

## 2020-05-31 NOTE — Progress Notes (Signed)
  Office: [phone]  /  Fax: [phone]    Date: June 14, 2020   Appointment Start Time: 8:02am Duration: 23 minutes Provider: Glennie Isle, Psy.D. Type of Session: Individual Therapy  Location of Patient: Home Location of Provider: Provider's Home (private office) Type of Contact: Telepsychological Visit via MyChart Video Visit  Session Content: Tony [name] is a 45 y.o. male presenting for a follow-up appointment to address the previously established treatment goal of increasing coping skills. Today's appointment was a telepsychological visit due to COVID-19. [place] provided verbal consent for today's telepsychological appointment and he is aware he is responsible for securing confidentiality on his end of the session. Prior to proceeding with today's appointment, [name]'s physical location at the time of this appointment was obtained as well a phone number he could be reached at in the event of technical difficulties. Tony [name] and this provider participated in today's telepsychological service.   This provider conducted a brief check-in. [name] shared about recent events, including his appointments with Dr. Leafy Ro and Dr. Cheryln [name]. [name] previously shared concern about his eating habits while traveling. Thus, psychoeducation regarding making better choices and engaging in portion control during the holidays/celebrations/vacations was provided. More specifically, this provider discussed the following strategies: coming to meals hungry, but not starving; avoid filling up on appetizers; managing portion sizes; not completely depriving yourself; making the plate colorful (e.g., vegetables); pacing yourself (e.g., waiting 10 minutes before going back for seconds); taking advantage of the nutritious foods; practicing mindfulness; staying hydrated; and avoid bringing home leftovers. Furthermore, termination planning was discussed. [name] was receptive to a follow-up appointment in 3-4 weeks and an  additional follow-up/termination appointment in 3-4 weeks after that. [name] was receptive to today's appointment as evidenced by openness to sharing, responsiveness to feedback, and willingness to implement discussed strategies .  Mental Status Examination:  Appearance: well groomed and appropriate hygiene  Behavior: appropriate to circumstances Mood: euthymic Affect: mood congruent Speech: normal in rate, volume, and tone Eye Contact: appropriate Psychomotor Activity: unable to assess  Gait: unable to assess Thought Process: linear, logical, and goal directed  Thought Content/Perception: no hallucinations, delusions, bizarre thinking or behavior reported or observed and no evidence or endorsement of suicidal and homicidal ideation, plan, and intent Orientation: time, person, place, and purpose of appointment Memory/Concentration: memory, attention, language, and fund of knowledge intact  Insight/Judgment: good  Interventions:  Conducted a brief chart review Provided empathic reflections and validation Employed supportive psychotherapy interventions to facilitate reduced distress and to improve coping skills with identified stressors Discussed behavioral strategies for holdiays/celebrations/vacations Discussed termination planning  DSM-5 Diagnosis(es):  311 (F32.8) Other Specified Depressive Disorder, Emotional Eating Behaviors and 300.00 (F41.9) Unspecified Anxiety Disorder   Treatment Goal & Progress: During the initial appointment with this provider, the following treatment goal was established: increase coping skills. [name] has demonstrated progress in his goal as evidenced by increased awareness of hunger patterns and increased awareness of triggers for emotional eating. [name] also continues to demonstrate willingness to engage in learned skill(s).  Plan: The next appointment will be scheduled in 3-4 weeks, which will be via MyChart Video Visit. The next session will focus on  working towards the established treatment goal. Additionally, Tony [name] shared he plans to continue meeting with Dr. Cheryln [name] for therapeutic services.

## 2020-06-07 DIAGNOSIS — F410 Panic disorder [episodic paroxysmal anxiety] without agoraphobia: Secondary | ICD-10-CM | POA: Diagnosis not present

## 2020-06-07 DIAGNOSIS — F411 Generalized anxiety disorder: Secondary | ICD-10-CM | POA: Diagnosis not present

## 2020-06-12 ENCOUNTER — Ambulatory Visit (INDEPENDENT_AMBULATORY_CARE_PROVIDER_SITE_OTHER): Payer: BC Managed Care – PPO | Admitting: Psychology

## 2020-06-12 DIAGNOSIS — F411 Generalized anxiety disorder: Secondary | ICD-10-CM

## 2020-06-13 ENCOUNTER — Ambulatory Visit (INDEPENDENT_AMBULATORY_CARE_PROVIDER_SITE_OTHER): Payer: BC Managed Care – PPO | Admitting: Family Medicine

## 2020-06-13 ENCOUNTER — Encounter (INDEPENDENT_AMBULATORY_CARE_PROVIDER_SITE_OTHER): Payer: Self-pay | Admitting: Family Medicine

## 2020-06-13 ENCOUNTER — Other Ambulatory Visit: Payer: Self-pay

## 2020-06-13 VITALS — BP 100/62 | HR 68 | Temp 98.1°F | Ht 69.0 in | Wt 255.0 lb

## 2020-06-13 DIAGNOSIS — I10 Essential (primary) hypertension: Secondary | ICD-10-CM

## 2020-06-13 DIAGNOSIS — Z6839 Body mass index (BMI) 39.0-39.9, adult: Secondary | ICD-10-CM

## 2020-06-14 ENCOUNTER — Telehealth (INDEPENDENT_AMBULATORY_CARE_PROVIDER_SITE_OTHER): Payer: BC Managed Care – PPO | Admitting: Psychology

## 2020-06-14 DIAGNOSIS — F419 Anxiety disorder, unspecified: Secondary | ICD-10-CM

## 2020-06-14 DIAGNOSIS — F3289 Other specified depressive episodes: Secondary | ICD-10-CM

## 2020-06-19 NOTE — Progress Notes (Signed)
Chief Complaint:   OBESITY Tony [name] is here to discuss his progress with his obesity treatment plan along with follow-up of his obesity related diagnoses. Tony [name] is on keeping a food journal and adhering to recommended goals of 1400-1700 calories and 100+ grams of protein daily and states he is following his eating plan approximately 80% of the time. Tony [name] states he is walking for 30 minutes 5 times per week.  Today's visit was #: 5 Starting weight: 270 lbs Starting date: 04/13/2020 Today's weight: 255 lbs Today's date: 06/13/2020 Total lbs lost to date: 15 Total lbs lost since last in-office visit: 3  Interim History: Tony [name] continues to do well with weight loss, even over Easter. He is walking for exercise, and sometimes outdoors and sometimes on the treadmill. He is disappointed that his weight loss has slowed, but he was reassured he is losing weight at a healthy and appropriate rate.  Subjective:   1. Essential hypertension [place]'s blood pressure is well controlled on lisinopril and propranolol. He denies feelings lightheaded, but is at high risk of hypotension.  Assessment/Plan:   1. Essential hypertension Tony [name] is to continue diet and exercise, and make sure to increase his water intake improve blood pressure control. We will continue to monitor closely as he continues his lifestyle modifications.  2. Obesity with current BMI of 37.7 Tony [name] is currently in the action stage of change. As such, his goal is to continue with weight loss efforts. He has agreed to keeping a food journal and adhering to recommended goals of 1400-1700 calories and 100+ grams of protein daily.   Exercise goals: As is. Tony [name] is to start to increase incline on the treadmill.  Behavioral modification strategies: increasing water intake, no skipping meals, meal planning and cooking strategies and keeping a strict food journal.  Tony [name] has agreed to follow-up with our clinic in 2 weeks. He was  informed of the importance of frequent follow-up visits to maximize his success with intensive lifestyle modifications for his multiple health conditions.   Objective:   Blood pressure 100/62, pulse 68, temperature 98.1 F (36.7 C), height 5' 9"  (1.753 m), weight 255 lb (115.7 kg), SpO2 97 %. Body mass index is 37.66 kg/m.  General: Cooperative, alert, well developed, in no acute distress. HEENT: Conjunctivae and lids unremarkable. Cardiovascular: Regular rhythm.  Lungs: Normal work of breathing. Neurologic: No focal deficits.   Lab Results  Component Value Date   CREATININE 0.94 04/13/2020   BUN 13 04/13/2020   NA 139 04/13/2020   K 4.3 04/13/2020   CL 103 04/13/2020   CO2 19 (L) 04/13/2020   Lab Results  Component Value Date   ALT 50 (H) 04/13/2020   AST 38 04/13/2020   ALKPHOS 72 04/13/2020   BILITOT 0.3 04/13/2020   Lab Results  Component Value Date   HGBA1C 5.9 03/22/2020   HGBA1C 6.2 07/02/2019   HGBA1C 5.8 11/13/2018   HGBA1C 5.6 06/24/2018   HGBA1C 6.0 10/20/2017   Lab Results  Component Value Date   INSULIN 35.9 (H) 04/13/2020   Lab Results  Component Value Date   TSH 1.88 03/22/2020   Lab Results  Component Value Date   CHOL 174 04/13/2020   HDL 43 04/13/2020   LDLCALC 98 04/13/2020   LDLDIRECT 157.0 07/02/2019   TRIG 192 (H) 04/13/2020   CHOLHDL 4 03/22/2020   Lab Results  Component Value Date   WBC 5.8 03/22/2020   HGB 15.5 03/22/2020   HCT 46.0 03/22/2020  MCV 93.4 03/22/2020   PLT 141.0 (L) 03/22/2020   No results found for: IRON, TIBC, FERRITIN  Attestation Statements:   Reviewed by clinician on day of visit: allergies, medications, problem list, medical history, surgical history, family history, social history, and previous encounter notes.  Time spent on visit including pre-visit chart review and post-visit care and charting was 20 minutes.    I, Trixie Dredge, am acting as transcriptionist for Dennard Nip, MD.  I have  reviewed the above documentation for accuracy and completeness, and I agree with the above. -  Dennard Nip, MD

## 2020-06-26 ENCOUNTER — Encounter (INDEPENDENT_AMBULATORY_CARE_PROVIDER_SITE_OTHER): Payer: Self-pay | Admitting: Family Medicine

## 2020-06-26 ENCOUNTER — Other Ambulatory Visit: Payer: Self-pay

## 2020-06-26 ENCOUNTER — Ambulatory Visit (INDEPENDENT_AMBULATORY_CARE_PROVIDER_SITE_OTHER): Payer: BC Managed Care – PPO | Admitting: Family Medicine

## 2020-06-26 VITALS — BP 103/68 | HR 74 | Temp 98.1°F | Ht 69.0 in | Wt 252.0 lb

## 2020-06-26 DIAGNOSIS — Z9189 Other specified personal risk factors, not elsewhere classified: Secondary | ICD-10-CM

## 2020-06-26 DIAGNOSIS — Z6839 Body mass index (BMI) 39.0-39.9, adult: Secondary | ICD-10-CM

## 2020-06-26 DIAGNOSIS — R7303 Prediabetes: Secondary | ICD-10-CM

## 2020-06-26 MED ORDER — METFORMIN HCL 500 MG PO TABS
ORAL_TABLET | ORAL | 0 refills | Status: DC
Start: 2020-06-26 — End: 2020-07-10

## 2020-06-26 NOTE — Progress Notes (Signed)
  Office: [phone]  /  Fax: [phone]    Date: Jul 10, 2020   Appointment Start Time: 8:01am Duration: 30 minutes Provider: Glennie Isle, Psy.D. Type of Session: Individual Therapy  Location of Patient: Home Location of Provider: Provider's Home (private office) Type of Contact: Telepsychological Visit via MyChart Video Visit  Session Content: Tony [name] is a 45 y.o. male presenting for a follow-up appointment to address the previously established treatment goal of increasing coping skills. Today's appointment was a telepsychological visit due to COVID-19. Boss provided verbal consent for today's telepsychological appointment and he is aware he is responsible for securing confidentiality on his end of the session. Prior to proceeding with today's appointment, Tony [name]'s physical location at the time of this appointment was obtained as well a phone number he could be reached at in the event of technical difficulties. Tony [name] and this provider participated in today's telepsychological service.   This provider conducted a brief check-in. Tony [name] stated, "Tony [name] been eating out probably 75% of the time the last two weeks." He explained it was secondary to renovations in the kitchen. He described making better choices and engaging in portion control. Nonetheless, Tony [name] acknowledged putting "a lot of pressure" on himself regarding the number on the scale and his hope for certain weight loss by the 85th day of his journey, which is today. Associated thoughts and feelings were processed. He acknowledged the following positive changes he has noticed aside from the number on the scale: "clothes fit better;" more energy; improved sleep; improvement in mood; and more ease in engaging in physical activity. To further assist with coping, psychoeducation was provided regarding radical acceptance. This provider and Tony [name] discussed and identified pleasurable activities involving the senses to assist with  self-soothing. Tony [name] was receptive to today's appointment as evidenced by openness to sharing, responsiveness to feedback, and willingness to implement discussed strategies .  Mental Status Examination:  Appearance: well groomed and appropriate hygiene  Behavior: appropriate to circumstances Mood: euthymic Affect: mood congruent Speech: normal in rate, volume, and tone Eye Contact: appropriate Psychomotor Activity: unable to assess  Gait: unable to assess Thought Process: linear, logical, and goal directed  Thought Content/Perception: no hallucinations, delusions, bizarre thinking or behavior reported or observed and no evidence or endorsement of suicidal and homicidal ideation, plan, and intent Orientation: time, person, place, and purpose of appointment Memory/Concentration: memory, attention, language, and fund of knowledge intact  Insight/Judgment: good  Interventions:  Conducted a brief chart review Provided empathic reflections and validation Employed supportive psychotherapy interventions to facilitate reduced distress and to improve coping skills with identified stressors Psychoeducation provided regarding radical acceptance  DSM-5 Diagnosis(es): F32.89 Other Specified Depressive Disorder, Emotional Eating Behaviors and F41.9 Unspecified Anxiety Disorder  Treatment Goal & Progress: During the initial appointment with this provider, the following treatment goal was established: increase coping skills. Tony [name] has demonstrated progress in his goal as evidenced by increased awareness of hunger patterns and increased awareness of triggers for emotional eating. Tony [name] also continues to demonstrate willingness to engage in learned skill(s).  Plan: The next appointment will be scheduled in one month, which will be via MyChart Video Visit. The next session will focus on working towards the established treatment goal and termination.

## 2020-06-27 NOTE — Progress Notes (Signed)
Chief Complaint:   OBESITY Tony [name] is here to discuss his progress with his obesity treatment plan along with follow-up of his obesity related diagnoses. Tony [name] is on keeping a food journal and adhering to recommended goals of 1400-1700 calories and 100+ grams of protein daily and states he is following his eating plan approximately 85% of the time. Tony [name] states he is walking for 120 minutes 5 times per week.  Today's visit was #: 6 Starting weight: 270 lbs Starting date: 04/13/2020 Today's weight: 252 lbs Today's date: 06/26/2020 Total lbs lost to date: 18 Total lbs lost since last in-office visit: 3  Interim History: Tony [name] continues to do well with weight loss. He is journaling and doing well meeting his protein goals. His hunger is mostly controlled and he has been very active with landscaping and yard work this last few weeks.  Subjective:   1. Pre-diabetes Tony [name] is doing well with diet and he is tolerating metformin well. He denies nausea or vomiting.  2. At risk for diabetes mellitus Tony [name] is at higher than average risk for developing diabetes due to obesity.   Assessment/Plan:   1. Pre-diabetes Tony [name] will continue to work on weight loss, exercise, and decreasing simple carbohydrates to help decrease the risk of diabetes. We will recheck labs in 2 weeks, and we will refill metformin for 1 month.  - metFORMIN (GLUCOPHAGE) 500 MG tablet; 1/2 tab with breakfast 1/2 with lunch  Dispense: 30 tablet; Refill: 0  2. At risk for diabetes mellitus Tony [name] was given approximately 15 minutes of diabetes education and counseling today. We discussed intensive lifestyle modifications today with an emphasis on weight loss as well as increasing exercise and decreasing simple carbohydrates in his diet. We also reviewed medication options with an emphasis on risk versus benefit of those discussed.   Repetitive spaced learning was employed today to elicit superior memory formation and  behavioral change.  3. Obesity with current BMI 37.2 Tony [name] is currently in the action stage of change. As such, his goal is to continue with weight loss efforts. He has agreed to keeping a food journal and adhering to recommended goals of 1400-1700 calories and 100+ grams of protein daily.   Exercise goals: As is.  Behavioral modification strategies: increasing lean protein intake and meal planning and cooking strategies.  Tony [name] has agreed to follow-up with our clinic in 2 weeks. He was informed of the importance of frequent follow-up visits to maximize his success with intensive lifestyle modifications for his multiple health conditions.   Objective:   Blood pressure 103/68, pulse 74, temperature 98.1 F (36.7 C), height 5' 9"  (1.753 m), weight 252 lb (114.3 kg), SpO2 97 %. Body mass index is 37.21 kg/m.  General: Cooperative, alert, well developed, in no acute distress. HEENT: Conjunctivae and lids unremarkable. Cardiovascular: Regular rhythm.  Lungs: Normal work of breathing. Neurologic: No focal deficits.   Lab Results  Component Value Date   CREATININE 0.94 04/13/2020   BUN 13 04/13/2020   NA 139 04/13/2020   K 4.3 04/13/2020   CL 103 04/13/2020   CO2 19 (L) 04/13/2020   Lab Results  Component Value Date   ALT 50 (H) 04/13/2020   AST 38 04/13/2020   ALKPHOS 72 04/13/2020   BILITOT 0.3 04/13/2020   Lab Results  Component Value Date   HGBA1C 5.9 03/22/2020   HGBA1C 6.2 07/02/2019   HGBA1C 5.8 11/13/2018   HGBA1C 5.6 06/24/2018   HGBA1C 6.0 10/20/2017   Lab Results  Component Value Date   INSULIN 35.9 (H) 04/13/2020   Lab Results  Component Value Date   TSH 1.88 03/22/2020   Lab Results  Component Value Date   CHOL 174 04/13/2020   HDL 43 04/13/2020   LDLCALC 98 04/13/2020   LDLDIRECT 157.0 07/02/2019   TRIG 192 (H) 04/13/2020   CHOLHDL 4 03/22/2020   Lab Results  Component Value Date   WBC 5.8 03/22/2020   HGB 15.5 03/22/2020   HCT 46.0  03/22/2020   MCV 93.4 03/22/2020   PLT 141.0 (L) 03/22/2020   No results found for: IRON, TIBC, FERRITIN  Attestation Statements:   Reviewed by clinician on day of visit: allergies, medications, problem list, medical history, surgical history, family history, social history, and previous encounter notes.   I, Trixie Dredge, am acting as transcriptionist for Dennard Nip, MD.  I have reviewed the above documentation for accuracy and completeness, and I agree with the above. -  Dennard Nip, MD

## 2020-07-03 NOTE — Progress Notes (Signed)
[place] [address] [place] Phone: [phone] Subjective:   I Tony [name] am serving as a Education administrator for Dr. Hulan Saas.  This visit occurred during the SARS-CoV-2 public health emergency.  Safety protocols were in place, including screening questions prior to the visit, additional usage of staff PPE, and extensive cleaning of exam room while observing appropriate contact time as indicated for disinfecting solutions.   I'm seeing this patient by the request  of:  McGowen, Adrian Blackwater, MD  CC: Low back pain follow-up  TKZ:[id number]   05/01/2020 Known degenerative disc disease.  Patient is concerned because anytime he tries to increase activity starts having worsening pain now at this time.  Patient has a goal to start playing golf again but finds that he is unable to do so.  Patient is concerned for the last 3 days was unable to even get out of bed.  Today he is feeling better and has some very intermittent radicular symptoms.  At this point patient has failed all conservative therapy including formal physical therapy, home exercises, gabapentin and meloxicam.  We will get MRI to see if patient is a candidate for possible epidurals  Update 07/04/2020 Tony [name] is a 45 y.o. male coming in with complaint of back pain. Epidural 05/25/2020. States he is feeling much better.  Patient states that it has been life altering.  Patient has been able to increase activity significantly.  Able to lose weight.  Very happy with the results.  States that this is the best his back his pain and may be a decade     Past Medical History:  Diagnosis Date  . Abnormal weight gain 05/25/2015  . Alcohol abuse 2016/2017   Pt states he was self medicating his anxiety.  Quit 04/2015.  Minimal intake as of 05/2017 f/u.  Marland Kitchen Anal fissure   . Anxiety   . Anxiety   . Back pain   . Depression   . Fatty liver   . Hepatic steatosis 2015; 2016; 02/2016   +Alcohol has  played a role.  CT abd showed fatty liver+ liver enzymes mildly elevated (chron viral hepatitis screening NEG).  Abd u/s 03/26/16 showed fatty liver, small GB polyps, o/w normal.  . History of kidney stones    [facility]  . HTN (hypertension)   . Hx of colonic polyp - ssp 02/11/2014   No polyps 02/2020->recall 10 yrs  . Hyperlipidemia    Statin started 05/2018  . Insomnia   . Obesity, Class II, BMI 35-39.9   . Psoriasis   . Secondary male hypogonadism    clomiphene 06/2019: normalized on clomiphene 08/2019  . Shortness of breath on exertion    Past Surgical History:  Procedure Laterality Date  . APPENDECTOMY    . COLONOSCOPY  03/10/2020   No polyps. Recall 2032.  Marland Kitchen COLONOSCOPY W/ BIOPSIES  02/03/14   Cecal polyp (sessile serrated polyp w/out dysplasia) and posterior anal fissure; repeat TCS 2020 per Dr. Carlean Purl  . HAND SURGERY Left 2001   broke left pinkie finger  . LAPAROSCOPIC APPENDECTOMY N/A 02/24/2015   Procedure: APPENDECTOMY LAPAROSCOPIC;  Surgeon: Alphonsa Overall, MD;  Location: [facility];  Service: General;  Laterality: N/A;  . right ankle     2014   Social History   Socioeconomic History  . Marital status: Married    Spouse name: Roselyn Reef  . Number of children: Not on file  . Years of education: Not on file  .  Highest education level: Not on file  Occupational History  . Occupation: Secondary school teacher  Tobacco Use  . Smoking status: Never Smoker  . Smokeless tobacco: Never Used  Vaping Use  . Vaping Use: Never used  Substance and Sexual Activity  . Alcohol use: Not Currently    Alcohol/week: 0.0 standard drinks    Comment: 2019 stopped alcohol  . Drug use: No  . Sexual activity: Yes    Partners: Female    Birth control/protection: None  Other Topics Concern  . Not on file  Social History Narrative   Married (spring 2019), no children.   Orig from [place].   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   No tobacco.   Alcohol: hx of "heavy drinking".  Cut back as of 04/2015.    Social Determinants of Health   Financial Resource Strain: Not on file  Food Insecurity: Not on file  Transportation Needs: Not on file  Physical Activity: Not on file  Stress: Not on file  Social Connections: Not on file   No Known Allergies Family History  Problem Relation Age of Onset  . Diabetes Mother   . Restless legs syndrome Mother   . Heart disease Mother   . Heart attack Mother 57  . Hypertension Mother   . Hyperlipidemia Mother   . Depression Mother   . Anxiety disorder Mother   . Sleep apnea Mother   . Psoriasis Father   . Sleep apnea Father   . Stroke Neg Hx   . Cancer Neg Hx   . Colon cancer Neg Hx   . Colon polyps Neg Hx   . Esophageal cancer Neg Hx   . Rectal cancer Neg Hx   . Stomach cancer Neg Hx     Current Outpatient Medications (Endocrine & Metabolic):  .  metFORMIN (GLUCOPHAGE) 500 MG tablet, 1/2 tab with breakfast 1/2 with lunch  Current Outpatient Medications (Cardiovascular):  .  lisinopril (ZESTRIL) 10 MG tablet, TAKE ONE TABLET BY MOUTH DAILY .  propranolol (INDERAL) 10 MG tablet, Take 10 mg by mouth 2 (two) times daily. .  rosuvastatin (CRESTOR) 5 MG tablet, TAKE ONE TABLET BY MOUTH DAILY   Current Outpatient Medications (Analgesics):  .  meloxicam (MOBIC) 15 MG tablet, Take 15 mg by mouth daily as needed for pain.   Current Outpatient Medications (Other):  .  carbamazepine (TEGRETOL) 100 MG chewable tablet, TAKE 1 TABLET BY MOUTH IN THE MORNING THEN TAKE 2 TABLETS BY MOUTH AT BEDTIME .  clonazePAM (KLONOPIN) 1 MG tablet, Take 1 mg by mouth 3 (three) times daily as needed for anxiety. .  fluticasone (CUTIVATE) 0.05 % cream, Apply to affected areas bid prn .  gabapentin (NEURONTIN) 100 MG capsule, Take 2 capsules (200 mg total) by mouth at bedtime. .  polyethylene glycol (MIRALAX / GLYCOLAX) 17 g packet, Take 17 g by mouth daily as needed. .  sertraline (ZOLOFT) 25 MG tablet, Take 12.5 mg by mouth daily. .  Vitamin D, Ergocalciferol,  (DRISDOL) 1.25 MG (50000 UNIT) CAPS capsule, TAKE ONE CAPSULE BY MOUTH EVERY 7 DAYS   Reviewed prior external information including notes and imaging from  primary care provider As well as notes that were available from care everywhere and other healthcare systems.  Past medical history, social, surgical and family history all reviewed in electronic medical record.  No pertanent information unless stated regarding to the chief complaint.   Review of Systems:  No headache, visual changes, nausea, vomiting, diarrhea, constipation, dizziness, abdominal pain, skin rash,  fevers, chills, night sweats, weight loss, swollen lymph nodes, body aches, joint swelling, chest pain, shortness of breath, mood changes. POSITIVE muscle aches  Objective  Blood pressure 114/80, pulse 77, height 5' 9"  (1.753 m), weight 257 lb (116.6 kg), SpO2 95 %.   General: No apparent distress alert and oriented x3 mood and affect normal, dressed appropriately.  HEENT: Pupils equal, extraocular movements intact  Respiratory: Patient's speak in full sentences and does not appear short of breath  Cardiovascular: No lower extremity edema, non tender, no erythema  Gait normal with good balance and coordination.  MSK:    Low back exam very mild loss of lordosis.  Patient has improvement in range of motion.  5/5 strength noted of the lower extremities.  Negative straight leg test.  Osteopathic findings T7 extended rotated and side bent right L3 flexed rotated and side bent left Sacrum right on right    Impression and Recommendations:     The above documentation has been reviewed and is accurate and complete Lyndal Pulley, DO

## 2020-07-04 ENCOUNTER — Other Ambulatory Visit: Payer: Self-pay

## 2020-07-04 ENCOUNTER — Encounter: Payer: Self-pay | Admitting: Family Medicine

## 2020-07-04 ENCOUNTER — Ambulatory Visit: Payer: BC Managed Care – PPO | Admitting: Family Medicine

## 2020-07-04 VITALS — BP 114/80 | HR 77 | Ht 69.0 in | Wt 257.0 lb

## 2020-07-04 DIAGNOSIS — E559 Vitamin D deficiency, unspecified: Secondary | ICD-10-CM

## 2020-07-04 DIAGNOSIS — M9904 Segmental and somatic dysfunction of sacral region: Secondary | ICD-10-CM | POA: Diagnosis not present

## 2020-07-04 DIAGNOSIS — M5136 Other intervertebral disc degeneration, lumbar region: Secondary | ICD-10-CM | POA: Diagnosis not present

## 2020-07-04 DIAGNOSIS — M9903 Segmental and somatic dysfunction of lumbar region: Secondary | ICD-10-CM

## 2020-07-04 DIAGNOSIS — M9902 Segmental and somatic dysfunction of thoracic region: Secondary | ICD-10-CM | POA: Diagnosis not present

## 2020-07-04 DIAGNOSIS — M999 Biomechanical lesion, unspecified: Secondary | ICD-10-CM

## 2020-07-04 NOTE — Assessment & Plan Note (Signed)
Patient did respond extremely well to the epidural.  Patient states that it is life-changing.  Able to increase activity and is losing weight.  Patient is feeling like he is making significant progress.  Continuing on the once weekly vitamin D.  Continuing with the weight loss.  Follow-up with me again in 3 to 4 months

## 2020-07-04 NOTE — Assessment & Plan Note (Signed)
Continue vitamin D supplementation

## 2020-07-04 NOTE — Assessment & Plan Note (Signed)
   Decision today to treat with OMT was based on Physical Exam  After verbal consent patient was treated with HVLA, ME, techniques in , thoracic, lumbar and sacral areas, all areas are chronic   Patient tolerated the procedure well with improvement in symptoms  Patient given exercises, stretches and lifestyle modifications  See medications in patient instructions if given  Patient will follow up in 3 to 4 months

## 2020-07-04 NOTE — Patient Instructions (Signed)
Good to see you Keep up the weight loss Great to see you doing well See me again in 3 month

## 2020-07-06 ENCOUNTER — Ambulatory Visit (INDEPENDENT_AMBULATORY_CARE_PROVIDER_SITE_OTHER): Payer: BC Managed Care – PPO | Admitting: Family Medicine

## 2020-07-06 ENCOUNTER — Telehealth: Payer: Self-pay

## 2020-07-06 NOTE — Telephone Encounter (Signed)
Pt called asking if he can get a trial run of a higher dose of eszopiclone. He stated that he took a old Rx of [place] and it worked good. He thinks that if he has a higher dose of eszopiclone that it may work for him.   RF request for eszopiclone LOV: 03/22/20 Next ov: 07/14/20 Last written: 09/14/19 (90,1)

## 2020-07-06 NOTE — Telephone Encounter (Signed)
Pt scheduled for 07/13/20

## 2020-07-06 NOTE — Telephone Encounter (Signed)
Needs o/v to discuss this. He is overdue for [facility] f/u anyway.

## 2020-07-10 ENCOUNTER — Ambulatory Visit (INDEPENDENT_AMBULATORY_CARE_PROVIDER_SITE_OTHER): Payer: BC Managed Care – PPO | Admitting: Adult Health

## 2020-07-10 ENCOUNTER — Telehealth (INDEPENDENT_AMBULATORY_CARE_PROVIDER_SITE_OTHER): Payer: BC Managed Care – PPO | Admitting: Psychology

## 2020-07-10 ENCOUNTER — Other Ambulatory Visit: Payer: Self-pay

## 2020-07-10 ENCOUNTER — Encounter (INDEPENDENT_AMBULATORY_CARE_PROVIDER_SITE_OTHER): Payer: Self-pay | Admitting: Adult Health

## 2020-07-10 VITALS — BP 99/63 | HR 63 | Temp 97.7°F | Ht 69.0 in | Wt 251.0 lb

## 2020-07-10 DIAGNOSIS — E1169 Type 2 diabetes mellitus with other specified complication: Secondary | ICD-10-CM | POA: Diagnosis not present

## 2020-07-10 DIAGNOSIS — Z6839 Body mass index (BMI) 39.0-39.9, adult: Secondary | ICD-10-CM

## 2020-07-10 DIAGNOSIS — F419 Anxiety disorder, unspecified: Secondary | ICD-10-CM | POA: Diagnosis not present

## 2020-07-10 DIAGNOSIS — F3289 Other specified depressive episodes: Secondary | ICD-10-CM

## 2020-07-10 DIAGNOSIS — E1159 Type 2 diabetes mellitus with other circulatory complications: Secondary | ICD-10-CM

## 2020-07-10 DIAGNOSIS — Z9189 Other specified personal risk factors, not elsewhere classified: Secondary | ICD-10-CM | POA: Diagnosis not present

## 2020-07-10 DIAGNOSIS — I152 Hypertension secondary to endocrine disorders: Secondary | ICD-10-CM

## 2020-07-10 DIAGNOSIS — E559 Vitamin D deficiency, unspecified: Secondary | ICD-10-CM

## 2020-07-10 MED ORDER — METFORMIN HCL 500 MG PO TABS: ORAL_TABLET | ORAL | 0 refills | Status: DC

## 2020-07-10 NOTE — Progress Notes (Signed)
Chief Complaint:   OBESITY Tony [name] is here to discuss his progress with his obesity treatment plan along with follow-up of his obesity related diagnoses. Tony [name] is on keeping a food journal and adhering to recommended goals of 1400-1700 calories and 100 g protein and states he is following his eating plan approximately 25% of the time. Tony [name] states he is cardio and weight training 90 minutes 5 times per week.  Today's visit was #: 7 Starting weight: 270 lbs Starting date: 04/13/2020 Today's weight: 251 lbs Today's date: 07/10/2020 Total lbs lost to date: 19 lbs Total lbs lost since last in-office visit: 1  Interim History: Tony [name] has eaten out everyday for the last 2 weeks due to kitchen renovation. He is pleased to be down a pound since his last OV. [place] trip in 85 days- He would like to loss another 30 lbs prior to the trip.  Subjective:   1. Hypertension associated with diabetes (Tony [name]) BP soft today.  SBP at last several OV 100-110's. Tony [name] does not check ambulatory BP. He denies chest pain/dyspnea/dizziness with position changes.  He is on lisinopril 10 mg QD and propranolol 10 mg BID.  BP Readings from Last 3 Encounters:  07/10/20 99/63  07/04/20 114/80  06/26/20 103/68   2. Type 2 diabetes mellitus with other specified complication, without long-term current use of insulin (HCC) Tony [name] is on Metformin 500 mg- will either take 1/2 tab with breakfast and 1/2 tab with lunch, or 1 full tab at dinner.  Lab Results  Component Value Date   HGBA1C 5.9 03/22/2020   HGBA1C 6.2 07/02/2019   HGBA1C 5.8 11/13/2018   Lab Results  Component Value Date   MICROALBUR <0.7 07/02/2019   LDLCALC 98 04/13/2020   CREATININE 0.94 04/13/2020   Lab Results  Component Value Date   INSULIN 35.9 (H) 04/13/2020    3. Vitamin D deficiency Tony [name]'s Vitamin D level was 33.4 on 04/13/2020. He is currently taking prescription vitamin D 50,000 IU each week. He denies nausea, vomiting or  muscle weakness.  4. At risk for complication associated with hypotension Tony [name] is at risk for complications associated with hypotension due to steady weight loss.  Assessment/Plan:   1. Hypertension associated with diabetes ([place]) Tony [name] is working on healthy weight loss and exercise to improve blood pressure control. We will watch for signs of hypotension as he continues his lifestyle modifications.  -Remain well hydrated.  -Purchase a BP cuff to check BP at home and bring readings to next OV. -Discussed anti-hypertensive therapy with PCP at appointment next week.  2. Type 2 diabetes mellitus with other specified complication, without long-term current use of insulin (HCC) Good blood sugar control is important to decrease the likelihood of diabetic complications such as nephropathy, neuropathy, limb loss, blindness, coronary artery disease, and death. Intensive lifestyle modification including diet, exercise and weight loss are the first line of treatment for diabetes.  Check labs today.  - Hemoglobin A1c - Insulin, random  - metFORMIN (GLUCOPHAGE) 500 MG tablet; 1/2 tab with breakfast 1/2 with lunch  Dispense: 30 tablet; Refill: 0  3. Vitamin D deficiency Low Vitamin D level contributes to fatigue and are associated with obesity, breast, and colon cancer. He agrees to continue to take prescription Vitamin D @50 ,000 IU every week and will follow-up for routine testing of Vitamin D, at least 2-3 times per year to avoid over-replacement.Check labs today.  - VITAMIN D 25 Hydroxy (Vit-D Deficiency, Fractures)  4. At risk for complication  associated with hypotension Tony [name] was given approximately 15 minutes of education and counseling today to help avoid hypotension. We discussed risks of hypotension with weight loss and signs of hypotension such as feeling lightheaded or unsteady.  Repetitive spaced learning was employed today to elicit superior memory formation and behavioral  change.  5. Obesity with current BMI 37.1  Tony [name] is currently in the action stage of change. As such, his goal is to continue with weight loss efforts. He has agreed to keeping a food journal and adhering to recommended goals of 1400-1700 calories and 100 g protein.   Exercise goals: As is  Behavioral modification strategies: increasing lean protein intake, decreasing simple carbohydrates, increasing water intake, meal planning and cooking strategies, better snacking choices, planning for success and keeping a strict food journal.  Tony [name] has agreed to follow-up with our clinic in 2 weeks. He was informed of the importance of frequent follow-up visits to maximize his success with intensive lifestyle modifications for his multiple health conditions.   Tony [name] was informed we would discuss his lab results at his next visit unless there is a critical issue that needs to be addressed sooner. Tony [name] agreed to keep his next visit at the agreed upon time to discuss these results.  Objective:   Blood pressure 99/63, pulse 63, temperature 97.7 F (36.5 C), height 5' 9"  (1.753 m), weight 251 lb (113.9 kg), SpO2 99 %. Body mass index is 37.07 kg/m.  General: Cooperative, alert, well developed, in no acute distress. HEENT: Conjunctivae and lids unremarkable. Cardiovascular: Regular rhythm.  Lungs: Normal work of breathing. Neurologic: No focal deficits.   Lab Results  Component Value Date   CREATININE 0.94 04/13/2020   BUN 13 04/13/2020   NA 139 04/13/2020   K 4.3 04/13/2020   CL 103 04/13/2020   CO2 19 (L) 04/13/2020   Lab Results  Component Value Date   ALT 50 (H) 04/13/2020   AST 38 04/13/2020   ALKPHOS 72 04/13/2020   BILITOT 0.3 04/13/2020   Lab Results  Component Value Date   HGBA1C 5.9 03/22/2020   HGBA1C 6.2 07/02/2019   HGBA1C 5.8 11/13/2018   HGBA1C 5.6 06/24/2018   HGBA1C 6.0 10/20/2017   Lab Results  Component Value Date   INSULIN 35.9 (H) 04/13/2020   Lab  Results  Component Value Date   TSH 1.88 03/22/2020   Lab Results  Component Value Date   CHOL 174 04/13/2020   HDL 43 04/13/2020   LDLCALC 98 04/13/2020   LDLDIRECT 157.0 07/02/2019   TRIG 192 (H) 04/13/2020   CHOLHDL 4 03/22/2020   Lab Results  Component Value Date   WBC 5.8 03/22/2020   HGB 15.5 03/22/2020   HCT 46.0 03/22/2020   MCV 93.4 03/22/2020   PLT 141.0 (L) 03/22/2020   No results found for: IRON, TIBC, FERRITIN   Attestation Statements:   Reviewed by clinician on day of visit: allergies, medications, problem list, medical history, surgical history, family history, social history, and previous encounter notes.  Coral Ceo, CMA, am acting as transcriptionist for Mina Marble, NP.  I have reviewed the above documentation for accuracy and completeness, and I agree with the above. -  Niemah Schwebke d. Casilda Pickerill, NP-C

## 2020-07-11 LAB — VITAMIN D 25 HYDROXY (VIT D DEFICIENCY, FRACTURES): Vit D, 25-Hydroxy: 47.1 ng/mL (ref 30.0–100.0)

## 2020-07-11 LAB — HEMOGLOBIN A1C
Est. average glucose Bld gHb Est-mCnc: 111 mg/dL
Hgb A1c MFr Bld: 5.5 % (ref 4.8–5.6)

## 2020-07-11 LAB — INSULIN, RANDOM: INSULIN: 14 u[IU]/mL (ref 2.6–24.9)

## 2020-07-12 ENCOUNTER — Telehealth: Payer: Self-pay | Admitting: Family Medicine

## 2020-07-12 ENCOUNTER — Other Ambulatory Visit: Payer: Self-pay

## 2020-07-12 ENCOUNTER — Ambulatory Visit (INDEPENDENT_AMBULATORY_CARE_PROVIDER_SITE_OTHER): Payer: BC Managed Care – PPO | Admitting: Psychology

## 2020-07-12 ENCOUNTER — Encounter: Payer: Self-pay | Admitting: Family Medicine

## 2020-07-12 DIAGNOSIS — F411 Generalized anxiety disorder: Secondary | ICD-10-CM | POA: Diagnosis not present

## 2020-07-12 DIAGNOSIS — M5416 Radiculopathy, lumbar region: Secondary | ICD-10-CM

## 2020-07-12 NOTE — Telephone Encounter (Addendum)
.  FYI. Please see below.  Spoke with pt regarding pain medication, unfortunately we would not be able to provide this for him. He has gabapentin on his medication list prescribed by Dr.Smith. Regarding his appt tomorrow, he wanted to discuss changes with eszopiclone and is due for controlled agreement update. He understood this but wanted to reschedule. He would call back to r/s

## 2020-07-12 NOTE — Telephone Encounter (Signed)
Noted  

## 2020-07-12 NOTE — Telephone Encounter (Signed)
Patient is calling to see if he can get some pain medication to hold him over until he gets his epidural tomorrow at 8:30. He also wants to know if he needs to reschedule his appointment. Please call him back on [phone] and advise.

## 2020-07-13 ENCOUNTER — Emergency Department (HOSPITAL_BASED_OUTPATIENT_CLINIC_OR_DEPARTMENT_OTHER)
Admission: EM | Admit: 2020-07-13 | Discharge: 2020-07-13 | Disposition: A | Discharge status: Home / Self Care | Payer: BC Managed Care – PPO | Attending: Emergency Medicine | Admitting: Emergency Medicine

## 2020-07-13 ENCOUNTER — Encounter (HOSPITAL_BASED_OUTPATIENT_CLINIC_OR_DEPARTMENT_OTHER): Payer: Self-pay | Admitting: *Deleted

## 2020-07-13 ENCOUNTER — Inpatient Hospital Stay: Admission: RE | Admit: 2020-07-13 | Payer: BC Managed Care – PPO | Source: Ambulatory Visit

## 2020-07-13 ENCOUNTER — Emergency Department (HOSPITAL_BASED_OUTPATIENT_CLINIC_OR_DEPARTMENT_OTHER): Payer: BC Managed Care – PPO

## 2020-07-13 ENCOUNTER — Ambulatory Visit: Payer: BC Managed Care – PPO | Admitting: Family Medicine

## 2020-07-13 ENCOUNTER — Other Ambulatory Visit: Payer: Self-pay

## 2020-07-13 DIAGNOSIS — E119 Type 2 diabetes mellitus without complications: Secondary | ICD-10-CM | POA: Diagnosis not present

## 2020-07-13 DIAGNOSIS — Z7984 Long term (current) use of oral hypoglycemic drugs: Secondary | ICD-10-CM | POA: Insufficient documentation

## 2020-07-13 DIAGNOSIS — R509 Fever, unspecified: Secondary | ICD-10-CM | POA: Diagnosis not present

## 2020-07-13 DIAGNOSIS — B349 Viral infection, unspecified: Secondary | ICD-10-CM | POA: Insufficient documentation

## 2020-07-13 DIAGNOSIS — Z20822 Contact with and (suspected) exposure to covid-19: Secondary | ICD-10-CM | POA: Insufficient documentation

## 2020-07-13 DIAGNOSIS — Z79899 Other long term (current) drug therapy: Secondary | ICD-10-CM | POA: Insufficient documentation

## 2020-07-13 DIAGNOSIS — M545 Low back pain, unspecified: Secondary | ICD-10-CM | POA: Diagnosis not present

## 2020-07-13 DIAGNOSIS — I10 Essential (primary) hypertension: Secondary | ICD-10-CM | POA: Diagnosis not present

## 2020-07-13 DIAGNOSIS — R6883 Chills (without fever): Secondary | ICD-10-CM | POA: Diagnosis not present

## 2020-07-13 LAB — URINALYSIS, ROUTINE W REFLEX MICROSCOPIC
Bilirubin Urine: NEGATIVE
Glucose, UA: NEGATIVE mg/dL
Hgb urine dipstick: NEGATIVE
Ketones, ur: NEGATIVE mg/dL
Leukocytes,Ua: NEGATIVE
Nitrite: NEGATIVE
Protein, ur: NEGATIVE mg/dL
Specific Gravity, Urine: 1.018 (ref 1.005–1.030)
pH: 5.5 (ref 5.0–8.0)

## 2020-07-13 LAB — RESP PANEL BY RT-PCR (FLU A&B, COVID) ARPGX2
Influenza A by PCR: NEGATIVE
Influenza B by PCR: NEGATIVE
SARS Coronavirus 2 by RT PCR: NEGATIVE

## 2020-07-13 MED ORDER — TRAMADOL HCL 50 MG PO TABS: ORAL_TABLET | ORAL | 0 refills | Status: DC

## 2020-07-13 MED ORDER — METHOCARBAMOL 750 MG PO TABS: 750.0000 mg | ORAL_TABLET | Freq: Four times a day (QID) | ORAL | 0 refills | Status: DC

## 2020-07-13 MED ORDER — ACETAMINOPHEN 500 MG PO TABS
1000.0000 mg | ORAL_TABLET | Freq: Once | ORAL | Status: AC
  Administered 2020-07-13: 1000 mg via ORAL
  Filled 2020-07-13: qty 2

## 2020-07-13 NOTE — ED Provider Notes (Signed)
[facility] EMERGENCY DEPT Provider Note   CSN: [id number] Arrival date & time: 07/13/20  1040     History Chief Complaint  Patient presents with  . Chills  . Bodyaches  . Fever    Tony [name] is a 45 y.o. male.  HPI Patient reports that he has been having ongoing problems with chronic lower back pain.  Typically his low back pain is on the right.  He however got a steroid injection and was significantly better.  He reports normally his left low back did not bother him but now over the past several days he has had significantly increased pain in the left lower back.  He indicates the area over the SI joint.  Patient was scheduled by his sports medicine physician this morning to get a injection at 830.  The patient reports however he started to feel ill and yesterday was kind of fatigued achy and had a temperature at 99.3.  He reports he took a home COVID test that was negative.  No cough or shortness of breath.  He reports he has had some abdominal discomfort and has been constipated.  Reports he had to strain at stool an awful lot yesterday and once he finally had a bowel movement there was some bright red bleeding.  That has not persisted.  He has not gone on to have any diarrheal stool.  No abdominal pain.  He reports over the last couple of days he was getting episodes of his left low back being so painful that he had to crawl around on the floor.  He has taken meloxicam and its improved he is up and walking again.    Past Medical History:  Diagnosis Date  . Abnormal weight gain 05/25/2015  . Alcohol abuse 2016/2017   Pt states he was self medicating his anxiety.  Quit 04/2015.  Minimal intake as of 05/2017 f/u.  Marland Kitchen Anal fissure   . Anxiety   . Anxiety   . Back pain   . Depression   . Fatty liver   . Hepatic steatosis 2015; 2016; 02/2016   +Alcohol has played a role.  CT abd showed fatty liver+ liver enzymes mildly elevated (chron viral hepatitis screening NEG).  Abd  u/s 03/26/16 showed fatty liver, small GB polyps, o/w normal.  . History of kidney stones    [facility]  . HTN (hypertension)   . Hx of colonic polyp - ssp 02/11/2014   No polyps 02/2020->recall 10 yrs  . Hyperlipidemia    Statin started 05/2018  . Insomnia   . Obesity, Class II, BMI 35-39.9   . Psoriasis   . Secondary male hypogonadism    clomiphene 06/2019: normalized on clomiphene 08/2019  . Shortness of breath on exertion     Patient Active Problem List   Diagnosis Date Noted  . Other fatigue 04/13/2020  . Shortness of breath on exertion 04/13/2020  . Screening for depression 04/13/2020  . Vitamin D deficiency 04/13/2020  . At risk for heart disease 04/13/2020  . Diabetes mellitus ([place]) 04/13/2020  . Pain in finger of left hand 04/05/2020  . Nonallopathic lesion of sacral region 10/19/2019  . Nonallopathic lesion of lumbar region 10/19/2019  . Nonallopathic lesion of thoracic region 10/19/2019  . Degenerative disc disease, lumbar 09/29/2019  . Morbid obesity ([place]) 09/09/2019  . Abnormal weight gain 05/25/2015  . Hyperlipidemia, mixed 05/25/2015  . Abdominal pain, chronic, right lower quadrant 01/13/2014  . Hypertension associated with diabetes ([place]) 12/08/2009  .  BACK PAIN 12/08/2009  . INSOMNIA 01/18/2008  . GAD (generalized anxiety disorder) 09/04/2007  . SORE THROAT 09/04/2007  . INTERMITTENT VERTIGO 03/09/2007  . ELEVATED BLOOD PRESSURE WITHOUT DIAGNOSIS OF HYPERTENSION 03/09/2007    Past Surgical History:  Procedure Laterality Date  . APPENDECTOMY    . COLONOSCOPY  03/10/2020   No polyps. Recall 2032.  Marland Kitchen COLONOSCOPY W/ BIOPSIES  02/03/14   Cecal polyp (sessile serrated polyp w/out dysplasia) and posterior anal fissure; repeat TCS 2020 per Dr. Carlean Purl  . HAND SURGERY Left 2001   broke left pinkie finger  . LAPAROSCOPIC APPENDECTOMY N/A 02/24/2015   Procedure: APPENDECTOMY LAPAROSCOPIC;  Surgeon: Alphonsa Overall, MD;  Location: [facility];  Service: General;   Laterality: N/A;  . right ankle     2014       Family History  Problem Relation Age of Onset  . Diabetes Mother   . Restless legs syndrome Mother   . Heart disease Mother   . Heart attack Mother 11  . Hypertension Mother   . Hyperlipidemia Mother   . Depression Mother   . Anxiety disorder Mother   . Sleep apnea Mother   . Psoriasis Father   . Sleep apnea Father   . Stroke Neg Hx   . Cancer Neg Hx   . Colon cancer Neg Hx   . Colon polyps Neg Hx   . Esophageal cancer Neg Hx   . Rectal cancer Neg Hx   . Stomach cancer Neg Hx     Social History   Tobacco Use  . Smoking status: Never Smoker  . Smokeless tobacco: Never Used  Vaping Use  . Vaping Use: Never used  Substance Use Topics  . Alcohol use: Not Currently    Alcohol/week: 0.0 standard drinks    Comment: 2019 stopped alcohol  . Drug use: No    Home Medications Prior to Admission medications   Medication Sig Start Date End Date Taking? Authorizing Provider  carbamazepine (TEGRETOL) 100 MG chewable tablet TAKE 1 TABLET BY MOUTH IN THE MORNING THEN TAKE 2 TABLETS BY MOUTH AT BEDTIME 05/22/18  Yes [provider]  clonazePAM (KLONOPIN) 1 MG tablet Take 1 mg by mouth 3 (three) times daily as needed for anxiety.   Yes [provider]  gabapentin (NEURONTIN) 100 MG capsule Take 2 capsules (200 mg total) by mouth at bedtime. 09/29/19  Yes Hulan Saas M, DO  lisinopril (ZESTRIL) 10 MG tablet TAKE ONE TABLET BY MOUTH DAILY 05/29/20  Yes McGowen, Adrian Blackwater, MD  meloxicam (MOBIC) 15 MG tablet Take 15 mg by mouth daily as needed for pain.   Yes [provider]  metFORMIN (GLUCOPHAGE) 500 MG tablet 1/2 tab with breakfast 1/2 with lunch 07/10/20  Yes Danford, Valetta Fuller D, NP  methocarbamol (ROBAXIN-750) 750 MG tablet Take 1 tablet (750 mg total) by mouth 4 (four) times daily. 07/13/20  Yes Shanequia Kendrick, Jeannie Done, MD  polyethylene glycol (MIRALAX / GLYCOLAX) 17 g packet Take 17 g by mouth daily as needed. 04/27/20  Yes  Beasley, Caren D, MD  propranolol (INDERAL) 10 MG tablet Take 10 mg by mouth 2 (two) times daily.   Yes [provider]  rosuvastatin (CRESTOR) 5 MG tablet TAKE ONE TABLET BY MOUTH DAILY 03/31/20  Yes McGowen, Adrian Blackwater, MD  sertraline (ZOLOFT) 25 MG tablet Take 12.5 mg by mouth daily. 05/22/18  Yes [provider]  traMADol (ULTRAM) 50 MG tablet 1-2 tablets every 6 hours as needed for pain 07/13/20  Yes Eulia Hatcher,  Jeannie Done, MD  fluticasone (CUTIVATE) 0.05 % cream Apply to affected areas bid prn 09/09/19   McGowen, Adrian Blackwater, MD  Vitamin D, Ergocalciferol, (DRISDOL) 1.25 MG (50000 UNIT) CAPS capsule TAKE ONE CAPSULE BY MOUTH EVERY 7 DAYS 05/12/20   Lyndal Pulley, DO    Allergies    Patient has no known allergies.  Review of Systems   Review of Systems 10 systems reviewed and negative except as per HPI Physical Exam Updated Vital Signs BP 119/71 (BP Location: Right Arm)   Pulse 60   Temp 99.4 F (37.4 C) (Oral)   Resp 16   Ht 5' 9.5" (1.765 m)   Wt 114.3 kg   SpO2 99%   BMI 36.68 kg/m   Physical Exam Constitutional:      Comments: Patient is alert and well in appearance.  No respiratory distress.  Mental status clear.  HENT:     Head: Normocephalic and atraumatic.     Mouth/Throat:     Mouth: Mucous membranes are moist.     Pharynx: Oropharynx is clear.  Eyes:     Extraocular Movements: Extraocular movements intact.     Conjunctiva/sclera: Conjunctivae normal.  Cardiovascular:     Rate and Rhythm: Normal rate and regular rhythm.  Pulmonary:     Effort: Pulmonary effort is normal.     Breath sounds: Normal breath sounds.  Abdominal:     General: There is no distension.     Palpations: Abdomen is soft.     Tenderness: There is no abdominal tenderness.  Musculoskeletal:        General: No swelling or tenderness. Normal range of motion.     Cervical back: Neck supple.     Right lower leg: No edema.     Left lower leg: No edema.     Comments: No CVA tenderness.   No focal reproducible pain on the back.  Patient does have an area on the left SI joint that he indicates is area of his pain.  At this time he is mobile without difficulty.  He can move both lower extremities and ambulate.  No peripheral edema.  Skin condition very good.  No signs of cellulitis.  Calf soft and nontender.  Skin:    General: Skin is warm and dry.  Neurological:     General: No focal deficit present.     Mental Status: He is oriented to person, place, and time.     Cranial Nerves: No cranial nerve deficit.     Motor: No weakness.     Coordination: Coordination normal.  Psychiatric:        Mood and Affect: Mood normal.     ED Results / Procedures / Treatments   Labs (all labs ordered are listed, but only abnormal results are displayed) Labs Reviewed  RESP PANEL BY RT-PCR (FLU A&B, COVID) [id number]  URINALYSIS, ROUTINE W REFLEX MICROSCOPIC    EKG None  Radiology DG Chest Port 1 View  Result Date: 07/13/2020 CLINICAL DATA:  45 year old male with fever, chills and body aches since 1600 hours yesterday. EXAM: PORTABLE CHEST 1 VIEW COMPARISON:  Chest radiographs 05/06/2017 and earlier. FINDINGS: Portable AP upright view at 1132 hours. Lung volumes and mediastinal contours are normal. Visualized tracheal air column is within normal limits. Allowing for portable technique the lungs are clear. No pneumothorax or pleural effusion. No osseous abnormality identified. IMPRESSION: Negative portable chest. Electronically Signed   By: Genevie Ann M.D.   On: 07/13/2020 11:59  Procedures Procedures   Medications Ordered in ED Medications  acetaminophen (TYLENOL) tablet 1,000 mg (has no administration in time range)  acetaminophen (TYLENOL) tablet 1,000 mg (1,000 mg Oral Given 07/13/20 1125)    ED Course  I have reviewed the triage vital signs and the nursing notes.  Pertinent labs & imaging results that were available during my care of the patient were reviewed by me and considered  in my medical decision making (see chart for details).    MDM Rules/Calculators/A&P                          Patient has history of chronic lower back pain.  He was due for an epidural injection on the left this morning.  Patient's pain is not suggestive of discitis or kidney stone.  Pain is quite local over the SI joint.  Patient is very mobile at this time.  He is nontoxic and well in appearance.  Urinalysis negative without blood.  Patient started with some generalized perception of malaise and achiness that he felt was flulike symptoms yesterday.  Measured T-max is 99.9.  Patient otherwise has normal exam.  COVID and influenza are negative.  Chest x-ray is clear.  Urinalysis normal without signs of infection or blood.  At this time, I suspect viral syndrome superimposed on chronic low back pain.  plan will be for pain control with patient's already prescribed meloxicam plus as needed acetaminophen, Robaxin and tramadol.  Close follow-up within the next 4 days recommended. Final Clinical Impression(s) / ED Diagnoses Final diagnoses:  Nonspecific syndrome suggestive of viral illness  Acute left-sided low back pain without sciatica    Rx / DC Orders ED Discharge Orders         Ordered    methocarbamol (ROBAXIN-750) 750 MG tablet  4 times daily        07/13/20 1405    traMADol (ULTRAM) 50 MG tablet        07/13/20 1405           Charlesetta Shanks, MD 07/13/20 1418

## 2020-07-13 NOTE — Discharge Instructions (Signed)
1.  Continue your meloxicam as prescribed for pain.  You may also take extra strength Tylenol every 6 hours with meloxicam for pain control.  You have also been prescribed Robaxin as a muscle relaxer.  You may use this for back pain.  If you need additional pain control, you may take 1-2 tramadol tablets. 2.  At this time your symptoms are suggestive of a viral illness.  There is no signs of urinary tract infection, blood in the urine, your COVID and influenza tests are negative.  Your chest x-ray does not show any signs of pneumonia.  Continue to rest and hydrate at home. 3.  See your doctor for recheck within the next 4 to 5 days.  Return to the emergency department if you develop worsening or concerning symptoms

## 2020-07-13 NOTE — ED Notes (Signed)
Patient reminded of need for urine sample. Patient reports he needs oral hydration. MD approved for patient to have water. Patient given x3 cups of water

## 2020-07-13 NOTE — ED Triage Notes (Signed)
Chills, body aches and fever of 99.9 yesterday.

## 2020-07-14 ENCOUNTER — Ambulatory Visit
Admission: RE | Admit: 2020-07-14 | Discharge: 2020-07-14 | Disposition: A | Discharge status: Home / Self Care | Payer: BC Managed Care – PPO | Source: Ambulatory Visit | Attending: Family Medicine | Admitting: Family Medicine

## 2020-07-14 DIAGNOSIS — M5416 Radiculopathy, lumbar region: Secondary | ICD-10-CM

## 2020-07-14 DIAGNOSIS — M47817 Spondylosis without myelopathy or radiculopathy, lumbosacral region: Secondary | ICD-10-CM | POA: Diagnosis not present

## 2020-07-14 MED ORDER — METHYLPREDNISOLONE ACETATE 40 MG/ML INJ SUSP (RADIOLOG
80.0000 mg | Freq: Once | INTRAMUSCULAR | Status: AC
  Administered 2020-07-14: 80 mg via EPIDURAL

## 2020-07-14 MED ORDER — IOPAMIDOL (ISOVUE-M 200) INJECTION 41%
1.0000 mL | Freq: Once | INTRAMUSCULAR | Status: AC
  Administered 2020-07-14: 1 mL via EPIDURAL

## 2020-07-14 NOTE — Discharge Instructions (Signed)
Post Procedure Spinal Discharge Instruction Sheet  1. You may resume a regular diet and any medications that you routinely take (including pain medications).  2. No driving day of procedure.  3. Light activity throughout the rest of the day.  Do not do any strenuous work, exercise, bending or lifting.  The day following the procedure, you can resume normal physical activity but you should refrain from exercising or physical therapy for at least three days thereafter.   Common Side Effects:   Headaches- take your usual medications as directed by your physician.  Increase your fluid intake.  Caffeinated beverages may be helpful.  Lie flat in bed until your headache resolves.   Restlessness or inability to sleep- you may have trouble sleeping for the next few days.  Ask your referring physician if you need any medication for sleep.   Facial flushing or redness- should subside within a few days.   Increased pain- a temporary increase in pain a day or two following your procedure is not unusual.  Take your pain medication as prescribed by your referring physician.   Leg cramps  Please contact our office at [phone] for the following symptoms:  Fever greater than 100 degrees.  Headaches unresolved with medication after 2-3 days.  Increased swelling, pain, or redness at injection site.   Thank you for visiting [facility] today.  

## 2020-07-24 ENCOUNTER — Other Ambulatory Visit: Payer: Self-pay

## 2020-07-24 ENCOUNTER — Emergency Department (HOSPITAL_BASED_OUTPATIENT_CLINIC_OR_DEPARTMENT_OTHER)
Admission: EM | Admit: 2020-07-24 | Discharge: 2020-07-24 | Disposition: A | Discharge status: Home / Self Care | Payer: BC Managed Care – PPO | Attending: Emergency Medicine | Admitting: Emergency Medicine

## 2020-07-24 ENCOUNTER — Telehealth: Payer: BC Managed Care – PPO | Admitting: Physician Assistant

## 2020-07-24 ENCOUNTER — Encounter (HOSPITAL_BASED_OUTPATIENT_CLINIC_OR_DEPARTMENT_OTHER): Payer: Self-pay | Admitting: *Deleted

## 2020-07-24 ENCOUNTER — Other Ambulatory Visit (INDEPENDENT_AMBULATORY_CARE_PROVIDER_SITE_OTHER): Payer: Self-pay | Admitting: Adult Health

## 2020-07-24 DIAGNOSIS — Z7984 Long term (current) use of oral hypoglycemic drugs: Secondary | ICD-10-CM | POA: Diagnosis not present

## 2020-07-24 DIAGNOSIS — I10 Essential (primary) hypertension: Secondary | ICD-10-CM | POA: Insufficient documentation

## 2020-07-24 DIAGNOSIS — Z79899 Other long term (current) drug therapy: Secondary | ICD-10-CM | POA: Insufficient documentation

## 2020-07-24 DIAGNOSIS — U071 COVID-19: Secondary | ICD-10-CM

## 2020-07-24 DIAGNOSIS — E1169 Type 2 diabetes mellitus with other specified complication: Secondary | ICD-10-CM

## 2020-07-24 DIAGNOSIS — E119 Type 2 diabetes mellitus without complications: Secondary | ICD-10-CM | POA: Insufficient documentation

## 2020-07-24 MED ORDER — BENZONATATE 100 MG PO CAPS: 100.0000 mg | ORAL_CAPSULE | Freq: Three times a day (TID) | ORAL | 0 refills | Status: DC | PRN

## 2020-07-24 MED ORDER — FLUTICASONE PROPIONATE 50 MCG/ACT NA SUSP: 2.0000 | Freq: Every day | NASAL | 0 refills | Status: DC

## 2020-07-24 MED ORDER — IBUPROFEN 400 MG PO TABS
600.0000 mg | ORAL_TABLET | Freq: Once | ORAL | Status: AC
  Administered 2020-07-24: 600 mg via ORAL
  Filled 2020-07-24: qty 1

## 2020-07-24 MED ORDER — ALBUTEROL SULFATE HFA 108 (90 BASE) MCG/ACT IN AERS
2.0000 | INHALATION_SPRAY | Freq: Four times a day (QID) | RESPIRATORY_TRACT | 0 refills | Status: DC | PRN
Start: 2020-07-24 — End: 2021-04-23

## 2020-07-24 NOTE — Discharge Instructions (Signed)
Please read instructions below.  You can alternate Tylenol/acetaminophen and Advil/ibuprofen/Motrin every 4 hours for sore throat, body aches, headache or fever.  Drink plenty of water.  Use saline nasal spray for congestion. You can take the Tessalon as prescribed at your visit today for cough.Tony [name] your hands frequently. Isolate at home for at least 5 days after the day your symptoms initially began, and THEN at least 24 hours after you are fever-free without the help of medications AND your symptoms are improving. Only once your symptoms are improving and you are fever-free can you come out out of quarantine. Once, then you should wear a mask in public for another 5 days. Follow up with your primary care provider. Return to the ER for significant shortness of breath, uncontrollable vomiting, severe chest pain, or other concerning symptoms.

## 2020-07-24 NOTE — Progress Notes (Signed)
E-Visit for Positive Covid Test Result We are sorry you are not feeling well. We are here to help!  You have tested positive for COVID-19, meaning that you were infected with the novel coronavirus and could give the virus to others.  It is vitally important that you stay home so you do not spread it to others.      Please continue isolation at home, for at least 10 days since the start of your symptoms and until you have had 24 hours with no fever (without taking a fever reducer) and with improving of symptoms.  If you have no symptoms but tested positive (or all symptoms resolve after 5 days and you have no fever) you can leave your house but continue to wear a mask around others for an additional 5 days. If you have a fever,continue to stay home until you have had 24 hours of no fever. Most cases improve 5-10 days from onset but we have seen a small number of patients who have gotten worse after the 10 days.  Please be sure to watch for worsening symptoms and remain taking the proper precautions.   Go to the nearest hospital ED for assessment if fever/cough/breathlessness are severe or illness seems like a threat to life.    The following symptoms may appear 2-14 days after exposure: . Fever . Cough . Shortness of breath or difficulty breathing . Chills . Repeated shaking with chills . Muscle pain . Headache . Sore throat . New loss of taste or smell . Fatigue . Congestion or runny nose . Nausea or vomiting . Diarrhea  You have been enrolled in [place] for COVID-19. Daily you will receive a questionnaire within the [facility] website. Our COVID-19 response team will be monitoring your responses daily.  You can use medication such as prescription cough medication called Tessalon Perles 100 mg. You may take 1-2 capsules every 8 hours as needed for cough,  prescription inhaler called Albuterol MDI 90 mcg /actuation 2 puffs every 4 hours as needed for shortness of breath,  wheezing, cough and prescription for Fluticasone nasal spray 2 sprays in each nostril one time per day  You may also take acetaminophen (Tylenol) as needed for fever.  If you are desiring the antiviral treatment options, we do require a video visit or in-person visit for prescribing these medications. Currently, our video visit platform is down. If needed you could seek care at your local urgent care for consideration of the antiviral medications. We are hoping our video platform will be up by tomorrow. If you can wait, you can try to get a video appointment tomorrow. You would just select the Virtual Urgent Care Visit in the [facility] menu.   HOME CARE: . Only take medications as instructed by your medical team. . Drink plenty of fluids and get plenty of rest. . A steam or ultrasonic humidifier can help if you have congestion.   GET HELP RIGHT AWAY IF YOU HAVE EMERGENCY WARNING SIGNS.  Call 911 or proceed to your closest emergency facility if: . You develop worsening high fever. . Trouble breathing . Bluish lips or face . Persistent pain or pressure in the chest . New confusion . Inability to wake or stay awake . You cough up blood. . Your symptoms become more severe . Inability to hold down food or fluids  This list is not all possible symptoms. Contact your medical provider for any symptoms that are severe or concerning to you.  Your e-visit answers were reviewed by a board certified advanced clinical practitioner to complete your personal care plan.  Depending on the condition, your plan could have included both over the counter or prescription medications.  If there is a problem please reply once you have received a response from your provider.  Your safety is important to [place].  If you have drug allergies check your prescription carefully.    You can use MyChart to ask questions about today's visit, request a non-urgent call back, or ask for a work or school excuse for 24 hours  related to this e-Visit. If it has been greater than 24 hours you will need to follow up with your provider, or enter a new e-Visit to address those concerns. You will get an e-mail in the next two days asking about your experience.  I hope that your e-visit has been valuable and will speed your recovery. Thank you for using e-visits.  I provided 6 minutes of non face-to-face time during this encounter for chart review and documentation.

## 2020-07-24 NOTE — ED Provider Notes (Signed)
[facility] EMERGENCY DEPARTMENT Provider Note   CSN: [id number] Arrival date & time: 07/24/20  2031     History No chief complaint on file.   Tony [name] is a 45 y.o. male past medical history of prediabetes, controlled hypertension, presenting to the emergency department with positive home COVID test today.  He states he presents for evaluation because his fever of 102.1 F.  He treated with Tylenol prior to arrival.  He is complaining of cough, congestion, body aches.  Had some upset stomach without vomiting.  No shortness of breath.  Wife is also positive for COVID.  He did an E-visit today with [facility], however was told at some point that the system went down, and then he was notified that he had prescription sent to the pharmacy.  This caused him some confusion because he did not speak with any provider.  However it appears he did the online Evisits through MyChart - he was unaware that there would be no telephone or video chat. He is vaccinated and boosted against COVID.  He is unsure of what he could take to treat his symptoms.  Wonders if [employer] and DayQuil are appropriate.  Per chart review, he was prescribed Tessalon, albuterol, and Flonase.  The history is provided by the patient.       Past Medical History:  Diagnosis Date  . Abnormal weight gain 05/25/2015  . Alcohol abuse 2016/2017   Pt states he was self medicating his anxiety.  Quit 04/2015.  Minimal intake as of 05/2017 f/u.  Marland Kitchen Anal fissure   . Anxiety   . Anxiety   . Back pain   . Depression   . Fatty liver   . Hepatic steatosis 2015; 2016; 02/2016   +Alcohol has played a role.  CT abd showed fatty liver+ liver enzymes mildly elevated (chron viral hepatitis screening NEG).  Abd u/s 03/26/16 showed fatty liver, small GB polyps, o/w normal.  . History of kidney stones    [facility]  . HTN (hypertension)   . Hx of colonic polyp - ssp 02/11/2014   No polyps 02/2020->recall 10 yrs  . Hyperlipidemia     Statin started 05/2018  . Insomnia   . Obesity, Class II, BMI 35-39.9   . Psoriasis   . Secondary male hypogonadism    clomiphene 06/2019: normalized on clomiphene 08/2019  . Shortness of breath on exertion     Patient Active Problem List   Diagnosis Date Noted  . Other fatigue 04/13/2020  . Shortness of breath on exertion 04/13/2020  . Screening for depression 04/13/2020  . Vitamin D deficiency 04/13/2020  . At risk for heart disease 04/13/2020  . Diabetes mellitus ([place]) 04/13/2020  . Pain in finger of left hand 04/05/2020  . Nonallopathic lesion of sacral region 10/19/2019  . Nonallopathic lesion of lumbar region 10/19/2019  . Nonallopathic lesion of thoracic region 10/19/2019  . Degenerative disc disease, lumbar 09/29/2019  . Morbid obesity ([place]) 09/09/2019  . Abnormal weight gain 05/25/2015  . Hyperlipidemia, mixed 05/25/2015  . Abdominal pain, chronic, right lower quadrant 01/13/2014  . Hypertension associated with diabetes ([place]) 12/08/2009  . BACK PAIN 12/08/2009  . INSOMNIA 01/18/2008  . GAD (generalized anxiety disorder) 09/04/2007  . SORE THROAT 09/04/2007  . INTERMITTENT VERTIGO 03/09/2007  . ELEVATED BLOOD PRESSURE WITHOUT DIAGNOSIS OF HYPERTENSION 03/09/2007    Past Surgical History:  Procedure Laterality Date  . APPENDECTOMY    . COLONOSCOPY  03/10/2020   No polyps. Recall 2032.  Marland Kitchen  COLONOSCOPY W/ BIOPSIES  02/03/14   Cecal polyp (sessile serrated polyp w/out dysplasia) and posterior anal fissure; repeat TCS 2020 per Dr. Carlean Purl  . HAND SURGERY Left 2001   broke left pinkie finger  . LAPAROSCOPIC APPENDECTOMY N/A 02/24/2015   Procedure: APPENDECTOMY LAPAROSCOPIC;  Surgeon: Alphonsa Overall, MD;  Location: [facility];  Service: General;  Laterality: N/A;  . right ankle     2014       Family History  Problem Relation Age of Onset  . Diabetes Mother   . Restless legs syndrome Mother   . Heart disease Mother   . Heart attack Mother 55  . Hypertension Mother   .  Hyperlipidemia Mother   . Depression Mother   . Anxiety disorder Mother   . Sleep apnea Mother   . Psoriasis Father   . Sleep apnea Father   . Stroke Neg Hx   . Cancer Neg Hx   . Colon cancer Neg Hx   . Colon polyps Neg Hx   . Esophageal cancer Neg Hx   . Rectal cancer Neg Hx   . Stomach cancer Neg Hx     Social History   Tobacco Use  . Smoking status: Never Smoker  . Smokeless tobacco: Never Used  Vaping Use  . Vaping Use: Never used  Substance Use Topics  . Alcohol use: Not Currently    Alcohol/week: 0.0 standard drinks    Comment: 2019 stopped alcohol  . Drug use: No    Home Medications Prior to Admission medications   Medication Sig Start Date End Date Taking? Authorizing Provider  albuterol (VENTOLIN HFA) 108 (90 Base) MCG/ACT inhaler Inhale 2 puffs into the lungs every 6 (six) hours as needed for wheezing or shortness of breath. 07/24/20   Mar Daring, PA-C  benzonatate (TESSALON) 100 MG capsule Take 1 capsule (100 mg total) by mouth 3 (three) times daily as needed. 07/24/20   Mar Daring, PA-C  carbamazepine (TEGRETOL) 100 MG chewable tablet TAKE 1 TABLET BY MOUTH IN THE MORNING THEN TAKE 2 TABLETS BY MOUTH AT BEDTIME 05/22/18   [provider]  clonazePAM (KLONOPIN) 1 MG tablet Take 1 mg by mouth 3 (three) times daily as needed for anxiety.    [provider]  fluticasone (CUTIVATE) 0.05 % cream Apply to affected areas bid prn 09/09/19   McGowen, Adrian Blackwater, MD  fluticasone (FLONASE) 50 MCG/ACT nasal spray Place 2 sprays into both nostrils daily. 07/24/20   Mar Daring, PA-C  gabapentin (NEURONTIN) 100 MG capsule Take 2 capsules (200 mg total) by mouth at bedtime. 09/29/19   Lyndal Pulley, DO  lisinopril (ZESTRIL) 10 MG tablet TAKE ONE TABLET BY MOUTH DAILY 05/29/20   McGowen, Adrian Blackwater, MD  meloxicam (MOBIC) 15 MG tablet Take 15 mg by mouth daily as needed for pain.    [provider]  metFORMIN (GLUCOPHAGE) 500 MG tablet  1/2 tab with breakfast 1/2 with lunch 07/10/20   Danford, Valetta Fuller D, NP  methocarbamol (ROBAXIN-750) 750 MG tablet Take 1 tablet (750 mg total) by mouth 4 (four) times daily. 07/13/20   Charlesetta Shanks, MD  polyethylene glycol (MIRALAX / GLYCOLAX) 17 g packet Take 17 g by mouth daily as needed. 04/27/20   Dennard Nip D, MD  propranolol (INDERAL) 10 MG tablet Take 10 mg by mouth 2 (two) times daily.    [provider]  rosuvastatin (CRESTOR) 5 MG tablet TAKE ONE TABLET BY MOUTH DAILY 03/31/20   McGowen, Arnette Norris  H, MD  sertraline (ZOLOFT) 25 MG tablet Take 12.5 mg by mouth daily. 05/22/18   [provider]  traMADol Veatrice Bourbon) 50 MG tablet 1-2 tablets every 6 hours as needed for pain 07/13/20   Charlesetta Shanks, MD  Vitamin D, Ergocalciferol, (DRISDOL) 1.25 MG (50000 UNIT) CAPS capsule TAKE ONE CAPSULE BY MOUTH EVERY 7 DAYS 05/12/20   Lyndal Pulley, DO    Allergies    Patient has no known allergies.  Review of Systems   Review of Systems  Constitutional: Positive for chills and fever.  HENT: Positive for rhinorrhea.   Respiratory: Positive for cough. Negative for shortness of breath.   Musculoskeletal: Positive for myalgias (Generalized).    Physical Exam Updated Vital Signs BP 114/74 (BP Location: Right Arm)   Pulse (!) 102   Temp 99.2 F (37.3 C) (Oral)   Resp 20   Ht 5' 9.5" (1.765 m)   Wt 116.2 kg   SpO2 97%   BMI 37.29 kg/m   Physical Exam Vitals and nursing note reviewed.  Constitutional:      Appearance: He is well-developed.  HENT:     Head: Normocephalic and atraumatic.  Eyes:     Conjunctiva/sclera: Conjunctivae normal.  Cardiovascular:     Rate and Rhythm: Normal rate and regular rhythm.  Pulmonary:     Effort: Pulmonary effort is normal. No respiratory distress.     Breath sounds: Normal breath sounds.  Abdominal:     General: Bowel sounds are normal.     Tenderness: There is no abdominal tenderness.  Musculoskeletal:     Cervical back: Normal  range of motion and neck supple.  Neurological:     Mental Status: He is alert.  Psychiatric:        Mood and Affect: Mood normal.        Behavior: Behavior normal.     ED Results / Procedures / Treatments   Labs (all labs ordered are listed, but only abnormal results are displayed) Labs Reviewed - No data to display  EKG None  Radiology No results found.  Procedures Procedures   Medications Ordered in ED Medications  ibuprofen (ADVIL) tablet 600 mg (600 mg Oral Given 07/24/20 2148)    ED Course  I have reviewed the triage vital signs and the nursing notes.  Pertinent labs & imaging results that were available during my care of the patient were reviewed by me and considered in my medical decision making (see chart for details).    MDM Rules/Calculators/A&P                          Patient is here with positive home COVID test today.  Had fever throughout the day with Tylenol, therefore presents for persistent fever.  He took fever prior to arrival and temp on arrival is 100 F.  He is well-appearing and in no distress.  Lungs are clear.  Informed patient of his prescriptions that have been sent in from [facility] earlier today.  Recommend alternate ibuprofen and Tylenol for fever and body aches.  Over-the-counter medications as needed.  He states he has a pulse ox at home.  Recommend he monitor this, follow-up outpatient.  Return precautions.  Tony [name] was evaluated in Emergency Department on 07/24/2020 for the symptoms described in the history of present illness. He was evaluated in the context of the global COVID-19 pandemic, which necessitated consideration that the patient might be at risk for infection with  the SARS-CoV-2 virus that causes COVID-19. Institutional protocols and algorithms that pertain to the evaluation of patients at risk for COVID-19 are in a state of rapid change based on information released by regulatory bodies including the CDC and federal and state  organizations. These policies and algorithms were followed during the patient's care in the ED.  Discussed results, findings, treatment and follow up. Patient advised of return precautions. Patient verbalized understanding and agreed with plan.  Final Clinical Impression(s) / ED Diagnoses Final diagnoses:  [id number]    Rx / DC Orders ED Discharge Orders    None       Keia Rask, [place] N, PA-C 07/24/20 2217    Wyvonnia Dusky, MD 07/25/20 [phone]

## 2020-07-24 NOTE — ED Triage Notes (Signed)
He had a positive home Covid test this am. Here with fever. He had Tylenol 30 minutes ago.

## 2020-07-25 ENCOUNTER — Encounter (INDEPENDENT_AMBULATORY_CARE_PROVIDER_SITE_OTHER): Payer: Self-pay | Admitting: Family Medicine

## 2020-07-25 ENCOUNTER — Telehealth: Payer: BC Managed Care – PPO | Admitting: Physician Assistant

## 2020-07-25 ENCOUNTER — Telehealth (INDEPENDENT_AMBULATORY_CARE_PROVIDER_SITE_OTHER): Payer: BC Managed Care – PPO | Admitting: Family Medicine

## 2020-07-25 DIAGNOSIS — U071 COVID-19: Secondary | ICD-10-CM | POA: Diagnosis not present

## 2020-07-25 DIAGNOSIS — E1169 Type 2 diabetes mellitus with other specified complication: Secondary | ICD-10-CM | POA: Diagnosis not present

## 2020-07-25 DIAGNOSIS — Z6839 Body mass index (BMI) 39.0-39.9, adult: Secondary | ICD-10-CM

## 2020-07-25 NOTE — Progress Notes (Signed)
Since your Covid symptoms started less than 5 days ago, please STOP. You may need a prescription for FDA-approved treatments (pills and IV therapy), which we cannot prescribe through an e-Visit. The best way for our providers to make a decision about which COVID treatment is right for you, is through a Virtual Urgent Care Visit. If you'd like to discuss COVID Therapy options with a provider, cancel this e-Visit and access a Virtual Urgent Care Visit from our [facility]. You will not be charged for this e-Visit.

## 2020-07-25 NOTE — Progress Notes (Signed)
Office: [phone]  /  Fax: [phone]    Date: August 07, 2020   Appointment Start Time: 8:01am Duration: 22 minutes Provider: Glennie Isle, Psy.D. Type of Session: Individual Therapy  Location of Patient: Home Location of Provider: Provider's home (private office) Type of Contact: Telepsychological Visit via MyChart Video Visit  Session Content: Jesselee is a 45 y.o. male presenting for a follow-up appointment to address the previously established treatment goal of increasing coping skills. Today's appointment was a telepsychological visit due to COVID-19. Verbon provided verbal consent for today's telepsychological appointment and he is aware he is responsible for securing confidentiality on his end of the session. Prior to proceeding with today's appointment, Chayse's physical location at the time of this appointment was obtained as well a phone number he could be reached at in the event of technical difficulties. [name] and this provider participated in today's telepsychological service.   This provider conducted a brief check-in. Rasul stated he was diagnosed with an unknown "virus" and then later COVID-19, adding his symptoms are starting to improve. He acknowledged challenges with eating congruent to his protein/calorie goals due to not feeling well, adding he discussed this with Dr. Leafy Ro during their last appointment. As he continues to feel better, he noted he has started to eat congruent to his goals. Recent challenges with sweet cravings were discussed and processed. This provider and [name] explored food options (e.g., protein bars, apples, yogurt) that can help with sweet cravings without turning to cookies, etc.  A plan was developed to help Christoher cope with emotional eating in the future using learned skills. He typed the following plan: focus on hydration; be prepared with snacks congruent to the meal plan; pause to ask questions when triggered to eat (e.g., Am I really  hungry?, Is there something bothering me?, and Will I feel better if I eat?); and engage in discussed coping strategies after going through the aforementioned questions. Overall, Virgle was receptive to today's appointment as evidenced by openness to sharing, responsiveness to feedback, and willingness to continue engaging in learned skills.  Mental Status Examination:  Appearance: well groomed and appropriate hygiene  Behavior: appropriate to circumstances Mood: euthymic Affect: mood congruent Speech: normal in rate, volume, and tone Eye Contact: appropriate Psychomotor Activity: unable to assess  Gait: unable to assess Thought Process: linear, logical, and goal directed  Thought Content/Perception: no hallucinations, delusions, bizarre thinking or behavior reported or observed and no evidence or endorsement of suicidal and homicidal ideation, plan, and intent Orientation: time, person, place, and purpose of appointment Memory/Concentration: memory, attention, language, and fund of knowledge intact  Insight/Judgment: good  Interventions:  Conducted a brief chart review Provided empathic reflections and validation Employed supportive psychotherapy interventions to facilitate reduced distress and to improve coping skills with identified stressors Engaged patient in problem solving Reviewed learned skills  DSM-5 Diagnosis(es): F32.89 Other Specified Depressive Disorder, Emotional Eating Behaviors and F41.9 Unspecified Anxiety Disorder  Treatment Goal & Progress: During the initial appointment with this provider, the following treatment goal was established: increase coping skills. Rielly demonstrated progress in his goal as evidenced by increased awareness of hunger patterns, increased awareness of triggers for emotional eating, and reduction in emotional eating. Timoth also continues to demonstrate willingness to engage in learned skill(s).  Plan: As previously planned, today was Marcelles's  last appointment with this provider. He acknowledged understanding that he may request a follow-up appointment with this provider in the future as long as he is still established with the clinic. No  further follow-up planned by this provider.

## 2020-07-26 ENCOUNTER — Ambulatory Visit: Payer: BC Managed Care – PPO | Admitting: Family Medicine

## 2020-07-26 MED ORDER — METFORMIN HCL 500 MG PO TABS: ORAL_TABLET | ORAL | 0 refills | Status: DC

## 2020-07-26 NOTE — Progress Notes (Signed)
TeleHealth Visit:  Due to the COVID-19 pandemic, this visit was completed with telemedicine (audio/video) technology to reduce patient and provider exposure as well as to preserve personal protective equipment.   Tony [name] has verbally consented to this TeleHealth visit. The patient is located at home, the provider is located at the [facility] office. The participants in this visit include the listed provider and patient. The visit was conducted today via MyChart video.   Chief Complaint: OBESITY Tony [name] is here to discuss his progress with his obesity treatment plan along with follow-up of his obesity related diagnoses. Tony [name] is on keeping a food journal and adhering to recommended goals of 1400-1700 calories and 100 grams of protein daily and states he is following his eating plan approximately 20% of the time. Tony [name] states he was doing cardio, weights, and walking 3 miles per day.   Today's visit was #: 8 Starting weight: 270 lbs Starting date: 04/13/2020  Interim History: [name] had a viral upper respiratory infection approximately 3 weeks ago (COVID test was negative), and he was recovering when he started having upper respiratory infection symptoms again 2 days ago. He and his wife tested positive for COVID, so today's visit was changed to virtual. He has had a decrease in appetite and has mostly only wanted simple carbohydrates. He notes a change in taste but it is not completely gone. Before getting sick, Tony [name] was doing well on his plan and with exercise.  Subjective:   1. Type 2 diabetes mellitus with other specified complication, without long-term current use of insulin (Tony [name]) Tony [name] had been doing well with diet and metformin before becoming ill. He is still tolerating metformin, but his diet has been off.  2. COVID-19 [name] is double vaccinated and boosted 1 time for COVID. He is coughing and he notes more green spit today with a fever of 102. He has not  contacted his primary care physician to discuss treatment options yet.  Assessment/Plan:   1. Type 2 diabetes mellitus with other specified complication, without long-term current use of insulin ([facility]) We will refill metformin for 1 month. Lenus will do his best on his eating until he feels better. Good blood sugar control is important to decrease the likelihood of diabetic complications such as nephropathy, neuropathy, limb loss, blindness, coronary artery disease, and death. Intensive lifestyle modification including diet, exercise and weight loss are the first line of treatment for diabetes.   - metFORMIN (GLUCOPHAGE) 500 MG tablet; 1/2 tab with breakfast 1/2 with lunch  Dispense: 30 tablet; Refill: 0  2. COVID-19 Tony [name] was encouraged to contact his primary care physician today to see what options he has for treatment. He agreed to so do after our virtual visit. Orders and follow up as documented in patient record.  3. Obesity with current BMI 37.07 Tony [name] is currently in the action stage of change. As such, his goal is to continue with weight loss efforts. He has agreed to keeping a food journal and adhering to recommended goals of 1400-1700 calories and 100+ grams of protein daily.   Tony [name] is to work on hydration and eat protein while he can. He will get back to his journaling plan when he feels better.  Exercise goals: Tony [name] is to hold off on exercise until he feels better.   Behavioral modification strategies: increasing water intake.  Tony [name] has agreed to follow-up with our clinic in 4 weeks. He was informed of the importance of frequent follow-up visits to maximize his  success with intensive lifestyle modifications for his multiple health conditions.  Objective:   VITALS: Per patient if applicable, see vitals. GENERAL: Alert and in no acute distress. CARDIOPULMONARY: No increased WOB. Speaking in clear sentences.  PSYCH: Pleasant and cooperative. Speech normal rate and rhythm.  Affect is appropriate. Insight and judgement are appropriate. Attention is focused, linear, and appropriate.  NEURO: Oriented as arrived to appointment on time with no prompting.   Lab Results  Component Value Date   CREATININE 0.94 04/13/2020   BUN 13 04/13/2020   NA 139 04/13/2020   K 4.3 04/13/2020   CL 103 04/13/2020   CO2 19 (L) 04/13/2020   Lab Results  Component Value Date   ALT 50 (H) 04/13/2020   AST 38 04/13/2020   ALKPHOS 72 04/13/2020   BILITOT 0.3 04/13/2020   Lab Results  Component Value Date   HGBA1C 5.5 07/10/2020   HGBA1C 5.9 03/22/2020   HGBA1C 6.2 07/02/2019   HGBA1C 5.8 11/13/2018   HGBA1C 5.6 06/24/2018   Lab Results  Component Value Date   INSULIN 14.0 07/10/2020   INSULIN 35.9 (H) 04/13/2020   Lab Results  Component Value Date   TSH 1.88 03/22/2020   Lab Results  Component Value Date   CHOL 174 04/13/2020   HDL 43 04/13/2020   LDLCALC 98 04/13/2020   LDLDIRECT 157.0 07/02/2019   TRIG 192 (H) 04/13/2020   CHOLHDL 4 03/22/2020   Lab Results  Component Value Date   WBC 5.8 03/22/2020   HGB 15.5 03/22/2020   HCT 46.0 03/22/2020   MCV 93.4 03/22/2020   PLT 141.0 (L) 03/22/2020   No results found for: IRON, TIBC, FERRITIN  Attestation Statements:   Reviewed by clinician on day of visit: allergies, medications, problem list, medical history, surgical history, family history, social history, and previous encounter notes.   I, Trixie Dredge, am acting as transcriptionist for Tony Nip, MD.  I have reviewed the above documentation for accuracy and completeness, and I agree with the above. - Tony Nip, MD

## 2020-08-03 ENCOUNTER — Other Ambulatory Visit (INDEPENDENT_AMBULATORY_CARE_PROVIDER_SITE_OTHER): Payer: Self-pay | Admitting: Adult Health

## 2020-08-03 ENCOUNTER — Other Ambulatory Visit: Payer: Self-pay | Admitting: Family Medicine

## 2020-08-03 DIAGNOSIS — E1169 Type 2 diabetes mellitus with other specified complication: Secondary | ICD-10-CM

## 2020-08-07 ENCOUNTER — Telehealth (INDEPENDENT_AMBULATORY_CARE_PROVIDER_SITE_OTHER): Payer: BC Managed Care – PPO | Admitting: Psychology

## 2020-08-07 DIAGNOSIS — F419 Anxiety disorder, unspecified: Secondary | ICD-10-CM

## 2020-08-07 DIAGNOSIS — F3289 Other specified depressive episodes: Secondary | ICD-10-CM | POA: Diagnosis not present

## 2020-08-07 NOTE — Telephone Encounter (Signed)
DR Leafy Ro

## 2020-08-09 ENCOUNTER — Other Ambulatory Visit (INDEPENDENT_AMBULATORY_CARE_PROVIDER_SITE_OTHER): Payer: Self-pay | Admitting: Family Medicine

## 2020-08-09 DIAGNOSIS — E1169 Type 2 diabetes mellitus with other specified complication: Secondary | ICD-10-CM

## 2020-08-14 ENCOUNTER — Telehealth (INDEPENDENT_AMBULATORY_CARE_PROVIDER_SITE_OTHER): Payer: Self-pay

## 2020-08-14 NOTE — Telephone Encounter (Signed)
DR Leafy Ro

## 2020-08-14 NOTE — Telephone Encounter (Signed)
Pt called in and stated that he needs a refill on his Metformin sent to [facility] on [address]. The pt stated that he is out of his prescription today. Please advise

## 2020-08-15 ENCOUNTER — Other Ambulatory Visit (INDEPENDENT_AMBULATORY_CARE_PROVIDER_SITE_OTHER): Payer: Self-pay | Admitting: Adult Health

## 2020-08-15 ENCOUNTER — Other Ambulatory Visit (INDEPENDENT_AMBULATORY_CARE_PROVIDER_SITE_OTHER): Payer: Self-pay | Admitting: Emergency Medicine

## 2020-08-15 DIAGNOSIS — E1169 Type 2 diabetes mellitus with other specified complication: Secondary | ICD-10-CM

## 2020-08-15 MED ORDER — METFORMIN HCL 500 MG PO TABS: ORAL_TABLET | ORAL | 0 refills | Status: DC

## 2020-08-15 NOTE — Telephone Encounter (Signed)
Next aap 08/21/20

## 2020-08-15 NOTE — Telephone Encounter (Signed)
Rx has been sent  

## 2020-08-15 NOTE — Telephone Encounter (Signed)
Ok to rf metformin x 1 month

## 2020-08-15 NOTE — Telephone Encounter (Signed)
Patient is requesting a refill of the following medications: Requested Prescriptions    No prescriptions requested or ordered in this encounter    Last office visit: 07/25/20 Date of last refill: 07/26/20 Last refill amount: 30 Follow up time period per chart: 4 week Next appt:

## 2020-08-16 ENCOUNTER — Encounter: Payer: Self-pay | Admitting: Family Medicine

## 2020-08-16 ENCOUNTER — Ambulatory Visit: Payer: Self-pay

## 2020-08-16 ENCOUNTER — Other Ambulatory Visit: Payer: Self-pay

## 2020-08-16 ENCOUNTER — Ambulatory Visit: Payer: BC Managed Care – PPO | Admitting: Family Medicine

## 2020-08-16 ENCOUNTER — Ambulatory Visit (INDEPENDENT_AMBULATORY_CARE_PROVIDER_SITE_OTHER): Payer: BC Managed Care – PPO

## 2020-08-16 VITALS — BP 130/82 | HR 73 | Ht 69.5 in | Wt 253.0 lb

## 2020-08-16 DIAGNOSIS — M9903 Segmental and somatic dysfunction of lumbar region: Secondary | ICD-10-CM

## 2020-08-16 DIAGNOSIS — M25562 Pain in left knee: Secondary | ICD-10-CM | POA: Insufficient documentation

## 2020-08-16 DIAGNOSIS — M9904 Segmental and somatic dysfunction of sacral region: Secondary | ICD-10-CM | POA: Diagnosis not present

## 2020-08-16 DIAGNOSIS — G8929 Other chronic pain: Secondary | ICD-10-CM

## 2020-08-16 DIAGNOSIS — M5136 Other intervertebral disc degeneration, lumbar region: Secondary | ICD-10-CM | POA: Diagnosis not present

## 2020-08-16 DIAGNOSIS — M9902 Segmental and somatic dysfunction of thoracic region: Secondary | ICD-10-CM

## 2020-08-16 NOTE — Progress Notes (Signed)
[facility] [address] [place] Phone: [phone] Subjective:   I Tony [name] am serving as a Education administrator for Dr. Hulan Saas.  This visit occurred during the SARS-CoV-2 public health emergency.  Safety protocols were in place, including screening questions prior to the visit, additional usage of staff PPE, and extensive cleaning of exam room while observing appropriate contact time as indicated for disinfecting solutions.   I'm seeing this patient by the request  of:  McGowen, Tony Blackwater, MD  CC: Low back pain follow-up, left knee pain  YCX:[id number]  Tony [name] is a 45 y.o. male coming in with complaint of back and neck pain. OMT 07/04/2020. Epidural 07/14/2020. Patient states he has left knee pain as well today. States left side of his back hurts with picking up the left leg. Epidural helped. Certain activities cause pain. Lateral.  Patient states that even very light palpation over the knee gives pain.  Denies any buckling or any giving out on him.  Medications patient has been prescribed: Vit D        Past Medical History:  Diagnosis Date   Abnormal weight gain 05/25/2015   Alcohol abuse 2016/2017   Pt states he was self medicating his anxiety.  Quit 04/2015.  Minimal intake as of 05/2017 f/u.   Anal fissure    Anxiety    Anxiety    Back pain    Depression    Fatty liver    Hepatic steatosis 2015; 2016; 02/2016   +Alcohol has played a role.  CT abd showed fatty liver+ liver enzymes mildly elevated (chron viral hepatitis screening NEG).  Abd u/s 03/26/16 showed fatty liver, small GB polyps, o/w normal.   History of kidney stones    [facility]   HTN (hypertension)    Hx of colonic polyp - ssp 02/11/2014   No polyps 02/2020->recall 10 yrs   Hyperlipidemia    Statin started 05/2018   Insomnia    Obesity, Class II, BMI 35-39.9    Psoriasis    Secondary male hypogonadism    clomiphene 06/2019: normalized on clomiphene 08/2019    Shortness of breath on exertion     No Known Allergies   Review of Systems:  No headache, visual changes, nausea, vomiting, diarrhea, constipation, dizziness, abdominal pain, skin rash, fevers, chills, night sweats, weight loss, swollen lymph nodes, body aches, joint swelling, chest pain, shortness of breath, mood changes. POSITIVE muscle aches  Objective  Blood pressure 130/82, pulse 73, height 5' 9.5" (1.765 m), weight 253 lb (114.8 kg), SpO2 94 %.   General: No apparent distress alert and oriented x3 mood and affect normal, dressed appropriately.  HEENT: Pupils equal, extraocular movements intact  Respiratory: Patient's speak in full sentences and does not appear short of breath  Cardiovascular: No lower extremity edema, non tender, no erythema  Low back exam does have loss of lordosis.  Tenderness to palpation of the paraspinal musculature.  Patient does have pain over the left sacroiliac joint negative straight leg test but mild worsening pain with extension  Left knee exam shows the patient is even tender to palpation to very light palpation over the lateral joint space.  No masses noted.  Patient does have mild lateral tracking of the patella noted.  All ligaments appear to be intact.  Osteopathic findings  T3 extended rotated and side bent right inhaled rib T9 extended rotated and side bent left L2 flexed rotated and side bent right Sacrum  left on left  Limited musculoskeletal ultrasound performed and interpreted by Lyndal Pulley  Limited ultrasound of patient's left knee shows that patient may have a mild abnormal cystic structure noted in the fat pad area.  Mildly tender in this area but more tenderness over actually the lateral tibial aspect.  Even to light sensation.  No true cortical irregularity noted in this area noted. Pression: Mild abnormality of the lateral fat pad but otherwise fairly unremarkable     Assessment and Plan:  Degenerative disc disease,  lumbar Patient responded to the second epidural at this time.  Encourage patient to potentially lose weight.  Patient continues to have difficulty we can do 1 more epidural but then after that I would consider the possibility for surgical intervention.  Hopefully patient will continue to make some improvement.  Discussed icing regimen and home exercises.  Follow-up with me again in 6 to 8 weeks  Left knee pain Patient does have left knee pain and seems to be out of proportion to the amount of finding.  Questionable more of a bone contusion but no significant bruising.  Questionable small potential cyst noted in the fat pad area.  May need further work-up. Is an ultrasound.  No significant meniscal injury noted.  Patient will follow up with me again in 4 to 6 weeks.   Nonallopathic problems  Decision today to treat with OMT was based on Physical Exam  After verbal consent patient was treated with HVLA, ME, FPR techniques in  thoracic, lumbar, and sacral  areas  Patient tolerated the procedure well with improvement in symptoms  Patient given exercises, stretches and lifestyle modifications  See medications in patient instructions if given  Patient will follow up in 4-8 weeks      The above documentation has been reviewed and is accurate and complete Lyndal Pulley, DO        Note: This dictation was prepared with Dragon dictation along with smaller phrase technology. Any transcriptional errors that result from this process are unintentional.

## 2020-08-16 NOTE — Assessment & Plan Note (Signed)
Patient responded to the second epidural at this time.  Encourage patient to potentially lose weight.  Patient continues to have difficulty we can do 1 more epidural but then after that I would consider the possibility for surgical intervention.  Hopefully patient will continue to make some improvement.  Discussed icing regimen and home exercises.  Follow-up with me again in 6 to 8 weeks

## 2020-08-16 NOTE — Assessment & Plan Note (Signed)
Patient does have left knee pain and seems to be out of proportion to the amount of finding.  Questionable more of a bone contusion but no significant bruising.  Questionable small potential cyst noted in the fat pad area.  May need further work-up. Is an ultrasound.  No significant meniscal injury noted.  Patient will follow up with me again in 4 to 6 weeks.

## 2020-08-16 NOTE — Patient Instructions (Addendum)
Good to see you Knee xray Exercise 3 times a week If worsening pain MRI See me again in 6 weeks

## 2020-08-21 ENCOUNTER — Encounter: Payer: Self-pay | Admitting: Family Medicine

## 2020-08-21 ENCOUNTER — Other Ambulatory Visit: Payer: Self-pay

## 2020-08-21 ENCOUNTER — Ambulatory Visit: Payer: BC Managed Care – PPO | Admitting: Family Medicine

## 2020-08-21 ENCOUNTER — Ambulatory Visit (INDEPENDENT_AMBULATORY_CARE_PROVIDER_SITE_OTHER): Payer: BC Managed Care – PPO | Admitting: Family Medicine

## 2020-08-21 VITALS — BP 103/65 | HR 68 | Temp 97.7°F | Resp 16 | Ht 69.5 in | Wt 254.4 lb

## 2020-08-21 DIAGNOSIS — F5104 Psychophysiologic insomnia: Secondary | ICD-10-CM

## 2020-08-21 DIAGNOSIS — Z79899 Other long term (current) drug therapy: Secondary | ICD-10-CM

## 2020-08-21 DIAGNOSIS — E782 Mixed hyperlipidemia: Secondary | ICD-10-CM | POA: Diagnosis not present

## 2020-08-21 DIAGNOSIS — F411 Generalized anxiety disorder: Secondary | ICD-10-CM | POA: Diagnosis not present

## 2020-08-21 DIAGNOSIS — I10 Essential (primary) hypertension: Secondary | ICD-10-CM

## 2020-08-21 LAB — COMPREHENSIVE METABOLIC PANEL
ALT: 25 U/L (ref 0–53)
AST: 28 U/L (ref 0–37)
Albumin: 4.4 g/dL (ref 3.5–5.2)
Alkaline Phosphatase: 64 U/L (ref 39–117)
BUN: 19 mg/dL (ref 6–23)
CO2: 23 mEq/L (ref 19–32)
Calcium: 9 mg/dL (ref 8.4–10.5)
Chloride: 103 mEq/L (ref 96–112)
Creatinine, Ser: 0.99 mg/dL (ref 0.40–1.50)
GFR: 92.94 mL/min (ref >60.00)
Glucose, Bld: 90 mg/dL (ref 70–99)
Potassium: 4.6 mEq/L (ref 3.5–5.1)
Sodium: 136 mEq/L (ref 135–145)
Total Bilirubin: 0.4 mg/dL (ref 0.2–1.2)
Total Protein: 7 g/dL (ref 6.0–8.3)

## 2020-08-21 LAB — LIPID PANEL
Cholesterol: 163 mg/dL (ref 0–200)
HDL: 41.2 mg/dL (ref >39.00)
LDL Cholesterol: 99 mg/dL (ref 0–99)
NonHDL: 121.82
Total CHOL/HDL Ratio: 4
Triglycerides: 112 mg/dL (ref 0.0–149.0)
VLDL: 22.4 mg/dL (ref 0.0–40.0)

## 2020-08-21 MED ORDER — LISINOPRIL 5 MG PO TABS: 5.0000 mg | ORAL_TABLET | Freq: Every day | ORAL | 3 refills | Status: DC

## 2020-08-21 MED ORDER — ESZOPICLONE 3 MG PO TABS: 3.0000 mg | ORAL_TABLET | Freq: Every day | ORAL | 1 refills | Status: DC

## 2020-08-21 NOTE — Progress Notes (Signed)
OFFICE VISIT  08/21/2020  CC:  Chief Complaint  Patient presents with   Follow-up    RCI; 31mo Pt is fasting   HPI:    Patient is a 45y.o. Caucasian male who presents for 5 mo f/u borderline DM, HTN, HLD, insomnia. A/P as of last visit: "1) DM, diet controlled. Hard to tell if his diet is low carb. Hba1c, lytes/cr today.   2) Morbid obesity (BMI 40): Exercise level not optimal. He struggles with knowing what kind of diet/exercise plan to try but admits he has trouble sticking with whatever plan he does try.   Discussed options today and decided to refer to nonsurgical bariatric clinic->[facility], Dr. CDennard Nip   3) HTN: stable. Cont lisin 148mqd. Lytes/cr today.   4) HLD: tolerating statin. FLP and hepatic panel today.   5) Secondary hypogonadism: testost normalized, LH went up after pt got on clomid BUT poor libido has persisted. Stop clomid. Wt loss emphasized as what is ultimately needed to help this.   6) ED, suspect multifactorial (psychogenic, vascular, hypogonadism, less likely med side effect). Wt loss recommended.   7) Insomnia: paradoxical rxn recently to eszopiclone. Stop this med. He has ambien at home to try and if helpful he'll let me know and I'll do rx.   8) Anx and dep: stable on 1/2 of 251mertraline qd and clonaz bid prn.  No changes. CSC UTD. Will do UDS at next f/u visit.   9) Health maintenance exam: Reviewed age and gender appropriate health maintenance issues (prudent diet, regular exercise, health risks of tobacco and excessive alcohol, use of seatbelts, fire alarms in home, use of sunscreen).  Also reviewed age and gender appropriate health screening as well as vaccine recommendations. Vaccines: ALL UTD. Labs: fasting HP + a1c today. Prostate ca screening: average risk patient= as per latest guidelines, start screening at 50 50s of age. Colon ca screening: recall 2032."  INTERIM HX: Feeling  well. Exercising 4 miles walking bid and wt lifting daily.  Insomnia: he did a temporary retry of zolpidem hoping it would help better than lunesta. He now says he had to switch back to [place] he realizes it helps better.  HTN: home bp's running 90s-low 100s syst. He doesn't note any dizziness or acute/generalized weakness.  HLD: tolerating rosuva 5 qd.  Borderline DM:  Pt followed by bariatric clinic, most recent A1c through them was 5.5% on 07/10/20. Dr. BeaShary Decamps started him on 250m20mtformin bid.  1/2 of 25mg48mtraline qd and clonaz 1mg t7mprn. Mood and anxiety level stable. Counseling with Dr. GutterCheryln ManlyP AWARE reviewed today: most recent rx for clonazepam 1mg wa8milled 06/26/20, # 180, rx by me. No red flags.  Past Medical History:  Diagnosis Date   Alcohol abuse 2016/2017   Pt states he was self medicating his anxiety.  Quit 04/2015.  Minimal intake as of 05/2017 f/u.   Anal fissure    Anxiety and depression    Back pain    Borderline diabetes    Hepatic steatosis 2015; 2016; 02/2016   +Alcohol has played a role.  CT abd showed fatty liver+ liver enzymes mildly elevated (chron viral hepatitis screening NEG).  Abd u/s 03/26/16 showed fatty liver, small GB polyps, o/w normal.   History of kidney stones    [facility]   HTN (hypertension)    Hx of colonic polyp - ssp 02/11/2014   No polyps 02/2020->recall 10 yrs  Hyperlipidemia    Statin started 05/2018   Insomnia    Obesity, Class II, BMI 35-39.9    Psoriasis    Secondary male hypogonadism    clomiphene 06/2019: normalized on clomiphene 08/2019   Shortness of breath on exertion     Past Surgical History:  Procedure Laterality Date   APPENDECTOMY     COLONOSCOPY  03/10/2020   No polyps. Recall 2032.   COLONOSCOPY W/ BIOPSIES  02/03/14   Cecal polyp (sessile serrated polyp w/out dysplasia) and posterior anal fissure; repeat TCS 2020 per Dr. Carlean Purl   HAND SURGERY Left 2001   broke left pinkie finger    LAPAROSCOPIC APPENDECTOMY N/A 02/24/2015   Procedure: APPENDECTOMY LAPAROSCOPIC;  Surgeon: Alphonsa Overall, MD;  Location: [facility];  Service: General;  Laterality: N/A;   right ankle     2014    Outpatient Medications Prior to Visit  Medication Sig Dispense Refill   carbamazepine (TEGRETOL) 100 MG chewable tablet TAKE 1 TABLET BY MOUTH IN THE MORNING THEN TAKE 2 TABLETS BY MOUTH AT BEDTIME     clonazePAM (KLONOPIN) 1 MG tablet Take 1 mg by mouth 3 (three) times daily as needed for anxiety.     fluticasone (CUTIVATE) 0.05 % cream Apply to affected areas bid prn 30 g 1   gabapentin (NEURONTIN) 100 MG capsule Take 2 capsules (200 mg total) by mouth at bedtime. 180 capsule 3   meloxicam (MOBIC) 15 MG tablet Take 15 mg by mouth daily as needed for pain.     metFORMIN (GLUCOPHAGE) 500 MG tablet 1/2 tab with breakfast 1/2 with lunch 30 tablet 0   methocarbamol (ROBAXIN-750) 750 MG tablet Take 1 tablet (750 mg total) by mouth 4 (four) times daily. 30 tablet 0   polyethylene glycol (MIRALAX / GLYCOLAX) 17 g packet Take 17 g by mouth daily as needed. 30 each 0   propranolol (INDERAL) 10 MG tablet Take 10 mg by mouth 2 (two) times daily.     rosuvastatin (CRESTOR) 5 MG tablet TAKE ONE TABLET BY MOUTH DAILY 90 tablet 1   sertraline (ZOLOFT) 25 MG tablet Take 12.5 mg by mouth daily.     Vitamin D, Ergocalciferol, (DRISDOL) 1.25 MG (50000 UNIT) CAPS capsule TAKE ONE CAPSULE BY MOUTH EVERY 7 DAYS 12 capsule 0   lisinopril (ZESTRIL) 10 MG tablet TAKE ONE TABLET BY MOUTH DAILY 90 tablet 0   traMADol (ULTRAM) 50 MG tablet 1-2 tablets every 6 hours as needed for pain 20 tablet 0   albuterol (VENTOLIN HFA) 108 (90 Base) MCG/ACT inhaler Inhale 2 puffs into the lungs every 6 (six) hours as needed for wheezing or shortness of breath. (Patient not taking: Reported on 08/21/2020) 8 g 0   fluticasone (FLONASE) 50 MCG/ACT nasal spray Place 2 sprays into both nostrils daily. (Patient not taking: Reported on 08/21/2020) 16 g 0    benzonatate (TESSALON) 100 MG capsule Take 1 capsule (100 mg total) by mouth 3 (three) times daily as needed. (Patient not taking: Reported on 08/21/2020) 30 capsule 0   No facility-administered medications prior to visit.    No Known Allergies  ROS As per HPI  PE: Vitals with BMI 08/21/2020 08/16/2020 07/24/2020  Height 5' 9.5" 5' 9.5" -  Weight 254 lbs 6 oz 253 lbs -  BMI 77.82 42.35 -  Systolic 361 443 154  Diastolic 65 82 74  Pulse 68 73 102  Some encounter information is confidential and restricted. Go to Review Flowsheets activity to see all data.  Gen: Alert, well appearing.  Patient is oriented to person, place, time, and situation. AFFECT: pleasant, lucid thought and speech. CV: RRR, no m/r/g.   LUNGS: CTA bilat, nonlabored resps, good aeration in all lung fields. EXT: no clubbing or cyanosis.  no edema.    LABS:  Lab Results  Component Value Date   TSH 1.88 03/22/2020   Lab Results  Component Value Date   WBC 5.8 03/22/2020   HGB 15.5 03/22/2020   HCT 46.0 03/22/2020   MCV 93.4 03/22/2020   PLT 141.0 (L) 03/22/2020   Lab Results  Component Value Date   CREATININE 0.94 04/13/2020   BUN 13 04/13/2020   NA 139 04/13/2020   K 4.3 04/13/2020   CL 103 04/13/2020   CO2 19 (L) 04/13/2020   Lab Results  Component Value Date   ALT 50 (H) 04/13/2020   AST 38 04/13/2020   ALKPHOS 72 04/13/2020   BILITOT 0.3 04/13/2020   Lab Results  Component Value Date   CHOL 174 04/13/2020   Lab Results  Component Value Date   HDL 43 04/13/2020   Lab Results  Component Value Date   LDLCALC 98 04/13/2020   Lab Results  Component Value Date   TRIG 192 (H) 04/13/2020   Lab Results  Component Value Date   CHOLHDL 4 03/22/2020   Lab Results  Component Value Date   HGBA1C 5.5 07/10/2020   Lab Results  Component Value Date   TESTOSTERONE 526.92 09/09/2019   IMPRESSION AND PLAN:  1) HTN, bp too low on 62m lisin-->cut back to 567mqd. Lytes/cr  today.  2) HLD, mixed: tolerating rosuva 68m33md. FLP and hepatic panel today.  3) Insomnia, managed pretty well on lunesta 3mg19ms.  4) GAD, rec MDD--in remission. Cont 1/2 sertraline 268mg22mand clonaz 1mg t368m [place] today. CSC today.  5) Borderline DM: last a1c great 6 wks ago --done by Bariatric clinic provider. She also started him on 250mg m43mrmin bid recently.  An After Visit Summary was printed and given to the patient.  FOLLOW UP: Return in about 6 months (around 02/20/2021) for annual CPE (fasting) +RCI.  Signed:  Phil McCrissie Sickles        08/21/2020

## 2020-08-22 ENCOUNTER — Encounter (INDEPENDENT_AMBULATORY_CARE_PROVIDER_SITE_OTHER): Payer: Self-pay | Admitting: Family Medicine

## 2020-08-22 ENCOUNTER — Ambulatory Visit (INDEPENDENT_AMBULATORY_CARE_PROVIDER_SITE_OTHER): Payer: BC Managed Care – PPO | Admitting: Family Medicine

## 2020-08-22 VITALS — BP 101/68 | HR 69 | Temp 98.2°F | Ht 69.0 in | Wt 248.0 lb

## 2020-08-22 DIAGNOSIS — I10 Essential (primary) hypertension: Secondary | ICD-10-CM

## 2020-08-22 DIAGNOSIS — E7849 Other hyperlipidemia: Secondary | ICD-10-CM

## 2020-08-22 DIAGNOSIS — Z9189 Other specified personal risk factors, not elsewhere classified: Secondary | ICD-10-CM

## 2020-08-22 DIAGNOSIS — Z6839 Body mass index (BMI) 39.0-39.9, adult: Secondary | ICD-10-CM

## 2020-08-22 DIAGNOSIS — E8881 Metabolic syndrome: Secondary | ICD-10-CM

## 2020-08-23 ENCOUNTER — Other Ambulatory Visit: Payer: Self-pay | Admitting: Family Medicine

## 2020-08-24 ENCOUNTER — Other Ambulatory Visit: Payer: Self-pay | Admitting: Family Medicine

## 2020-08-25 ENCOUNTER — Other Ambulatory Visit: Payer: Self-pay | Admitting: Family Medicine

## 2020-08-27 LAB — DRUG MONITORING, PANEL 8 WITH CONFIRMATION, URINE
6 Acetylmorphine: NEGATIVE ng/mL (ref <10)
Alcohol Metabolites: NEGATIVE ng/mL (ref <500)
Alphahydroxyalprazolam: NEGATIVE ng/mL (ref <25)
Alphahydroxymidazolam: NEGATIVE ng/mL (ref <50)
Alphahydroxytriazolam: NEGATIVE ng/mL (ref <50)
Aminoclonazepam: 167 ng/mL — ABNORMAL HIGH (ref <25)
Amphetamines: NEGATIVE ng/mL (ref <500)
Benzodiazepines: POSITIVE ng/mL — AB (ref <100)
Buprenorphine, Urine: NEGATIVE ng/mL (ref <5)
Cocaine Metabolite: NEGATIVE ng/mL (ref <150)
Creatinine: 80 mg/dL (ref >=20.0)
Hydroxyethylflurazepam: NEGATIVE ng/mL (ref <50)
Lorazepam: NEGATIVE ng/mL (ref <50)
MDMA: NEGATIVE ng/mL (ref <500)
Marijuana Metabolite: NEGATIVE ng/mL (ref <20)
Nordiazepam: NEGATIVE ng/mL (ref <50)
Opiates: NEGATIVE ng/mL (ref <100)
Oxazepam: NEGATIVE ng/mL (ref <50)
Oxidant: NEGATIVE ug/mL (ref <200)
Oxycodone: NEGATIVE ng/mL (ref <100)
Temazepam: NEGATIVE ng/mL (ref <50)
pH: 5.5 (ref 4.5–9.0)

## 2020-08-27 LAB — DM TEMPLATE

## 2020-08-29 NOTE — Progress Notes (Signed)
Chief Complaint:   OBESITY Tony [name] is here to discuss his progress with his obesity treatment plan along with follow-up of his obesity related diagnoses. Tony [name] is on keeping a food journal and adhering to recommended goals of 1400-1700 calories and 100+ grams of protein daily and states he is following his eating plan approximately 25% of the time. Tony [name] states he is walking 2 miles for 64 minutes 7 times per week.  Today's visit was #: 9 Starting weight: 270 lbs Starting date: 04/13/2020 Today's weight: 248 lbs Today's date: 08/22/2020 Total lbs lost to date: 22 Total lbs lost since last in-office visit: 3  Interim History: Tony [name] had been sick for almost 3 weeks and he hasn't been able to follow his plan. He has gotten back on track with journaling in the last week, and he is doing well meeting his calorie and protein goals.  Subjective:   1. Essential hypertension Tony [name] had his lisinopril decreased to 5 mg from 10 mg yesterday by his primary care physician. He continues to work on weight loss and he is trying to hydrate. I discussed labs with the patient today.  2. Insulin resistance Tony [name] is stable on metformin, and he is doing well with diet and weight loss. I discussed labs with the patient today.  3. Other hyperlipidemia Tony [name]'s last triglycerides have greatly improved with diet and weight loss. I discussed labs with the patient today.  4. At risk for complication associated with hypotension Tony [name] is at a higher than average risk of hypotension due to weight loss.  Assessment/Plan:   1. Essential hypertension Tony [name] will continue diet and exercise, and will watch for further hypotension as he continues his lifestyle modifications.  2. Insulin resistance Tony [name] will continue to work on weight loss, exercise, and decreasing simple carbohydrates to help decrease the risk of diabetes. We will refill metformin 500 mg 1/2 tablet at breakfast and 1/2 tablet at lunch #30  for 1 month. Tony [name] agreed to follow-up with [place] as directed to closely monitor his progress.  3. Other hyperlipidemia Cardiovascular risk and specific lipid/LDL goals reviewed. We discussed several lifestyle modifications today. Tony [name] will continue to work on diet, exercise and weight loss efforts. We will continue to monitor. Orders and follow up as documented in patient record.   4. At risk for complication associated with hypotension Tony [name] was given approximately 15 minutes of education and counseling today to help avoid hypotension. We discussed risks of hypotension with weight loss and signs of hypotension such as feeling lightheaded or unsteady.  Repetitive spaced learning was employed today to elicit superior memory formation and behavioral change.  5. Obesity with current BMI 36.7 Tony [name] is currently in the action stage of change. As such, his goal is to continue with weight loss efforts. He has agreed to keeping a food journal and adhering to recommended goals of 1400-1700 calories and 100+ grams of protein daily.   Exercise goals: As is.  Behavioral modification strategies: increasing lean protein intake and keeping a strict food journal.  Tony [name] has agreed to follow-up with our clinic in 4 weeks. He was informed of the importance of frequent follow-up visits to maximize his success with intensive lifestyle modifications for his multiple health conditions.   Objective:   Blood pressure 101/68, pulse 69, temperature 98.2 F (36.8 C), height 5' 9"  (1.753 m), weight 248 lb (112.5 kg), SpO2 96 %. Body mass index is 36.62 kg/m.  General: Cooperative, alert, well developed, in no acute distress. HEENT:  Conjunctivae and lids unremarkable. Cardiovascular: Regular rhythm.  Lungs: Normal work of breathing. Neurologic: No focal deficits.   Lab Results  Component Value Date   CREATININE 0.99 08/21/2020   BUN 19 08/21/2020   NA 136 08/21/2020   K 4.6 08/21/2020   CL 103  08/21/2020   CO2 23 08/21/2020   Lab Results  Component Value Date   ALT 25 08/21/2020   AST 28 08/21/2020   ALKPHOS 64 08/21/2020   BILITOT 0.4 08/21/2020   Lab Results  Component Value Date   HGBA1C 5.5 07/10/2020   HGBA1C 5.9 03/22/2020   HGBA1C 6.2 07/02/2019   HGBA1C 5.8 11/13/2018   HGBA1C 5.6 06/24/2018   Lab Results  Component Value Date   INSULIN 14.0 07/10/2020   INSULIN 35.9 (H) 04/13/2020   Lab Results  Component Value Date   TSH 1.88 03/22/2020   Lab Results  Component Value Date   CHOL 163 08/21/2020   HDL 41.20 08/21/2020   LDLCALC 99 08/21/2020   LDLDIRECT 157.0 07/02/2019   TRIG 112.0 08/21/2020   CHOLHDL 4 08/21/2020   Lab Results  Component Value Date   VD25OH 47.1 07/10/2020   VD25OH 33.4 04/13/2020   Lab Results  Component Value Date   WBC 5.8 03/22/2020   HGB 15.5 03/22/2020   HCT 46.0 03/22/2020   MCV 93.4 03/22/2020   PLT 141.0 (L) 03/22/2020   No results found for: IRON, TIBC, FERRITIN  Attestation Statements:   Reviewed by clinician on day of visit: allergies, medications, problem list, medical history, surgical history, family history, social history, and previous encounter notes.   I, Trixie Dredge, am acting as transcriptionist for Dennard Nip, MD.  I have reviewed the above documentation for accuracy and completeness, and I agree with the above. -  Dennard Nip, MD

## 2020-08-31 MED ORDER — METFORMIN HCL 500 MG PO TABS: ORAL_TABLET | ORAL | 0 refills | Status: DC

## 2020-09-07 ENCOUNTER — Other Ambulatory Visit (INDEPENDENT_AMBULATORY_CARE_PROVIDER_SITE_OTHER): Payer: Self-pay | Admitting: Family Medicine

## 2020-09-07 DIAGNOSIS — E8881 Metabolic syndrome: Secondary | ICD-10-CM

## 2020-09-07 NOTE — Telephone Encounter (Signed)
Last OV with Dr. Beasley 

## 2020-09-08 ENCOUNTER — Other Ambulatory Visit: Payer: Self-pay

## 2020-09-08 NOTE — Telephone Encounter (Signed)
Patient refill request.  He states he has been trying to get 1st prescription, and pharmacy states it keeps getting denied by our office.   Second prescription - Clonzepam - He doesn't need until 09/25/2020  Patient is leaving to go out of the country for a while, and just trying to get a jump start on some things, so he doesn't have to worry about it last few days he has left [place].  Patient okay to wait until Monday 7/18, he called late afternoon (Friday 7/15 4:45PM)   He can be reached at [phone]   1) eszopiclone 3 MG TABS [[id number]]   2) clonazePAM (KLONOPIN) 1 MG tablet [[id number]]    [name] [id number] - [place], [place] [address]

## 2020-09-11 ENCOUNTER — Other Ambulatory Visit (INDEPENDENT_AMBULATORY_CARE_PROVIDER_SITE_OTHER): Payer: Self-pay | Admitting: Family Medicine

## 2020-09-11 DIAGNOSIS — E8881 Metabolic syndrome: Secondary | ICD-10-CM

## 2020-09-11 MED ORDER — CLONAZEPAM 1 MG PO TABS: 1.0000 mg | ORAL_TABLET | Freq: Three times a day (TID) | ORAL | 1 refills | Status: DC | PRN

## 2020-09-11 NOTE — Telephone Encounter (Signed)
LM for pt to return call regarding refills.

## 2020-09-11 NOTE — Telephone Encounter (Signed)
Requesting: clonazepam Contract: 08/21/20 UDS: 08/21/20 Last Visit:08/22/20 Next Visit:03/26/21 Last Refill: 03/21/20-04/13/20  Please Advise. Medication pending. Pt already has RF on eszopiclone

## 2020-09-12 ENCOUNTER — Telehealth: Payer: Self-pay

## 2020-09-12 ENCOUNTER — Other Ambulatory Visit: Payer: Self-pay

## 2020-09-12 MED ORDER — ESZOPICLONE 3 MG PO TABS
3.0000 mg | ORAL_TABLET | Freq: Every day | ORAL | 1 refills | Status: DC
Start: 2020-09-12 — End: 2021-03-09

## 2020-09-12 NOTE — Telephone Encounter (Signed)
Pt having trouble getting this medication filled   Rx was not sent to pharmacy nor printed. Resent Rx on 09/12/20  1) eszopiclone 3 MG TABS [[id number]

## 2020-09-12 NOTE — Telephone Encounter (Signed)
Patient returning Britt's call from yesterday 7/18.  Please call patient [phone].

## 2020-09-19 ENCOUNTER — Encounter (INDEPENDENT_AMBULATORY_CARE_PROVIDER_SITE_OTHER): Payer: Self-pay | Admitting: Family Medicine

## 2020-09-19 ENCOUNTER — Other Ambulatory Visit: Payer: Self-pay

## 2020-09-19 ENCOUNTER — Ambulatory Visit (INDEPENDENT_AMBULATORY_CARE_PROVIDER_SITE_OTHER): Payer: BC Managed Care – PPO | Admitting: Family Medicine

## 2020-09-19 VITALS — BP 100/61 | HR 64 | Temp 97.9°F | Ht 69.0 in | Wt 249.0 lb

## 2020-09-19 DIAGNOSIS — I10 Essential (primary) hypertension: Secondary | ICD-10-CM

## 2020-09-19 DIAGNOSIS — Z9189 Other specified personal risk factors, not elsewhere classified: Secondary | ICD-10-CM

## 2020-09-19 DIAGNOSIS — E8881 Metabolic syndrome: Secondary | ICD-10-CM

## 2020-09-19 DIAGNOSIS — Z6839 Body mass index (BMI) 39.0-39.9, adult: Secondary | ICD-10-CM

## 2020-09-19 DIAGNOSIS — E88819 Insulin resistance, unspecified: Secondary | ICD-10-CM

## 2020-09-19 DIAGNOSIS — E559 Vitamin D deficiency, unspecified: Secondary | ICD-10-CM

## 2020-09-19 MED ORDER — VITAMIN D (ERGOCALCIFEROL) 1.25 MG (50000 UNIT) PO CAPS: ORAL_CAPSULE | ORAL | 0 refills | Status: DC

## 2020-09-19 MED ORDER — METFORMIN HCL 500 MG PO TABS: ORAL_TABLET | ORAL | 0 refills | Status: DC

## 2020-09-20 ENCOUNTER — Other Ambulatory Visit: Payer: Self-pay

## 2020-09-20 ENCOUNTER — Other Ambulatory Visit: Payer: Self-pay | Admitting: Family Medicine

## 2020-09-20 MED ORDER — ROSUVASTATIN CALCIUM 5 MG PO TABS: 5.0000 mg | ORAL_TABLET | Freq: Every day | ORAL | 1 refills | Status: DC

## 2020-09-22 NOTE — Progress Notes (Signed)
Chief Complaint:   OBESITY Tony [name] is here to discuss his progress with his obesity treatment plan along with follow-up of his obesity related diagnoses. Tony [name] is on keeping a food journal and adhering to recommended goals of 1400-1700 calories and 100+ grams of protein daily and states he is following his eating plan approximately 35-40% of the time. Tony [name] states he is walking and lifting weights for 60 minutes 6 times per week.  Today's visit was #: 10 Starting weight: 270 lbs Starting date: 04/13/2020 Today's weight: 249 lbs Today's date: 09/19/2020 Total lbs lost to date: 21 Total lbs lost since last in-office visit: 0  Interim History: Tony [name] has had extra challenges with increased temptations the last couple of weeks. He is getting ready to go on a [place] vacation soon and he is concerned about holiday weight gain.  Subjective:   1. Insulin resistance Tony [name] is stable on metformin, and he denies nausea or vomiting.  2. Vitamin D deficiency Tony [name] is stable on Vit D, but his level is not yet at goal.  3. Essential hypertension Tony [name] continues to do well with diet and weight loss. We decreased his lisinopril to 5 mg at his last visit, but his blood pressure is still low.  4. At risk for complication associated with hypotension Tony [name] is at a higher than average risk of hypotension due to low blood pressure readings.  Assessment/Plan:   1. Insulin resistance Tony [name] will continue to work on weight loss, exercise, and decreasing simple carbohydrates to help decrease the risk of diabetes. We will refill metformin for 1 month. Tony [name] agreed to follow-up with [place] as directed to closely monitor his progress.  - metFORMIN (GLUCOPHAGE) 500 MG tablet; 1/2 tab with breakfast 1/2 with lunch  Dispense: 30 tablet; Refill: 0  2. Vitamin D deficiency Low Vitamin D level contributes to fatigue and are associated with obesity, breast, and colon cancer. We will refill prescription  Vitamin D for 1 month. Tony [name] will follow-up for routine testing of Vitamin D, at least 2-3 times per year to avoid over-replacement.  - Vitamin D, Ergocalciferol, (DRISDOL) 1.25 MG (50000 UNIT) CAPS capsule; TAKE ONE CAPSULE BY MOUTH EVERY 7 DAYS  Dispense: 4 capsule; Refill: 0  3. Essential hypertension Tony [name] agreed to discontinue lisinopril for now. He is to check his blood pressure at home and contact me if his blood pressure jumps up significantly. He will continue working on healthy weight loss and exercise to improve blood pressure control.   4. At risk for complication associated with hypotension Tony [name] was given approximately 15 minutes of education and counseling today to help avoid hypotension. We discussed risks of hypotension with weight loss and signs of hypotension such as feeling lightheaded or unsteady.  Repetitive spaced learning was employed today to elicit superior memory formation and behavioral change.  5. Obesity with current BMI 36.8 Tony [name] is currently in the action stage of change. As such, his goal is to continue with weight loss efforts. He has agreed to the keeping a food journal and adhering to recommended goals of 1400-1700 calories and 100+ grams of protein daily.   Exercise goals: As is.  Behavioral modification strategies: increasing lean protein intake, dealing with family or coworker sabotage, and travel eating strategies.  Tony [name] has agreed to follow-up with our clinic in 5 weeks. He was informed of the importance of frequent follow-up visits to maximize his success with intensive lifestyle modifications for his multiple health conditions.   Objective:   Blood  pressure 100/61, pulse 64, temperature 97.9 F (36.6 C), height 5' 9"  (1.753 m), weight 249 lb (112.9 kg), SpO2 97 %. Body mass index is 36.77 kg/m.  General: Cooperative, alert, well developed, in no acute distress. HEENT: Conjunctivae and lids unremarkable. Cardiovascular: Regular rhythm.   Lungs: Normal work of breathing. Neurologic: No focal deficits.   Lab Results  Component Value Date   CREATININE 0.99 08/21/2020   BUN 19 08/21/2020   NA 136 08/21/2020   K 4.6 08/21/2020   CL 103 08/21/2020   CO2 23 08/21/2020   Lab Results  Component Value Date   ALT 25 08/21/2020   AST 28 08/21/2020   ALKPHOS 64 08/21/2020   BILITOT 0.4 08/21/2020   Lab Results  Component Value Date   HGBA1C 5.5 07/10/2020   HGBA1C 5.9 03/22/2020   HGBA1C 6.2 07/02/2019   HGBA1C 5.8 11/13/2018   HGBA1C 5.6 06/24/2018   Lab Results  Component Value Date   INSULIN 14.0 07/10/2020   INSULIN 35.9 (H) 04/13/2020   Lab Results  Component Value Date   TSH 1.88 03/22/2020   Lab Results  Component Value Date   CHOL 163 08/21/2020   HDL 41.20 08/21/2020   LDLCALC 99 08/21/2020   LDLDIRECT 157.0 07/02/2019   TRIG 112.0 08/21/2020   CHOLHDL 4 08/21/2020   Lab Results  Component Value Date   VD25OH 47.1 07/10/2020   VD25OH 33.4 04/13/2020   Lab Results  Component Value Date   WBC 5.8 03/22/2020   HGB 15.5 03/22/2020   HCT 46.0 03/22/2020   MCV 93.4 03/22/2020   PLT 141.0 (L) 03/22/2020   No results found for: IRON, TIBC, FERRITIN  Attestation Statements:   Reviewed by clinician on day of visit: allergies, medications, problem list, medical history, surgical history, family history, social history, and previous encounter notes.   I, Trixie Dredge, am acting as transcriptionist for Dennard Nip, MD.  I have reviewed the above documentation for accuracy and completeness, and I agree with the above. -  Dennard Nip, MD

## 2020-09-25 DIAGNOSIS — E349 Endocrine disorder, unspecified: Secondary | ICD-10-CM | POA: Diagnosis not present

## 2020-09-25 DIAGNOSIS — N5201 Erectile dysfunction due to arterial insufficiency: Secondary | ICD-10-CM | POA: Diagnosis not present

## 2020-09-27 ENCOUNTER — Encounter: Payer: Self-pay | Admitting: Family Medicine

## 2020-10-02 ENCOUNTER — Ambulatory Visit: Payer: BC Managed Care – PPO | Admitting: Family Medicine

## 2020-10-02 ENCOUNTER — Other Ambulatory Visit: Payer: Self-pay

## 2020-10-02 ENCOUNTER — Encounter: Payer: Self-pay | Admitting: Family Medicine

## 2020-10-02 VITALS — BP 112/70 | HR 76 | Ht 69.0 in | Wt 253.0 lb

## 2020-10-02 DIAGNOSIS — M9903 Segmental and somatic dysfunction of lumbar region: Secondary | ICD-10-CM

## 2020-10-02 DIAGNOSIS — M5136 Other intervertebral disc degeneration, lumbar region: Secondary | ICD-10-CM | POA: Diagnosis not present

## 2020-10-02 DIAGNOSIS — M25562 Pain in left knee: Secondary | ICD-10-CM | POA: Diagnosis not present

## 2020-10-02 DIAGNOSIS — M9902 Segmental and somatic dysfunction of thoracic region: Secondary | ICD-10-CM | POA: Diagnosis not present

## 2020-10-02 DIAGNOSIS — M9904 Segmental and somatic dysfunction of sacral region: Secondary | ICD-10-CM

## 2020-10-02 DIAGNOSIS — G8929 Other chronic pain: Secondary | ICD-10-CM

## 2020-10-02 NOTE — Patient Instructions (Addendum)
Good to see you  [facility] will call you  See me in 2-3 months

## 2020-10-02 NOTE — Assessment & Plan Note (Signed)
Continued pain at the moment.  Discussed icing regimen and home exercises.  Discussed with Still avoid certain twisting.  Worsening pain consider the MRI

## 2020-10-02 NOTE — Progress Notes (Signed)
[facility] [place] Phone: [phone] Subjective:   Tony [name], am serving as a scribe for Dr. Hulan Saas.  I'm seeing this patient by the request  of:  McGowen, Adrian Blackwater, MD  CC: Back and neck pain follow-up  [id number]  Tony [name] is a 45 y.o. male coming in with complaint of back and neck pain. OMT 08/16/2020. Also L knee pain f/u. Patient states that he is doing well.   Medications patient has been prescribed: Gabapentin  Taking:Gabapentin          Past Medical History:  Diagnosis Date   Alcohol abuse 2016/2017   Pt states he was self medicating his anxiety.  Quit 04/2015.  Minimal intake as of 05/2017 f/u.   Anal fissure    Anxiety and depression    Back pain    Borderline diabetes    Erectile dysfunction    cialis 61m qd-qod started by urol 09/2020   Hepatic steatosis 2015; 2016; 02/2016   +Alcohol has played a role.  CT abd showed fatty liver+ liver enzymes mildly elevated (chron viral hepatitis screening NEG).  Abd u/s 03/26/16 showed fatty liver, small GB polyps, o/w normal.   History of kidney stones    [facility]   HTN (hypertension)    Hx of colonic polyp - ssp 02/11/2014   No polyps 02/2020->recall 10 yrs   Hyperlipidemia    Statin started 05/2018   Insomnia    Obesity, Class II, BMI 35-39.9    Psoriasis    Secondary male hypogonadism    clomiphene 06/2019: normalized on clomiphene 08/2019   Shortness of breath on exertion     No Known Allergies   Review of Systems:  No headache, visual changes, nausea, vomiting, diarrhea, constipation, dizziness, abdominal pain, skin rash, fevers, chills, night sweats, weight loss, swollen lymph nodes, body aches, joint swelling, chest pain, shortness of breath, mood changes. POSITIVE muscle aches  Objective  There were no vitals taken for this visit.   General: No apparent distress alert and oriented x3 mood and affect normal, dressed  appropriately.  HEENT: Pupils equal, extraocular movements intact  Respiratory: Patient's speak in full sentences and does not appear short of breath  Cardiovascular: No lower extremity edema, non tender, no erythema  Low back exam does have some mild tenderness to palpation still noted.  Negative straight leg test.  Does have some tightness of the lower extremities.  Neurovascular intact distally.  Osteopathic findings   T7 extended rotated and side bent left L2 flexed rotated and side bent right Sacrum right on right       Assessment and Plan:  Degenerative disc disease, lumbar Known degenerative disc disease.  Has responded well though to the epidurals.  Continue with the osteopathic manipulation.  We discussed icing regimen and home exercises, we discussed which activities to do which wants to avoid.  Increase activity slowly.  Follow-up again in 8 weeks  Left knee pain Continued pain at the moment.  Discussed icing regimen and home exercises.  Discussed with Still avoid certain twisting.  Worsening pain consider the MRI   Nonallopathic problems  Decision today to treat with OMT was based on Physical Exam  After verbal consent patient was treated with HVLA, ME, FPR techniques in  thoracic, lumbar, and sacral  areas  Patient tolerated the procedure well with improvement in symptoms  Patient given exercises, stretches and lifestyle modifications  See medications in patient  instructions if given  Patient will follow up in 4-8 weeks      The above documentation has been reviewed and is accurate and complete Lyndal Pulley, DO        Note: This dictation was prepared with Dragon dictation along with smaller phrase technology. Any transcriptional errors that result from this process are unintentional.

## 2020-10-02 NOTE — Assessment & Plan Note (Signed)
Known degenerative disc disease.  Has responded well though to the epidurals.  Continue with the osteopathic manipulation.  We discussed icing regimen and home exercises, we discussed which activities to do which wants to avoid.  Increase activity slowly.  Follow-up again in 8 weeks

## 2020-10-08 ENCOUNTER — Other Ambulatory Visit (INDEPENDENT_AMBULATORY_CARE_PROVIDER_SITE_OTHER): Payer: Self-pay | Admitting: Family Medicine

## 2020-10-08 DIAGNOSIS — E8881 Metabolic syndrome: Secondary | ICD-10-CM

## 2020-10-24 ENCOUNTER — Ambulatory Visit (INDEPENDENT_AMBULATORY_CARE_PROVIDER_SITE_OTHER): Payer: BC Managed Care – PPO | Admitting: Family Medicine

## 2020-10-24 ENCOUNTER — Encounter (INDEPENDENT_AMBULATORY_CARE_PROVIDER_SITE_OTHER): Payer: Self-pay | Admitting: Family Medicine

## 2020-10-24 ENCOUNTER — Other Ambulatory Visit: Payer: Self-pay

## 2020-10-24 VITALS — BP 111/75 | HR 65 | Temp 98.2°F | Ht 69.0 in | Wt 254.0 lb

## 2020-10-24 DIAGNOSIS — E1169 Type 2 diabetes mellitus with other specified complication: Secondary | ICD-10-CM

## 2020-10-24 DIAGNOSIS — Z6839 Body mass index (BMI) 39.0-39.9, adult: Secondary | ICD-10-CM | POA: Diagnosis not present

## 2020-10-24 DIAGNOSIS — Z9189 Other specified personal risk factors, not elsewhere classified: Secondary | ICD-10-CM | POA: Diagnosis not present

## 2020-10-24 DIAGNOSIS — I10 Essential (primary) hypertension: Secondary | ICD-10-CM | POA: Diagnosis not present

## 2020-10-24 MED ORDER — METFORMIN HCL 500 MG PO TABS: ORAL_TABLET | ORAL | 0 refills | Status: DC

## 2020-10-24 NOTE — Progress Notes (Signed)
Chief Complaint:   OBESITY Stella is here to discuss his progress with his obesity treatment plan along with follow-up of his obesity related diagnoses. Yon is on keeping a food journal and adhering to recommended goals of 1400-1700 calories and 100+ grams of protein daily and states he is following his eating plan approximately 10% of the time. Juanjose states he is walking for 60 minutes 7 times per week.  Today's visit was #: 11 Starting weight: 270 lbs Starting date: 04/13/2020 Today's weight: 254 lbs Today's date: 10/24/2020 Total lbs lost to date: 16 Total lbs lost since last in-office visit: 0  Interim History: Jemario has been on vacation and he has gotten back to his eating plan and exercise. He plans to start back to the gym and add strengthening.  Subjective:   1. Type 2 diabetes mellitus with other specified complication, without long-term current use of insulin (HCC) Nilesh is stable on metformin, and he denies nausea or vomiting.  2. Essential hypertension Heyden is on propranolol, and his blood pressure is very well controlled with diet. He rarely uses salt and he has questions about ideal sodium intake.  3. At risk for heart disease Delyle is at a higher than average risk for cardiovascular disease due to obesity.   Assessment/Plan:   1. Type 2 diabetes mellitus with other specified complication, without long-term current use of insulin (HCC) Cordarius will continue metformin, and we will refill for 1 month. Good blood sugar control is important to decrease the likelihood of diabetic complications such as nephropathy, neuropathy, limb loss, blindness, coronary artery disease, and death. Intensive lifestyle modification including diet, exercise and weight loss are the first line of treatment for diabetes.   - metFORMIN (GLUCOPHAGE) 500 MG tablet; 1/2 tab with breakfast 1/2 with lunch  Dispense: 30 tablet; Refill: 0  2. Essential hypertension Rondel is to keep his  sodium intake below 300 mg daily, and low sodium jerky options were discussed. He will continue working on diet and exercise to improve blood pressure control. He will watch for signs of hypotension as he continues his lifestyle modifications.  3. At risk for heart disease Khalfani was given approximately 15 minutes of coronary artery disease prevention counseling today. He is 45 y.o. male and has risk factors for heart disease including obesity. We discussed intensive lifestyle modifications today with an emphasis on specific weight loss instructions and strategies.   Repetitive spaced learning was employed today to elicit superior memory formation and behavioral change.  4. Obesity with current BMI 37.5 Jefferie is currently in the action stage of change. As such, his goal is to continue with weight loss efforts. He has agreed to the Category 4 Plan or keeping a food journal and adhering to recommended goals of 1400-1700 calories and 100+ grams of protein daily.   Exercise goals: As is.  Behavioral modification strategies: increasing lean protein intake.  Avrum has agreed to follow-up with our clinic in 3 to 4 weeks. He was informed of the importance of frequent follow-up visits to maximize his success with intensive lifestyle modifications for his multiple health conditions.   Objective:   Blood pressure 111/75, pulse 65, temperature 98.2 F (36.8 C), height 5' 9"  (1.753 m), weight 254 lb (115.2 kg), SpO2 98 %. Body mass index is 37.51 kg/m.  General: Cooperative, alert, well developed, in no acute distress. HEENT: Conjunctivae and lids unremarkable. Cardiovascular: Regular rhythm.  Lungs: Normal work of breathing. Neurologic: No focal deficits.   Lab  Results  Component Value Date   CREATININE 0.99 08/21/2020   BUN 19 08/21/2020   NA 136 08/21/2020   K 4.6 08/21/2020   CL 103 08/21/2020   CO2 23 08/21/2020   Lab Results  Component Value Date   ALT 25 08/21/2020   AST 28  08/21/2020   ALKPHOS 64 08/21/2020   BILITOT 0.4 08/21/2020   Lab Results  Component Value Date   HGBA1C 5.5 07/10/2020   HGBA1C 5.9 03/22/2020   HGBA1C 6.2 07/02/2019   HGBA1C 5.8 11/13/2018   HGBA1C 5.6 06/24/2018   Lab Results  Component Value Date   INSULIN 14.0 07/10/2020   INSULIN 35.9 (H) 04/13/2020   Lab Results  Component Value Date   TSH 1.88 03/22/2020   Lab Results  Component Value Date   CHOL 163 08/21/2020   HDL 41.20 08/21/2020   LDLCALC 99 08/21/2020   LDLDIRECT 157.0 07/02/2019   TRIG 112.0 08/21/2020   CHOLHDL 4 08/21/2020   Lab Results  Component Value Date   VD25OH 47.1 07/10/2020   VD25OH 33.4 04/13/2020   Lab Results  Component Value Date   WBC 5.8 03/22/2020   HGB 15.5 03/22/2020   HCT 46.0 03/22/2020   MCV 93.4 03/22/2020   PLT 141.0 (L) 03/22/2020   No results found for: IRON, TIBC, FERRITIN  Attestation Statements:   Reviewed by clinician on day of visit: allergies, medications, problem list, medical history, surgical history, family history, social history, and previous encounter notes.   I, Trixie Dredge, am acting as transcriptionist for Dennard Nip, MD.  I have reviewed the above documentation for accuracy and completeness, and I agree with the above. -  Dennard Nip, MD

## 2020-10-25 ENCOUNTER — Other Ambulatory Visit (INDEPENDENT_AMBULATORY_CARE_PROVIDER_SITE_OTHER): Payer: Self-pay | Admitting: Family Medicine

## 2020-10-25 DIAGNOSIS — E1169 Type 2 diabetes mellitus with other specified complication: Secondary | ICD-10-CM

## 2020-10-25 NOTE — Telephone Encounter (Signed)
Pt last seen by Dr. Leafy Ro.

## 2020-10-26 ENCOUNTER — Ambulatory Visit: Payer: BC Managed Care – PPO | Admitting: Physical Therapy

## 2020-10-26 ENCOUNTER — Encounter: Payer: Self-pay | Admitting: Physical Therapy

## 2020-10-26 ENCOUNTER — Other Ambulatory Visit: Payer: Self-pay

## 2020-10-26 DIAGNOSIS — M25562 Pain in left knee: Secondary | ICD-10-CM | POA: Diagnosis not present

## 2020-10-26 DIAGNOSIS — M545 Low back pain, unspecified: Secondary | ICD-10-CM

## 2020-10-26 DIAGNOSIS — G8929 Other chronic pain: Secondary | ICD-10-CM | POA: Diagnosis not present

## 2020-10-26 NOTE — Patient Instructions (Signed)
Access Code: [email or web address] URL: [email or web address] Date: 10/26/2020 Prepared by: Lyndee Hensen  Exercises Supine Posterior Pelvic Tilt - 1 x daily - 2 sets - 10 reps Single Knee to Chest Stretch - 2 x daily - 3 reps - 30 hold Supine Lower Trunk Rotation - 2 x daily - 10 reps - 5 hold Supine Bridge - 1 x daily - 2 sets - 10 reps Seated Hamstring Stretch - 2 x daily - 3 reps - 30 hold

## 2020-11-01 ENCOUNTER — Encounter (INDEPENDENT_AMBULATORY_CARE_PROVIDER_SITE_OTHER): Payer: Self-pay | Admitting: Family Medicine

## 2020-11-02 NOTE — Telephone Encounter (Signed)
Pt last seen by Dr. Leafy Ro.

## 2020-11-06 ENCOUNTER — Other Ambulatory Visit (INDEPENDENT_AMBULATORY_CARE_PROVIDER_SITE_OTHER): Payer: Self-pay | Admitting: Emergency Medicine

## 2020-11-06 DIAGNOSIS — N5201 Erectile dysfunction due to arterial insufficiency: Secondary | ICD-10-CM | POA: Diagnosis not present

## 2020-11-06 DIAGNOSIS — F3289 Other specified depressive episodes: Secondary | ICD-10-CM

## 2020-11-06 MED ORDER — BUPROPION HCL ER (SR) 150 MG PO TB12: 150.0000 mg | ORAL_TABLET | Freq: Every day | ORAL | 0 refills | Status: DC

## 2020-11-06 NOTE — Telephone Encounter (Signed)
Please send him in Wellbutrin SR 150 mg qam x 1 month

## 2020-11-06 NOTE — Telephone Encounter (Signed)
Pt called in and stated that he sent in a message on the 7th and hasnt heard back. I told the pt I will send a message to the nurse. Please advise

## 2020-11-06 NOTE — Telephone Encounter (Signed)
Please review

## 2020-11-07 ENCOUNTER — Ambulatory Visit: Payer: BC Managed Care – PPO | Admitting: Physical Therapy

## 2020-11-07 ENCOUNTER — Other Ambulatory Visit (INDEPENDENT_AMBULATORY_CARE_PROVIDER_SITE_OTHER): Payer: Self-pay | Admitting: Emergency Medicine

## 2020-11-07 ENCOUNTER — Other Ambulatory Visit: Payer: Self-pay

## 2020-11-07 ENCOUNTER — Encounter: Payer: Self-pay | Admitting: Physical Therapy

## 2020-11-07 DIAGNOSIS — M25562 Pain in left knee: Secondary | ICD-10-CM

## 2020-11-07 DIAGNOSIS — G8929 Other chronic pain: Secondary | ICD-10-CM | POA: Diagnosis not present

## 2020-11-07 DIAGNOSIS — M545 Low back pain, unspecified: Secondary | ICD-10-CM

## 2020-11-07 DIAGNOSIS — F3289 Other specified depressive episodes: Secondary | ICD-10-CM

## 2020-11-07 NOTE — Therapy (Signed)
[facility] [address] [place], [place], [address] Phone: [phone]   Fax:  [phone]  Physical Therapy Treatment  Patient Details  Name: RACIEL CAFFREY MRN: [id number] Date of Birth: [birth year] Referring Provider (PT): Charlann Boxer   Encounter Date: 11/07/2020   PT End of Session - 11/07/20 2132     Visit Number 2    Number of Visits 16    Date for PT Re-Evaluation 12/21/20    Authorization Type BCBS    PT Start Time [phone]    PT Stop Time 0930    PT Time Calculation (min) 44 min    Activity Tolerance Patient tolerated treatment well    Behavior During Therapy [facility] for tasks assessed/performed             Past Medical History:  Diagnosis Date   Alcohol abuse 2016/2017   Pt states he was self medicating his anxiety.  Quit 04/2015.  Minimal intake as of 05/2017 f/u.   Anal fissure    Anxiety and depression    Back pain    Borderline diabetes    Erectile dysfunction    cialis 15m qd-qod started by urol 09/2020   Hepatic steatosis 2015; 2016; 02/2016   +Alcohol has played a role.  CT abd showed fatty liver+ liver enzymes mildly elevated (chron viral hepatitis screening NEG).  Abd u/s 03/26/16 showed fatty liver, small GB polyps, o/w normal.   History of kidney stones    [facility]   HTN (hypertension)    Hx of colonic polyp - ssp 02/11/2014   No polyps 02/2020->recall 10 yrs   Hyperlipidemia    Statin started 05/2018   Insomnia    Obesity, Class II, BMI 35-39.9    Psoriasis    Secondary male hypogonadism    clomiphene 06/2019: normalized on clomiphene 08/2019   Shortness of breath on exertion     Past Surgical History:  Procedure Laterality Date   APPENDECTOMY     COLONOSCOPY  03/10/2020   No polyps. Recall 2032.   COLONOSCOPY W/ BIOPSIES  02/03/14   Cecal polyp (sessile serrated polyp w/out dysplasia) and posterior anal fissure; repeat TCS 2020 per Dr. GCarlean Purl  HAND SURGERY Left 2001   broke left pinkie finger    LAPAROSCOPIC APPENDECTOMY N/A 02/24/2015   Procedure: APPENDECTOMY LAPAROSCOPIC;  Surgeon: DAlphonsa Overall MD;  Location: [facility];  Service: General;  Laterality: N/A;   right ankle     2014    There were no vitals filed for this visit.   Subjective Assessment - 11/07/20 2130     Subjective Pt states minimal changes in pain. Has been doing HEP. He has soreness in back mostly in AMs, better with movment.    Currently in Pain? Yes    Pain Score 7     Pain Location Knee    Pain Orientation Left    Pain Descriptors / Indicators Aching;Sharp    Pain Type Acute pain;Chronic pain    Pain Radiating Towards sharp pain, intermittent    Pain Onset More than a month ago    Pain Frequency Intermittent    Multiple Pain Sites Yes    Pain Score 4    Pain Location Back    Pain Orientation Left;Right    Pain Descriptors / Indicators Aching    Pain Type Chronic pain    Pain Onset More than a month ago    Pain Frequency Intermittent    Aggravating Factors  first think  in AM. increased activity standing, walking                               [facility] Adult PT Treatment/Exercise - 11/07/20 2140       Exercises   Exercises Lumbar      Lumbar Exercises: Stretches   Active Hamstring Stretch 3 reps;30 seconds    Active Hamstring Stretch Limitations seated    Single Knee to Chest Stretch 2 reps;30 seconds    Lower Trunk Rotation 5 reps;10 seconds    Pelvic Tilt 20 reps    Piriformis Stretch 3 reps;30 seconds    Piriformis Stretch Limitations supine fig 4 ;      Lumbar Exercises: Aerobic   Recumbent Bike L2 x 5 min;      Lumbar Exercises: Seated   Sit to Stand 10 reps      Lumbar Exercises: Supine   Bridge 20 reps    Straight Leg Raise 10 reps    Straight Leg Raises Limitations with TA bil;      Manual Therapy   Manual Therapy Joint mobilization;Soft tissue mobilization;Passive ROM    Manual therapy comments skilled palpation and monitoring of soft tissue with dry  needling.    Joint Mobilization L knee: patella mobs, Fibular glides    Soft tissue mobilization IASTm to quad, lateral quad and ITB;    Passive ROM for full knee flex/ext and tibial rotaion              Trigger Point Dry Needling - 11/07/20 0001     Consent Given? Yes    Education Handout Provided Yes    Muscles Treated Lower Quadrant Vastus lateralis    Vastus lateralis Response Twitch response elicited;Palpable increased muscle length   L                  PT Education - 11/07/20 2132     Education Details Reviewed HEP    Person(s) Educated Patient    Methods Explanation;Demonstration;Tactile cues;Verbal cues;Handout    Comprehension Returned demonstration;Verbalized understanding;Verbal cues required;Tactile cues required;Need further instruction              PT Short Term Goals - 11/07/20 2106       PT SHORT TERM GOAL #1   Title Pt to be independent wtih initial HEP    Time 2    Period Weeks    Status New    Target Date 11/09/20               PT Long Term Goals - 11/07/20 2108       PT LONG TERM GOAL #1   Title Pt to be independent with final HEP    Time 8    Period Weeks    Status New    Target Date 12/21/20      PT LONG TERM GOAL #2   Title Pt to report decreased pain in low back to 0-2/10 in mornings and with activity.    Time 8    Period Weeks    Status New    Target Date 12/21/20      PT LONG TERM GOAL #3   Title Pt to be reprot decreased pain in L knee to 0-2/10 with ROM, strengthening ,stairs, and stability exercises.    Time 8    Period Weeks    Status New    Target Date 12/21/20  Plan - 11/07/20 2133     Clinical Impression Statement Manual done for L knee, patella mobs for improving tracking and IASTM to lateral knee for lateral quat and ITB tightness. Dry needling done for lateral quad as well, pt with fair tolerance, requests no other needling today. Pt with good LE mechanics for squats,  does have poor core stability, improved some with cuing for TA. Pt to benefit from continued manual and strength for L knee, as well as progressive strength and stabilization for core.    Personal Factors and Comorbidities Time since onset of injury/illness/exacerbation    Examination-Activity Limitations Lift;Stand;Bend;Carry    Examination-Participation Restrictions Cleaning;Yard Work;Community Activity;Shop    Stability/Clinical Decision Making Stable/Uncomplicated    Rehab Potential Good    PT Frequency 2x / week    PT Duration 8 weeks    PT Treatment/Interventions ADLs/Self Care Home Management;Cryotherapy;Scientist, product/process development;Iontophoresis 73m/ml Dexamethasone;Moist Heat;Traction;Ultrasound;Gait training;Stair training;Functional mobility training;Therapeutic activities;Balance training;Therapeutic exercise;Neuromuscular re-education;Patient/family education;Manual techniques;Passive range of motion;Dry needling;Taping;Spinal Manipulations;Joint Manipulations    PT Home Exercise Plan [email or web address]   Consulted and Agree with Plan of Care Patient             Patient will benefit from skilled therapeutic intervention in order to improve the following deficits and impairments:  Pain, Decreased mobility, Increased muscle spasms, Decreased strength, Decreased range of motion, Impaired flexibility  Visit Diagnosis: Chronic bilateral low back pain without sciatica  Chronic pain of left knee     Problem List Patient Active Problem List   Diagnosis Date Noted   Left knee pain 08/16/2020   Other fatigue 04/13/2020   Shortness of breath on exertion 04/13/2020   Screening for depression 04/13/2020   Vitamin D deficiency 04/13/2020   At risk for heart disease 04/13/2020   Diabetes mellitus ([place] 04/13/2020   Pain in finger of left hand 04/05/2020   Nonallopathic lesion of sacral region 10/19/2019   Nonallopathic lesion of lumbar region 10/19/2019   Nonallopathic lesion  of thoracic region 10/19/2019   Degenerative disc disease, lumbar 09/29/2019   Morbid obesity ([place] 09/09/2019   Abnormal weight gain 05/25/2015   Hyperlipidemia, mixed 05/25/2015   Abdominal pain, chronic, right lower quadrant 01/13/2014   Hypertension associated with diabetes ([place] 12/08/2009   BACK PAIN 12/08/2009   INSOMNIA 01/18/2008   GAD (generalized anxiety disorder) 09/04/2007   SORE THROAT 09/04/2007   INTERMITTENT VERTIGO 03/09/2007   ELEVATED BLOOD PRESSURE WITHOUT DIAGNOSIS OF HYPERTENSION 03/09/2007  LLyndee Hensen PT, DPT 9:40 PM  11/07/20    [facility] [address]Phone: [phone]  Fax:  [phone] Name: RRACE LATOURMRN: [id number] of Birth: [birth year]

## 2020-11-07 NOTE — Therapy (Signed)
[facility] [address] [place], [place], [address] Phone: [phone]   Fax:  [phone]  Physical Therapy Evaluation  Patient Details  Name: Tony [name] MRN: [id number] Date of Birth: 1975-03-05 Referring Provider (PT): Charlann Boxer   Encounter Date: 10/26/2020   PT End of Session - 11/07/20 2103     Visit Number 1    Number of Visits 16    Date for PT Re-Evaluation 12/21/20    Authorization Type BCBS    PT Start Time 0804    PT Stop Time 0844    PT Time Calculation (min) 40 min    Activity Tolerance Patient tolerated treatment well    Behavior During Therapy [facility] for tasks assessed/performed             Past Medical History:  Diagnosis Date   Alcohol abuse 2016/2017   Pt states he was self medicating his anxiety.  Quit 04/2015.  Minimal intake as of 05/2017 f/u.   Anal fissure    Anxiety and depression    Back pain    Borderline diabetes    Erectile dysfunction    cialis 60m qd-qod started by urol 09/2020   Hepatic steatosis 2015; 2016; 02/2016   +Alcohol has played a role.  CT abd showed fatty liver+ liver enzymes mildly elevated (chron viral hepatitis screening NEG).  Abd u/s 03/26/16 showed fatty liver, small GB polyps, o/w normal.   History of kidney stones    [facility]   HTN (hypertension)    Hx of colonic polyp - ssp 02/11/2014   No polyps 02/2020->recall 10 yrs   Hyperlipidemia    Statin started 05/2018   Insomnia    Obesity, Class II, BMI 35-39.9    Psoriasis    Secondary male hypogonadism    clomiphene 06/2019: normalized on clomiphene 08/2019   Shortness of breath on exertion     Past Surgical History:  Procedure Laterality Date   APPENDECTOMY     COLONOSCOPY  03/10/2020   No polyps. Recall 2032.   COLONOSCOPY W/ BIOPSIES  02/03/14   Cecal polyp (sessile serrated polyp w/out dysplasia) and posterior anal fissure; repeat TCS 2020 per Dr. GCarlean Purl  HAND SURGERY Left 2001   broke left pinkie finger    LAPAROSCOPIC APPENDECTOMY N/A 02/24/2015   Procedure: APPENDECTOMY LAPAROSCOPIC;  Surgeon: DAlphonsa Overall MD;  Location: [facility];  Service: General;  Laterality: N/A;   right ankle     2014    There were no vitals filed for this visit.    Subjective Assessment - 11/07/20 2059     Subjective Pt referred for L knee pain. States ongoing pain, mostly with  kneeling, laying in bed, turning over. No pain with exercise, work. States pain with lateral/posterior pressure on knee. He also states ongoing back pain that is bothersome to him, would like to work on this as well. Has had chronic pain, did start to get epidurals more recently which he thinks is helping He has only mild pain, and "tightness" at this time, after activities, standing, walking, golf, but pain has been very dibilitating in the past. and has limited his activity.  Exercise: Walks, weights.    Limitations Lifting;Standing;House hold activities;Walking    Patient Stated Goals decreased pain in knee and back.    Currently in Pain? Yes    Pain Score 7     Pain Location Knee    Pain Orientation Left    Pain Descriptors / Indicators Aching;SHervey Ard  Pain Type Acute pain    Pain Radiating Towards sharp pain, intermittently    Pain Onset More than a month ago    Pain Frequency Intermittent    Aggravating Factors  lateral/posterior pressure on knee, turning knees to side when sitting,                [facility] PT Assessment - 11/07/20 0001       Assessment   Medical Diagnosis L knee pain, low back pain    Referring Provider (PT) Charlann Boxer    Prior Therapy no      Precautions   Precautions None      Prior Function   Level of Independence Independent      Cognition   Overall Cognitive Status Within Functional Limits for tasks assessed      AROM   Overall AROM Comments Knee: WNL    Lumbar Flexion mild/mod limitatoin    Lumbar Extension wfl    Lumbar - Right Side Bend wfl    Lumbar - Left Side Bend mod limitation     Lumbar - Right Rotation wfl    Lumbar - Left Rotation mild limitation      Strength   Overall Strength Comments Hips: 4+/5; Knee: 5/5      Palpation   Palpation comment Tenderness in central lumbar region with PAs, mild pain into R musculature, no pain in glutes; No pain to palpate L knee except with lateral/posterior pressure at lateral knee , causing sharp pain, tightness in bil HS, mild patella hypomobility.      Special Tests   Other special tests Increased pain in L knee with posterior/lateral pressure at lateral knee                        Objective measurements completed on examination: See above findings.       [facility] Adult PT Treatment/Exercise - 11/07/20 2121       Exercises   Exercises Lumbar      Lumbar Exercises: Stretches   Active Hamstring Stretch 3 reps;30 seconds    Active Hamstring Stretch Limitations seated    Lower Trunk Rotation 5 reps;10 seconds    Pelvic Tilt 20 reps      Lumbar Exercises: Supine   Bridge 20 reps                     PT Education - 11/07/20 2102     Education Details PT POC, exam findings, HEP    Person(s) Educated Patient    Methods Explanation;Demonstration;Tactile cues;Verbal cues;Handout    Comprehension Verbalized understanding;Returned demonstration;Verbal cues required;Tactile cues required;Need further instruction              PT Short Term Goals - 11/07/20 2106       PT SHORT TERM GOAL #1   Title Pt to be independent wtih initial HEP    Time 2    Period Weeks    Status New    Target Date 11/09/20               PT Long Term Goals - 11/07/20 2108       PT LONG TERM GOAL #1   Title Pt to be independent with final HEP    Time 8    Period Weeks    Status New    Target Date 12/21/20      PT LONG TERM GOAL #2   Title Pt to report decreased  pain in low back to 0-2/10 in mornings and with activity.    Time 8    Period Weeks    Status New    Target Date 12/21/20      PT  LONG TERM GOAL #3   Title Pt to be reprot decreased pain in L knee to 0-2/10 with ROM, strengthening ,stairs, and stability exercises.    Time 8    Period Weeks    Status New    Target Date 12/21/20                    Plan - 11/07/20 2111     Clinical Impression Statement Pt presents wtih primary complaint of increased pain in L knee and low back. Back pain has been chronic. He has instability and weakness in core and hips, and has lack of effective HEP for ongoing dysfunction. He has increased anterior pelvic tilt with poor core stabilization with activity, bending and squatting. Pt with minimal strength deficits in knee. He does have decreased patella mobility, and increased pain in lateral knee with lateral/posterior glide. Pt with significant/sharp pain in this region, even with gentle pressure at times. Pt with good ability for most functional activity without pain in knee, and only has pain with a few certain movements. Pt with decreased ability for full functional activities due to pain, and will benefit from skilled PT to improve.    Personal Factors and Comorbidities Time since onset of injury/illness/exacerbation    Examination-Activity Limitations Lift;Stand;Bend;Carry    Examination-Participation Restrictions Cleaning;Yard Work;Community Activity;Shop    Stability/Clinical Decision Making Stable/Uncomplicated    Clinical Decision Making Low    Rehab Potential Good    PT Frequency 2x / week    PT Duration 8 weeks    PT Treatment/Interventions ADLs/Self Care Home Management;Cryotherapy;Scientist, product/process development;Iontophoresis 26m/ml Dexamethasone;Moist Heat;Traction;Ultrasound;Gait training;Stair training;Functional mobility training;Therapeutic activities;Balance training;Therapeutic exercise;Neuromuscular re-education;Patient/family education;Manual techniques;Passive range of motion;Dry needling;Taping;Spinal Manipulations;Joint Manipulations    PT Home  Exercise Plan [email or web address]   Consulted and Agree with Plan of Care Patient             Patient will benefit from skilled therapeutic intervention in order to improve the following deficits and impairments:  Pain, Decreased mobility, Increased muscle spasms, Decreased strength, Decreased range of motion, Impaired flexibility  Visit Diagnosis: Chronic bilateral low back pain without sciatica  Chronic pain of left knee     Problem List Patient Active Problem List   Diagnosis Date Noted   Left knee pain 08/16/2020   Other fatigue 04/13/2020   Shortness of breath on exertion 04/13/2020   Screening for depression 04/13/2020   Vitamin D deficiency 04/13/2020   At risk for heart disease 04/13/2020   Diabetes mellitus ([facility] 04/13/2020   Pain in finger of left hand 04/05/2020   Nonallopathic lesion of sacral region 10/19/2019   Nonallopathic lesion of lumbar region 10/19/2019   Nonallopathic lesion of thoracic region 10/19/2019   Degenerative disc disease, lumbar 09/29/2019   Morbid obesity ([facility] 09/09/2019   Abnormal weight gain 05/25/2015   Hyperlipidemia, mixed 05/25/2015   Abdominal pain, chronic, right lower quadrant 01/13/2014   Hypertension associated with diabetes ([facility] 12/08/2009   BACK PAIN 12/08/2009   INSOMNIA 01/18/2008   GAD (generalized anxiety disorder) 09/04/2007   SORE THROAT 09/04/2007   INTERMITTENT VERTIGO 03/09/2007   ELEVATED BLOOD PRESSURE WITHOUT DIAGNOSIS OF HYPERTENSION 03/09/2007   LLyndee Hensen PT, DPT 9:24 PM  11/07/20    [facility]  [place], [place], [address] Phone: [phone]   Fax:  [phone]  Name: Tony [name] MRN: [id number] Date of Birth: 1975-08-09

## 2020-11-13 ENCOUNTER — Ambulatory Visit (INDEPENDENT_AMBULATORY_CARE_PROVIDER_SITE_OTHER): Payer: BC Managed Care – PPO | Admitting: Family Medicine

## 2020-11-13 ENCOUNTER — Encounter (INDEPENDENT_AMBULATORY_CARE_PROVIDER_SITE_OTHER): Payer: Self-pay | Admitting: Family Medicine

## 2020-11-13 ENCOUNTER — Telehealth (INDEPENDENT_AMBULATORY_CARE_PROVIDER_SITE_OTHER): Payer: BC Managed Care – PPO | Admitting: Family Medicine

## 2020-11-13 ENCOUNTER — Other Ambulatory Visit: Payer: Self-pay

## 2020-11-13 DIAGNOSIS — Z6839 Body mass index (BMI) 39.0-39.9, adult: Secondary | ICD-10-CM

## 2020-11-13 DIAGNOSIS — F3289 Other specified depressive episodes: Secondary | ICD-10-CM | POA: Diagnosis not present

## 2020-11-13 NOTE — Progress Notes (Signed)
TeleHealth Visit:  Due to the COVID-19 pandemic, this visit was completed with telemedicine (audio/video) technology to reduce patient and provider exposure as well as to preserve personal protective equipment.   Tony [name] has verbally consented to this TeleHealth visit. The patient is located at home, the provider is located at the [facility] office. The participants in this visit include the listed provider and patient. The visit was conducted today via video.   Chief Complaint: OBESITY Tony [name] is here to discuss his progress with his obesity treatment plan along with follow-up of his obesity related diagnoses. Tony [name] is on keeping a food journal and adhering to recommended goals of 1400-1700 calories and 100 grams protein and states he is following his eating plan approximately 70% of the time. Tony [name] states he is walking and weight training 20-60 minutes 3-4 times per week.  Today's visit was #: 12 Starting weight: 270 lbs Starting date: 04/13/2020  Interim History: Tony [name] isn't feeling well today- virtual appt. Since returning from a trip, he has been trying to get back into journaling. Pt is staying within calories and getting protein in most days (calories 6:7 days and protein 7:7 days). He is trying to be more mindful of snack options. He is doing protein shake or bar and pirates booty for snacks.  Subjective:   1. Other depression with emotional eating Tony [name] hasn't started Wellbutrin yet. He is seeing psych for medication management.  Assessment/Plan:   1. Other depression with emotional eating Pt is to start Wellbutrin and MyChart with any questions or issues (discussed possible side effects and reviewed meds today).  2. Obesity with current BMI 37.5  Tony [name] is currently in the action stage of change. As such, his goal is to continue with weight loss efforts. He has agreed to keeping a food journal and adhering to recommended goals of 1400-1700 calories and  115+ grams protein.   Exercise goals:  As is- Pt is to keep in mind to increase resistance training intensity.  Behavioral modification strategies: increasing lean protein intake, meal planning and cooking strategies, keeping healthy foods in the home, and planning for success.  Tony [name] has agreed to follow-up with our clinic in 2 weeks. He was informed of the importance of frequent follow-up visits to maximize his success with intensive lifestyle modifications for his multiple health conditions.  Objective:   VITALS: Per patient if applicable, see vitals. GENERAL: Alert and in no acute distress. CARDIOPULMONARY: No increased WOB. Speaking in clear sentences.  PSYCH: Pleasant and cooperative. Speech normal rate and rhythm. Affect is appropriate. Insight and judgement are appropriate. Attention is focused, linear, and appropriate.  NEURO: Oriented as arrived to appointment on time with no prompting.   Lab Results  Component Value Date   CREATININE 0.99 08/21/2020   BUN 19 08/21/2020   NA 136 08/21/2020   K 4.6 08/21/2020   CL 103 08/21/2020   CO2 23 08/21/2020   Lab Results  Component Value Date   ALT 25 08/21/2020   AST 28 08/21/2020   ALKPHOS 64 08/21/2020   BILITOT 0.4 08/21/2020   Lab Results  Component Value Date   HGBA1C 5.5 07/10/2020   HGBA1C 5.9 03/22/2020   HGBA1C 6.2 07/02/2019   HGBA1C 5.8 11/13/2018   HGBA1C 5.6 06/24/2018   Lab Results  Component Value Date   INSULIN 14.0 07/10/2020   INSULIN 35.9 (H) 04/13/2020   Lab Results  Component Value Date   TSH 1.88 03/22/2020   Lab Results  Component Value Date   CHOL 163 08/21/2020   HDL 41.20 08/21/2020   LDLCALC 99 08/21/2020   LDLDIRECT 157.0 07/02/2019   TRIG 112.0 08/21/2020   CHOLHDL 4 08/21/2020   Lab Results  Component Value Date   VD25OH 47.1 07/10/2020   VD25OH 33.4 04/13/2020   Lab Results  Component Value Date   WBC 5.8 03/22/2020   HGB 15.5 03/22/2020   HCT 46.0 03/22/2020   MCV  93.4 03/22/2020   PLT 141.0 (L) 03/22/2020   No results found for: IRON, TIBC, FERRITIN  Attestation Statements:   Reviewed by clinician on day of visit: allergies, medications, problem list, medical history, surgical history, family history, social history, and previous encounter notes.  Time spent on visit including pre-visit chart review and post-visit charting and care was 15 minutes.   Coral Ceo, CMA, am acting as transcriptionist for Coralie Common, MD.   I have reviewed the above documentation for accuracy and completeness, and I agree with the above. - Coralie Common, MD

## 2020-11-14 ENCOUNTER — Other Ambulatory Visit: Payer: Self-pay

## 2020-11-14 ENCOUNTER — Ambulatory Visit: Payer: BC Managed Care – PPO | Admitting: Physical Therapy

## 2020-11-14 ENCOUNTER — Encounter: Payer: Self-pay | Admitting: Physical Therapy

## 2020-11-14 DIAGNOSIS — G8929 Other chronic pain: Secondary | ICD-10-CM | POA: Diagnosis not present

## 2020-11-14 DIAGNOSIS — M545 Low back pain, unspecified: Secondary | ICD-10-CM | POA: Diagnosis not present

## 2020-11-14 DIAGNOSIS — M25562 Pain in left knee: Secondary | ICD-10-CM

## 2020-11-14 NOTE — Therapy (Signed)
[facility] [address] [place], [place], [address] Phone: [phone]   Fax:  [phone]  Physical Therapy Treatment  Patient Details  Name: Tony [name] MRN: [id number] Date of Birth: 03-19-1975 Referring Provider (PT): Charlann Boxer   Encounter Date: 11/14/2020   PT End of Session - 11/14/20 0847     Visit Number 3    Number of Visits 16    Date for PT Re-Evaluation 12/21/20    Authorization Type BCBS    PT Start Time 0845    PT Stop Time 0930    PT Time Calculation (min) 45 min    Activity Tolerance Patient tolerated treatment well    Behavior During Therapy [facility] for tasks assessed/performed             Past Medical History:  Diagnosis Date   Alcohol abuse 2016/2017   Pt states he was self medicating his anxiety.  Quit 04/2015.  Minimal intake as of 05/2017 f/u.   Anal fissure    Anxiety and depression    Back pain    Borderline diabetes    Erectile dysfunction    cialis 24m qd-qod started by urol 09/2020   Hepatic steatosis 2015; 2016; 02/2016   +Alcohol has played a role.  CT abd showed fatty liver+ liver enzymes mildly elevated (chron viral hepatitis screening NEG).  Abd u/s 03/26/16 showed fatty liver, small GB polyps, o/w normal.   History of kidney stones    [facility]   HTN (hypertension)    Hx of colonic polyp - ssp 02/11/2014   No polyps 02/2020->recall 10 yrs   Hyperlipidemia    Statin started 05/2018   Insomnia    Obesity, Class II, BMI 35-39.9    Psoriasis    Secondary male hypogonadism    clomiphene 06/2019: normalized on clomiphene 08/2019   Shortness of breath on exertion     Past Surgical History:  Procedure Laterality Date   APPENDECTOMY     COLONOSCOPY  03/10/2020   No polyps. Recall 2032.   COLONOSCOPY W/ BIOPSIES  02/03/14   Cecal polyp (sessile serrated polyp w/out dysplasia) and posterior anal fissure; repeat TCS 2020 per Dr. GCarlean Purl  HAND SURGERY Left 2001   broke left pinkie finger    LAPAROSCOPIC APPENDECTOMY N/A 02/24/2015   Procedure: APPENDECTOMY LAPAROSCOPIC;  Surgeon: DAlphonsa Overall MD;  Location: [facility];  Service: General;  Laterality: N/A;   right ankle     2014    There were no vitals filed for this visit.   Subjective Assessment - 11/14/20 0846     Subjective Pt states minimal pain in knee, back also doing ok, states stiffness in AM, but better with movement.    Currently in Pain? No/denies                               [facility] 11[facility]14[facility] 0001       Exercises   Exercises Lumbar      Lumbar Exercises: Stretches   Active Hamstring Stretch 3 reps;30 seconds    Active Hamstring Stretch Limitations seated    Single Knee to Chest Stretch --    Lower Trunk Rotation --    Pelvic Tilt 15 reps    Piriformis Stretch 3 reps;30 seconds    Piriformis Stretch Limitations supine fig 4 ;      Lumbar Exercises: Aerobic   Recumbent Bike L2 x  5 min;      Lumbar Exercises: Standing   Functional Squats 20 reps    Functional Squats Limitations 25lb    Lifting From 12";5 reps    Lifting Weights (lbs) 20      Lumbar Exercises: Seated   Long Arc Quad on Chair 15 reps;Left    LAQ on Chair Weights (lbs) 4    Sit to Stand --      Lumbar Exercises: Supine   Bridge 20 reps    Straight Leg Raise --    Straight Leg Raises Limitations --      Manual Therapy   Manual Therapy Joint mobilization;Soft tissue mobilization;Passive ROM    Manual therapy comments --    Joint Mobilization L knee: patella mobs, Fibular glides    Soft tissue mobilization IASTm to quad, lateral quad and ITB;    Passive ROM for full knee flex/ext and tibial rotaion                       PT Short Term Goals - 11/14/20 0847       PT SHORT TERM GOAL #1   Title Pt to be independent wtih initial HEP    Time 2    Period Weeks    Status Achieved    Target Date 11/09/20               PT Long Term Goals - 11/07/20 2108        PT LONG TERM GOAL #1   Title Pt to be independent with final HEP    Time 8    Period Weeks    Status New    Target Date 12/21/20      PT LONG TERM GOAL #2   Title Pt to report decreased pain in low back to 0-2/10 in mornings and with activity.    Time 8    Period Weeks    Status New    Target Date 12/21/20      PT LONG TERM GOAL #3   Title Pt to be reprot decreased pain in L knee to 0-2/10 with ROM, strengthening ,stairs, and stability exercises.    Time 8    Period Weeks    Status New    Target Date 12/21/20                   Plan - 11/14/20 0958     Clinical Impression Statement Pt with slight improvment of pain in L knee today, less tender and pain with lateral pressure. He does have mild patella maltracking and decreased patella mobility. Will continue to benefit from manual therapy for this. He has good ability for squats lateral movement with strengthening today, without pain. continued education on importance of core strengthening and stability, requires cuing with squats for improved trunk control. Pt to benefit from continued care.    Personal Factors and Comorbidities Time since onset of injury/illness/exacerbation    Examination-Activity Limitations Lift;Stand;Bend;Carry    Examination-Participation Restrictions Cleaning;Yard Work;Community Activity;Shop    Stability/Clinical Decision Making Stable/Uncomplicated    Rehab Potential Good    PT Frequency 2x / week    PT Duration 8 weeks    PT Treatment/Interventions ADLs/Self Care Home Management;Cryotherapy;Scientist, product/process development;Iontophoresis 73m/ml Dexamethasone;Moist Heat;Traction;Ultrasound;Gait training;Stair training;Functional mobility training;Therapeutic activities;Balance training;Therapeutic exercise;Neuromuscular re-education;Patient/family education;Manual techniques;Passive range of motion;Dry needling;Taping;Spinal Manipulations;Joint Manipulations    PT Home Exercise Plan [email or web address]    Consulted and Agree with Plan of Care Patient  Patient will benefit from skilled therapeutic intervention in order to improve the following deficits and impairments:  Pain, Decreased mobility, Increased muscle spasms, Decreased strength, Decreased range of motion, Impaired flexibility  Visit Diagnosis: Chronic bilateral low back pain without sciatica  Chronic pain of left knee     Problem List Patient Active Problem List   Diagnosis Date Noted   Left knee pain 08/16/2020   Other fatigue 04/13/2020   Shortness of breath on exertion 04/13/2020   Screening for depression 04/13/2020   Vitamin D deficiency 04/13/2020   At risk for heart disease 04/13/2020   Diabetes mellitus ([place]) 04/13/2020   Pain in finger of left hand 04/05/2020   Nonallopathic lesion of sacral region 10/19/2019   Nonallopathic lesion of lumbar region 10/19/2019   Nonallopathic lesion of thoracic region 10/19/2019   Degenerative disc disease, lumbar 09/29/2019   Morbid obesity ([place]) 09/09/2019   Abnormal weight gain 05/25/2015   Hyperlipidemia, mixed 05/25/2015   Abdominal pain, chronic, right lower quadrant 01/13/2014   Hypertension associated with diabetes ([place]) 12/08/2009   BACK PAIN 12/08/2009   INSOMNIA 01/18/2008   GAD (generalized anxiety disorder) 09/04/2007   SORE THROAT 09/04/2007   INTERMITTENT VERTIGO 03/09/2007   ELEVATED BLOOD PRESSURE WITHOUT DIAGNOSIS OF HYPERTENSION 03/09/2007    Lyndee Hensen, PT, DPT 10:01 AM  11/14/20    [facility] [address] [place], [place], [address] Phone: [phone]   Fax:  [phone]  Name: Tony [name] MRN: [id number] Date of Birth: 04-06-1975

## 2020-11-21 ENCOUNTER — Ambulatory Visit: Payer: BC Managed Care – PPO | Admitting: Physical Therapy

## 2020-11-21 ENCOUNTER — Encounter: Payer: Self-pay | Admitting: Physical Therapy

## 2020-11-21 ENCOUNTER — Other Ambulatory Visit (INDEPENDENT_AMBULATORY_CARE_PROVIDER_SITE_OTHER): Payer: Self-pay | Admitting: Family Medicine

## 2020-11-21 ENCOUNTER — Encounter (INDEPENDENT_AMBULATORY_CARE_PROVIDER_SITE_OTHER): Payer: Self-pay

## 2020-11-21 ENCOUNTER — Other Ambulatory Visit: Payer: Self-pay

## 2020-11-21 DIAGNOSIS — M25562 Pain in left knee: Secondary | ICD-10-CM | POA: Diagnosis not present

## 2020-11-21 DIAGNOSIS — M545 Low back pain, unspecified: Secondary | ICD-10-CM

## 2020-11-21 DIAGNOSIS — E1169 Type 2 diabetes mellitus with other specified complication: Secondary | ICD-10-CM

## 2020-11-21 DIAGNOSIS — G8929 Other chronic pain: Secondary | ICD-10-CM | POA: Diagnosis not present

## 2020-11-21 NOTE — Therapy (Signed)
[facility] [address] [place], [place], [address] Phone: [phone]   Fax:  [phone]  Physical Therapy Treatment  Patient Details  Name: Tony [name] MRN: [id number] Date of Birth: February 13, 1976 Referring Provider (PT): Charlann Boxer   Encounter Date: 11/21/2020   PT End of Session - 11/21/20 1429     Visit Number 4    Number of Visits 16    Date for PT Re-Evaluation 12/21/20    Authorization Type BCBS    PT Start Time [id number]    PT Stop Time 0930    PT Time Calculation (min) 44 min    Activity Tolerance Patient tolerated treatment well    Behavior During Therapy [facility] for tasks assessed/performed             Past Medical History:  Diagnosis Date   Alcohol abuse 2016/2017   Pt states he was self medicating his anxiety.  Quit 04/2015.  Minimal intake as of 05/2017 f/u.   Anal fissure    Anxiety and depression    Back pain    Borderline diabetes    Erectile dysfunction    cialis 28m qd-qod started by urol 09/2020   Hepatic steatosis 2015; 2016; 02/2016   +Alcohol has played a role.  CT abd showed fatty liver+ liver enzymes mildly elevated (chron viral hepatitis screening NEG).  Abd u/s 03/26/16 showed fatty liver, small GB polyps, o/w normal.   History of kidney stones    [facility]   HTN (hypertension)    Hx of colonic polyp - ssp 02/11/2014   No polyps 02/2020->recall 10 yrs   Hyperlipidemia    Statin started 05/2018   Insomnia    Obesity, Class II, BMI 35-39.9    Psoriasis    Secondary male hypogonadism    clomiphene 06/2019: normalized on clomiphene 08/2019   Shortness of breath on exertion     Past Surgical History:  Procedure Laterality Date   APPENDECTOMY     COLONOSCOPY  03/10/2020   No polyps. Recall 2032.   COLONOSCOPY W/ BIOPSIES  02/03/14   Cecal polyp (sessile serrated polyp w/out dysplasia) and posterior anal fissure; repeat TCS 2020 per Dr. GCarlean Purl  HAND SURGERY Left 2001   broke left pinkie finger    LAPAROSCOPIC APPENDECTOMY N/A 02/24/2015   Procedure: APPENDECTOMY LAPAROSCOPIC;  Surgeon: DAlphonsa Overall MD;  Location: [facility];  Service: General;  Laterality: N/A;   right ankle     2014    There were no vitals filed for this visit.   Subjective Assessment - 11/21/20 1429     Subjective Pt states less pain in back and knee. He was able to go hiking over the weekend, with minimal/no pain.    Currently in Pain? No/denies    Pain Score 0-No pain                               [facility] Adult PT Treatment/Exercise - 11/21/20 0001       Exercises   Exercises Lumbar      Lumbar Exercises: Stretches   Active Hamstring Stretch 3 reps;30 seconds    Active Hamstring Stretch Limitations seated    Single Knee to Chest Stretch 2 reps;20 seconds;Right;Left    Pelvic Tilt --    Piriformis Stretch 3 reps;30 seconds    Piriformis Stretch Limitations seated      Lumbar Exercises: Aerobic   Recumbent Bike L2 x  6 min;      Lumbar Exercises: Standing   Functional Squats 20 reps    Functional Squats Limitations 25lb    Lifting --    Lifting Weights (lbs) --    Other Standing Lumbar Exercises Paloff PRess double green TB x 15 bil;    Other Standing Lumbar Exercises Step ups 12 in box x 10 ea bil;  Step downs 6 in x 10 ea bil;      Lumbar Exercises: Seated   Long Arc Quad on Chair --    LAQ on Chair Weights (lbs) --      Lumbar Exercises: Supine   Bridge 10 reps    Single Leg Bridge 10 reps      Manual Therapy   Manual Therapy Joint mobilization;Soft tissue mobilization;Passive ROM    Joint Mobilization L knee: patella mobs, Fibular glides    Soft tissue mobilization IASTm to central quad, lateral quad    Passive ROM --                       PT Short Term Goals - 11/14/20 0847       PT SHORT TERM GOAL #1   Title Pt to be independent wtih initial HEP    Time 2    Period Weeks    Status Achieved    Target Date 11/09/20               PT Long  Term Goals - 11/07/20 2108       PT LONG TERM GOAL #1   Title Pt to be independent with final HEP    Time 8    Period Weeks    Status New    Target Date 12/21/20      PT LONG TERM GOAL #2   Title Pt to report decreased pain in low back to 0-2/10 in mornings and with activity.    Time 8    Period Weeks    Status New    Target Date 12/21/20      PT LONG TERM GOAL #3   Title Pt to be reprot decreased pain in L knee to 0-2/10 with ROM, strengthening ,stairs, and stability exercises.    Time 8    Period Weeks    Status New    Target Date 12/21/20                   Plan - 11/21/20 1430     Clinical Impression Statement Pt with improving ability for ther ex, and higher level strengthening for core and knee, without pain today. He did "test" knee pain in sitting, no pain reproduced with the rotation of his knee that usually causes the pain. Pt progressing well, with improving strength and function. Plan to continue strength and manual as needed for pain and tightness in knee.    Personal Factors and Comorbidities Time since onset of injury/illness/exacerbation    Examination-Activity Limitations Lift;Stand;Bend;Carry    Examination-Participation Restrictions Cleaning;Yard Work;Community Activity;Shop    Stability/Clinical Decision Making Stable/Uncomplicated    Rehab Potential Good    PT Frequency 2x / week    PT Duration 8 weeks    PT Treatment/Interventions ADLs/Self Care Home Management;Cryotherapy;Scientist, product/process development;Iontophoresis 23m/ml Dexamethasone;Moist Heat;Traction;Ultrasound;Gait training;Stair training;Functional mobility training;Therapeutic activities;Balance training;Therapeutic exercise;Neuromuscular re-education;Patient/family education;Manual techniques;Passive range of motion;Dry needling;Taping;Spinal Manipulations;Joint Manipulations    PT Home Exercise Plan [email or web address]   Consulted and Agree with Plan of Care Patient  Patient will benefit from skilled therapeutic intervention in order to improve the following deficits and impairments:  Pain, Decreased mobility, Increased muscle spasms, Decreased strength, Decreased range of motion, Impaired flexibility  Visit Diagnosis: Chronic bilateral low back pain without sciatica  Chronic pain of left knee     Problem List Patient Active Problem List   Diagnosis Date Noted   Left knee pain 08/16/2020   Other fatigue 04/13/2020   Shortness of breath on exertion 04/13/2020   Screening for depression 04/13/2020   Vitamin D deficiency 04/13/2020   At risk for heart disease 04/13/2020   Diabetes mellitus ([place]) 04/13/2020   Pain in finger of left hand 04/05/2020   Nonallopathic lesion of sacral region 10/19/2019   Nonallopathic lesion of lumbar region 10/19/2019   Nonallopathic lesion of thoracic region 10/19/2019   Degenerative disc disease, lumbar 09/29/2019   Morbid obesity ([place]) 09/09/2019   Abnormal weight gain 05/25/2015   Hyperlipidemia, mixed 05/25/2015   Abdominal pain, chronic, right lower quadrant 01/13/2014   Hypertension associated with diabetes ([place]) 12/08/2009   BACK PAIN 12/08/2009   INSOMNIA 01/18/2008   GAD (generalized anxiety disorder) 09/04/2007   SORE THROAT 09/04/2007   INTERMITTENT VERTIGO 03/09/2007   ELEVATED BLOOD PRESSURE WITHOUT DIAGNOSIS OF HYPERTENSION 03/09/2007    Lyndee Hensen, PT, DPT 2:33 PM  11/21/20    [facility] [address] [place], [place], [address] Phone: [phone]   Fax:  [phone]  Name: SAMPSON SELF MRN: [id number] Date of Birth: 02-16-1976

## 2020-11-21 NOTE — Telephone Encounter (Signed)
Message sent to pt-CAS 

## 2020-11-27 ENCOUNTER — Encounter (INDEPENDENT_AMBULATORY_CARE_PROVIDER_SITE_OTHER): Payer: Self-pay | Admitting: Family Medicine

## 2020-11-27 ENCOUNTER — Ambulatory Visit (INDEPENDENT_AMBULATORY_CARE_PROVIDER_SITE_OTHER): Payer: BC Managed Care – PPO | Admitting: Family Medicine

## 2020-11-27 ENCOUNTER — Other Ambulatory Visit: Payer: Self-pay

## 2020-11-27 ENCOUNTER — Other Ambulatory Visit (INDEPENDENT_AMBULATORY_CARE_PROVIDER_SITE_OTHER): Payer: Self-pay | Admitting: Family Medicine

## 2020-11-27 VITALS — BP 130/77 | HR 70 | Temp 98.4°F | Ht 69.0 in | Wt 251.0 lb

## 2020-11-27 DIAGNOSIS — Z9189 Other specified personal risk factors, not elsewhere classified: Secondary | ICD-10-CM | POA: Diagnosis not present

## 2020-11-27 DIAGNOSIS — E559 Vitamin D deficiency, unspecified: Secondary | ICD-10-CM | POA: Diagnosis not present

## 2020-11-27 DIAGNOSIS — E1169 Type 2 diabetes mellitus with other specified complication: Secondary | ICD-10-CM

## 2020-11-27 DIAGNOSIS — Z6839 Body mass index (BMI) 39.0-39.9, adult: Secondary | ICD-10-CM | POA: Diagnosis not present

## 2020-11-27 MED ORDER — METFORMIN HCL 500 MG PO TABS: ORAL_TABLET | ORAL | 0 refills | Status: DC

## 2020-11-27 MED ORDER — VITAMIN D (ERGOCALCIFEROL) 1.25 MG (50000 UNIT) PO CAPS: ORAL_CAPSULE | ORAL | 0 refills | Status: DC

## 2020-11-28 ENCOUNTER — Encounter: Payer: BC Managed Care – PPO | Admitting: Physical Therapy

## 2020-11-28 NOTE — Progress Notes (Signed)
Chief Complaint:   OBESITY Tony [name] is here to discuss his progress with his obesity treatment plan along with follow-up of his obesity related diagnoses. Tony [name] is on keeping a food journal and adhering to recommended goals of 1400-1700 calories and 115+ grams protein and states he is following his eating plan approximately 25% of the time. Tony [name] states he is walking, hiking, and weights 20-60 minutes 3-7 times per week.  Today's visit was #: 13 Starting weight: 270 lbs Starting date: 04/13/2020 Today's weight: 251 lbs Today's date: 11/27/2020 Total lbs lost to date: 19 Total lbs lost since last in-office visit: 3  Interim History: Tony [name] has been indulging in some carbohydrates over the last few weeks. He did do more physical activity over the last few weeks as well. He had dental work as wee and was limited in food consistency. Pt is going to the beach for a week. He hasn't started Wellbutrin yet but plans to.  Subjective:   1. Type 2 diabetes mellitus with other specified complication, without long-term current use of insulin (HCC) Tony [name] is on Metformin daily. His last A1c was 5.5 and insulin level 14.0.  2. Vitamin D deficiency Pt denies nausea, vomiting, and muscle weakness but notes fatigue. He is on prescription Vit D. His last Vit D level was 47.1.  3. At risk for side effect of medication Tony [name] is at risk for side effects of medication due to starting Wellbutrin.  Assessment/Plan:   1. Type 2 diabetes mellitus with other specified complication, without long-term current use of insulin (HCC) Continue Metformin 500 mg as directed. Good blood sugar control is important to decrease the likelihood of diabetic complications such as nephropathy, neuropathy, limb loss, blindness, coronary artery disease, and death. Intensive lifestyle modification including diet, exercise and weight loss are the first line of treatment for diabetes.   Refill- metFORMIN (GLUCOPHAGE) 500 MG  tablet; 1/2 tab with breakfast 1/2 with lunch  Dispense: 30 tablet; Refill: 0  2. Vitamin D deficiency Low Vitamin D level contributes to fatigue and are associated with obesity, breast, and colon cancer. He agrees to continue to take prescription Vitamin D 50,000 IU every week and will follow-up for routine testing of Vitamin D, at least 2-3 times per year to avoid over-replacement.  Refill- Vitamin D, Ergocalciferol, (DRISDOL) 1.25 MG (50000 UNIT) CAPS capsule; TAKE ONE CAPSULE BY MOUTH EVERY 7 DAYS  Dispense: 4 capsule; Refill: 0  3. At risk for side effect of medication [place] was given approximately 15 minutes of drug side effect counseling today.  We discussed side effect possibility and risk versus benefits. Tony [name] agreed to the medication and will contact this office if these side effects are intolerable.  Repetitive spaced learning was employed today to elicit superior memory formation and behavioral change.   4. Obesity with current BMI of 37.2  Tony [name] is currently in the action stage of change. As such, his goal is to continue with weight loss efforts. He has agreed to keeping a food journal and adhering to recommended goals of 1400-1700 calories and 115+ grams protein.   Exercise goals:  As is- Add one extra day of resistance training per week.  Behavioral modification strategies: increasing lean protein intake, meal planning and cooking strategies, travel eating strategies, and keeping a strict food journal.  Tony [name] has agreed to follow-up with our clinic in 3-4 weeks. He was informed of the importance of frequent follow-up visits to maximize his success with intensive lifestyle modifications for his multiple  health conditions.   Objective:   Blood pressure 130/77, pulse 70, temperature 98.4 F (36.9 C), height 5' 9"  (1.753 m), weight 251 lb (113.9 kg). Body mass index is 37.07 kg/m.  General: Cooperative, alert, well developed, in no acute distress. HEENT: Conjunctivae and  lids unremarkable. Cardiovascular: Regular rhythm.  Lungs: Normal work of breathing. Neurologic: No focal deficits.   Lab Results  Component Value Date   CREATININE 0.99 08/21/2020   BUN 19 08/21/2020   NA 136 08/21/2020   K 4.6 08/21/2020   CL 103 08/21/2020   CO2 23 08/21/2020   Lab Results  Component Value Date   ALT 25 08/21/2020   AST 28 08/21/2020   ALKPHOS 64 08/21/2020   BILITOT 0.4 08/21/2020   Lab Results  Component Value Date   HGBA1C 5.5 07/10/2020   HGBA1C 5.9 03/22/2020   HGBA1C 6.2 07/02/2019   HGBA1C 5.8 11/13/2018   HGBA1C 5.6 06/24/2018   Lab Results  Component Value Date   INSULIN 14.0 07/10/2020   INSULIN 35.9 (H) 04/13/2020   Lab Results  Component Value Date   TSH 1.88 03/22/2020   Lab Results  Component Value Date   CHOL 163 08/21/2020   HDL 41.20 08/21/2020   LDLCALC 99 08/21/2020   LDLDIRECT 157.0 07/02/2019   TRIG 112.0 08/21/2020   CHOLHDL 4 08/21/2020   Lab Results  Component Value Date   VD25OH 47.1 07/10/2020   VD25OH 33.4 04/13/2020   Lab Results  Component Value Date   WBC 5.8 03/22/2020   HGB 15.5 03/22/2020   HCT 46.0 03/22/2020   MCV 93.4 03/22/2020   PLT 141.0 (L) 03/22/2020    Attestation Statements:   Reviewed by clinician on day of visit: allergies, medications, problem list, medical history, surgical history, family history, social history, and previous encounter notes.  Coral Ceo, CMA, am acting as transcriptionist for Coralie Common, MD.   I have reviewed the above documentation for accuracy and completeness, and I agree with the above. - Coralie Common, MD

## 2020-12-03 ENCOUNTER — Other Ambulatory Visit (INDEPENDENT_AMBULATORY_CARE_PROVIDER_SITE_OTHER): Payer: Self-pay | Admitting: Family Medicine

## 2020-12-03 DIAGNOSIS — F3289 Other specified depressive episodes: Secondary | ICD-10-CM

## 2020-12-04 ENCOUNTER — Encounter (INDEPENDENT_AMBULATORY_CARE_PROVIDER_SITE_OTHER): Payer: Self-pay | Admitting: Emergency Medicine

## 2020-12-04 NOTE — Progress Notes (Signed)
Tony [name] [place] [address] [place] Phone: [phone] Subjective:   Tony [name], am serving as a scribe for Dr. Hulan Saas. This visit occurred during the SARS-CoV-2 public health emergency.  Safety protocols were in place, including screening questions prior to the visit, additional usage of staff PPE, and extensive cleaning of exam room while observing appropriate contact time as indicated for disinfecting solutions.   I'm seeing this patient by the request  of:  McGowen, Adrian Blackwater, MD  CC: Low back pain follow-up  PRF:[id number]  Tony [name] is a 45 y.o. male coming in with complaint of back and neck pain. OMT and L knee pain 10/02/2020. Epidural March 2022. Patient states pain remain the same. Left knee doing well. Just completed physical therapy.  Patient states that overall is doing much better.  Patient is having some tightness of the lower back since he has started working out on a more regular basis.  Patient was not going to the gym regularly and 5 times last week.  More of a tightness.  No radicular symptoms at the moment.  Medications patient has been prescribed: Gabapentin  Taking:           Past Medical History:  Diagnosis Date   Alcohol abuse 2016/2017   Pt states he was self medicating his anxiety.  Quit 04/2015.  Minimal intake as of 05/2017 f/u.   Anal fissure    Anxiety and depression    Back pain    Borderline diabetes    Erectile dysfunction    cialis 64m qd-qod started by urol 09/2020   Hepatic steatosis 2015; 2016; 02/2016   +Alcohol has played a role.  CT abd showed fatty liver+ liver enzymes mildly elevated (chron viral hepatitis screening NEG).  Abd u/s 03/26/16 showed fatty liver, small GB polyps, o/w normal.   History of kidney stones    [facility]   HTN (hypertension)    Hx of colonic polyp - ssp 02/11/2014   No polyps 02/2020->recall 10 yrs   Hyperlipidemia    Statin started 05/2018    Insomnia    Obesity, Class II, BMI 35-39.9    Psoriasis    Secondary male hypogonadism    clomiphene 06/2019: normalized on clomiphene 08/2019   Shortness of breath on exertion     No Known Allergies   Review of Systems:  No headache, visual changes, nausea, vomiting, diarrhea, constipation, dizziness, abdominal pain, skin rash, fevers, chills, night sweats, weight loss, swollen lymph nodes, body aches, joint swelling, chest pain, shortness of breath, mood changes. POSITIVE muscle aches  Objective  Blood pressure 132/82, pulse 78, height 5' 9"  (1.753 m), weight 258 lb (117 kg), SpO2 93 %.   General: No apparent distress alert and oriented x3 mood and affect normal, dressed appropriately.  HEENT: Pupils equal, extraocular movements intact  Respiratory: Patient's speak in full sentences and does not appear short of breath  Cardiovascular: No lower extremity edema, non tender, no erythema  Low back exam does have some loss of lordosis.  Some tenderness to palpation in the paraspinal musculature.  Tightness with FABER test bilaterally.  Left knee unremarkable on exam today.  Osteopathic findings  C2 flexed rotated and side bent right T4 extended rotated and side bent left inhaled rib L2 flexed rotated and side bent right L5 flexed rotated and side bent left Sacrum right on right       Assessment and Plan:  Degenerative disc disease, lumbar  Patient is doing very well.  Has had the epidurals previously but has been making progress.  Patient is trying to work out a little bit too quickly again.  Discussed posture and ergonomics, which activities to do which wants to avoid.  Increase activity slowly.  Discussed icing regimen and home exercises.  Follow-up with me again in 6 to 8 weeks   Nonallopathic problems  Decision today to treat with OMT was based on Physical Exam  After verbal consent patient was treated with HVLA, ME, FPR techniques in cervical, rib, thoracic, lumbar, and  sacral  areas  Patient tolerated the procedure well with improvement in symptoms  Patient given exercises, stretches and lifestyle modifications  See medications in patient instructions if given  Patient will follow up in 4-8 weeks      The above documentation has been reviewed and is accurate and complete Lyndal Pulley, DO       Note: This dictation was prepared with Dragon dictation along with smaller phrase technology. Any transcriptional errors that result from this process are unintentional.

## 2020-12-04 NOTE — Telephone Encounter (Signed)
Mychart msg has been sent to patient to see if he have enough med till his next office visit

## 2020-12-05 ENCOUNTER — Other Ambulatory Visit: Payer: Self-pay

## 2020-12-05 ENCOUNTER — Ambulatory Visit: Payer: BC Managed Care – PPO | Admitting: Family Medicine

## 2020-12-05 ENCOUNTER — Encounter: Payer: Self-pay | Admitting: Physical Therapy

## 2020-12-05 ENCOUNTER — Ambulatory Visit: Payer: BC Managed Care – PPO | Admitting: Physical Therapy

## 2020-12-05 ENCOUNTER — Encounter: Payer: Self-pay | Admitting: Family Medicine

## 2020-12-05 VITALS — BP 132/82 | HR 78 | Ht 69.0 in | Wt 258.0 lb

## 2020-12-05 DIAGNOSIS — M25562 Pain in left knee: Secondary | ICD-10-CM

## 2020-12-05 DIAGNOSIS — M5136 Other intervertebral disc degeneration, lumbar region: Secondary | ICD-10-CM

## 2020-12-05 DIAGNOSIS — M9903 Segmental and somatic dysfunction of lumbar region: Secondary | ICD-10-CM | POA: Diagnosis not present

## 2020-12-05 DIAGNOSIS — M9904 Segmental and somatic dysfunction of sacral region: Secondary | ICD-10-CM

## 2020-12-05 DIAGNOSIS — M9908 Segmental and somatic dysfunction of rib cage: Secondary | ICD-10-CM | POA: Diagnosis not present

## 2020-12-05 DIAGNOSIS — M9901 Segmental and somatic dysfunction of cervical region: Secondary | ICD-10-CM

## 2020-12-05 DIAGNOSIS — G8929 Other chronic pain: Secondary | ICD-10-CM

## 2020-12-05 DIAGNOSIS — M9902 Segmental and somatic dysfunction of thoracic region: Secondary | ICD-10-CM | POA: Diagnosis not present

## 2020-12-05 DIAGNOSIS — M545 Low back pain, unspecified: Secondary | ICD-10-CM

## 2020-12-05 NOTE — Assessment & Plan Note (Signed)
Patient is doing very well.  Has had the epidurals previously but has been making progress.  Patient is trying to work out a little bit too quickly again.  Discussed posture and ergonomics, which activities to do which wants to avoid.  Increase activity slowly.  Discussed icing regimen and home exercises.  Follow-up with me again in 6 to 8 weeks

## 2020-12-05 NOTE — Patient Instructions (Signed)
Good to see you! Get back in the gym, but progress slowly See you again in 2-3 months

## 2020-12-05 NOTE — Therapy (Signed)
[facility] [address] [place], [place], [address] Phone: [phone]   Fax:  [phone]  Physical Therapy Treatment  Patient Details  Name: Tony [name] MRN: [id number] Date of Birth: Aug 05, 1975 Referring Provider (PT): Charlann Boxer   Encounter Date: 12/05/2020   PT End of Session - 12/05/20 1222     Visit Number 5    Number of Visits 16    Date for PT Re-Evaluation 12/21/20    Authorization Type BCBS    PT Start Time 0803    PT Stop Time 0848    PT Time Calculation (min) 45 min    Activity Tolerance Patient tolerated treatment well    Behavior During Therapy [facility] for tasks assessed/performed             Past Medical History:  Diagnosis Date   Alcohol abuse 2016/2017   Pt states he was self medicating his anxiety.  Quit 04/2015.  Minimal intake as of 05/2017 f/u.   Anal fissure    Anxiety and depression    Back pain    Borderline diabetes    Erectile dysfunction    cialis 96m qd-qod started by urol 09/2020   Hepatic steatosis 2015; 2016; 02/2016   +Alcohol has played a role.  CT abd showed fatty liver+ liver enzymes mildly elevated (chron viral hepatitis screening NEG).  Abd u/s 03/26/16 showed fatty liver, small GB polyps, o/w normal.   History of kidney stones    [facility]   HTN (hypertension)    Hx of colonic polyp - ssp 02/11/2014   No polyps 02/2020->recall 10 yrs   Hyperlipidemia    Statin started 05/2018   Insomnia    Obesity, Class II, BMI 35-39.9    Psoriasis    Secondary male hypogonadism    clomiphene 06/2019: normalized on clomiphene 08/2019   Shortness of breath on exertion     Past Surgical History:  Procedure Laterality Date   APPENDECTOMY     COLONOSCOPY  03/10/2020   No polyps. Recall 2032.   COLONOSCOPY W/ BIOPSIES  02/03/14   Cecal polyp (sessile serrated polyp w/out dysplasia) and posterior anal fissure; repeat TCS 2020 per Dr. GCarlean Purl  HAND SURGERY Left 2001   broke left pinkie finger    LAPAROSCOPIC APPENDECTOMY N/A 02/24/2015   Procedure: APPENDECTOMY LAPAROSCOPIC;  Surgeon: DAlphonsa Overall MD;  Location: [facility];  Service: General;  Laterality: N/A;   right ankle     2014    There were no vitals filed for this visit.   Subjective Assessment - 12/05/20 0806     Subjective Pt states knee doing better. Has had much less pain overall. Still "felt it a little" when adjusting position in bed. Back is "tight" today. He joined the gym and went every day last week, so he thinks he over did it.    Currently in Pain? Yes    Pain Score 0-No pain    Pain Location Knee    Pain Score 4    Pain Location Back    Pain Orientation Left;Right    Pain Descriptors / Indicators Aching    Pain Type Chronic pain    Pain Onset More than a month ago    Pain Frequency Intermittent                               [facility] Adult PT Treatment/Exercise - 12/05/20 0001  Exercises   Exercises Lumbar      Lumbar Exercises: Stretches   Active Hamstring Stretch 3 reps;30 seconds    Active Hamstring Stretch Limitations seated    Single Knee to Chest Stretch 2 reps;20 seconds;Right;Left    Lower Trunk Rotation 5 reps;10 seconds    Piriformis Stretch 3 reps;30 seconds    Piriformis Stretch Limitations supine      Lumbar Exercises: Aerobic   Recumbent Bike L2 x 6 min;      Lumbar Exercises: Standing   Functional Squats --    Functional Squats Limitations --    Other Standing Lumbar Exercises Paloff PRess double green TB x 15 bil;    Other Standing Lumbar Exercises Step ups 12 in box x 10 ea bil;  Step downs 6 in x 10 ea bil;      Lumbar Exercises: Supine   Bridge 10 reps    Single Leg Bridge 10 reps      Lumbar Exercises: Quadruped   Plank 30 sec x 4; education on form.      Manual Therapy   Manual Therapy Joint mobilization;Soft tissue mobilization;Passive ROM    Joint Mobilization L knee: patella mobs, Fibular glides    Soft tissue mobilization --                      PT Education - 12/05/20 1221     Education Details Reviewed final HEP, reviewed safe and suggested progressions for strengthening, exercise, and core strength for back pain.    Person(s) Educated Patient    Methods Explanation;Demonstration;Tactile cues;Verbal cues;Handout    Comprehension Verbalized understanding;Returned demonstration;Verbal cues required;Tactile cues required;Need further instruction              PT Short Term Goals - 11/14/20 0847       PT SHORT TERM GOAL #1   Title Pt to be independent wtih initial HEP    Time 2    Period Weeks    Status Achieved    Target Date 11/09/20               PT Long Term Goals - 12/05/20 1223       PT LONG TERM GOAL #1   Title Pt to be independent with final HEP    Time 8    Period Weeks    Status Achieved      PT LONG TERM GOAL #2   Title Pt to report decreased pain in low back to 0-2/10 in mornings and with activity.    Time 8    Period Weeks    Status Partially Met      PT LONG TERM GOAL #3   Title Pt to be reprot decreased pain in L knee to 0-2/10 with ROM, strengthening ,stairs, and stability exercises.    Time 8    Period Weeks    Status Achieved                   Plan - 12/05/20 1223     Clinical Impression Statement Pt with much improved knee pain. He is doing well with ability for strengthening, and pain is unable to be reproduced today. He does have mild back pain today, increased soreness likely from over doing activity at the gym. Discussed not over doing activity when starting out at the gym, safe exercises for back, and importance of continuing HEP. Pt has made good improvements, and is doing well wiht HEP for knee and back  pain. Pt has met goals at this time, and is ready for d/c to HEP, pt in agreement with plan.    Personal Factors and Comorbidities Time since onset of injury/illness/exacerbation    Examination-Activity Limitations Lift;Stand;Bend;Carry     Examination-Participation Restrictions Cleaning;Yard Work;Community Activity;Shop    Stability/Clinical Decision Making Stable/Uncomplicated    Rehab Potential Good    PT Frequency 2x / week    PT Duration 8 weeks    PT Treatment/Interventions ADLs/Self Care Home Management;Cryotherapy;Scientist, product/process development;Iontophoresis 64m/ml Dexamethasone;Moist Heat;Traction;Ultrasound;Gait training;Stair training;Functional mobility training;Therapeutic activities;Balance training;Therapeutic exercise;Neuromuscular re-education;Patient/family education;Manual techniques;Passive range of motion;Dry needling;Taping;Spinal Manipulations;Joint Manipulations    PT Home Exercise Plan [email or web address]   Consulted and Agree with Plan of Care Patient             Patient will benefit from skilled therapeutic intervention in order to improve the following deficits and impairments:  Pain, Decreased mobility, Increased muscle spasms, Decreased strength, Decreased range of motion, Impaired flexibility  Visit Diagnosis: Chronic bilateral low back pain without sciatica  Chronic pain of left knee     Problem List Patient Active Problem List   Diagnosis Date Noted   Left knee pain 08/16/2020   Other fatigue 04/13/2020   Shortness of breath on exertion 04/13/2020   Screening for depression 04/13/2020   Vitamin D deficiency 04/13/2020   At risk for heart disease 04/13/2020   Diabetes mellitus ([place] 04/13/2020   Pain in finger of left hand 04/05/2020   Nonallopathic lesion of sacral region 10/19/2019   Nonallopathic lesion of lumbar region 10/19/2019   Nonallopathic lesion of thoracic region 10/19/2019   Degenerative disc disease, lumbar 09/29/2019   Morbid obesity ([place] 09/09/2019   Abnormal weight gain 05/25/2015   Hyperlipidemia, mixed 05/25/2015   Abdominal pain, chronic, right lower quadrant 01/13/2014   Hypertension associated with diabetes ([place] 12/08/2009   BACK PAIN 12/08/2009    INSOMNIA 01/18/2008   GAD (generalized anxiety disorder) 09/04/2007   SORE THROAT 09/04/2007   INTERMITTENT VERTIGO 03/09/2007   ELEVATED BLOOD PRESSURE WITHOUT DIAGNOSIS OF HYPERTENSION 03/09/2007   LLyndee Hensen PT, DPT 12:29 PM  12/05/20   [place] [address]Phone: [phone]  Fax:  [phone] Name: Tony IWANICKIMRN: [id number] of Birth: [birth year] PHYSICAL THERAPY DISCHARGE SUMMARY  Visits from Start of Care: 5 Plan: Patient agrees to discharge.  Patient goals were met. Patient is being discharged due to meeting the stated rehab goals.     LLyndee Hensen PT, DPT 12:29 PM  12/05/20

## 2020-12-05 NOTE — Assessment & Plan Note (Signed)
Resolved

## 2020-12-13 ENCOUNTER — Telehealth: Payer: Self-pay

## 2020-12-13 NOTE — Telephone Encounter (Signed)
Patient refill request. Patient states [facility] has been trying since last week to get approval.  [facility] - [facility]  eszopiclone 3 MG TABS [[id number]]

## 2020-12-13 NOTE — Telephone Encounter (Signed)
Pt's last refill was 09/12/20 (90,1). He should have 1 refill on file

## 2020-12-13 NOTE — Telephone Encounter (Signed)
Pt was advised refill should be on file. He will contact pharmacy

## 2020-12-19 ENCOUNTER — Encounter (INDEPENDENT_AMBULATORY_CARE_PROVIDER_SITE_OTHER): Payer: Self-pay | Admitting: Family Medicine

## 2020-12-19 ENCOUNTER — Ambulatory Visit (INDEPENDENT_AMBULATORY_CARE_PROVIDER_SITE_OTHER): Payer: BC Managed Care – PPO

## 2020-12-19 ENCOUNTER — Ambulatory Visit (INDEPENDENT_AMBULATORY_CARE_PROVIDER_SITE_OTHER): Payer: BC Managed Care – PPO | Admitting: Family Medicine

## 2020-12-19 ENCOUNTER — Other Ambulatory Visit: Payer: Self-pay

## 2020-12-19 VITALS — BP 113/75 | HR 98 | Temp 98.1°F | Ht 69.0 in | Wt 251.0 lb

## 2020-12-19 DIAGNOSIS — E559 Vitamin D deficiency, unspecified: Secondary | ICD-10-CM | POA: Diagnosis not present

## 2020-12-19 DIAGNOSIS — Z23 Encounter for immunization: Secondary | ICD-10-CM | POA: Diagnosis not present

## 2020-12-19 DIAGNOSIS — R7303 Prediabetes: Secondary | ICD-10-CM

## 2020-12-19 DIAGNOSIS — Z6839 Body mass index (BMI) 39.0-39.9, adult: Secondary | ICD-10-CM

## 2020-12-19 MED ORDER — METFORMIN HCL 500 MG PO TABS: ORAL_TABLET | ORAL | 0 refills | Status: DC

## 2020-12-19 MED ORDER — VITAMIN D (ERGOCALCIFEROL) 1.25 MG (50000 UNIT) PO CAPS: ORAL_CAPSULE | ORAL | 0 refills | Status: DC

## 2020-12-19 NOTE — Progress Notes (Signed)
Chief Complaint:   OBESITY Tony [name] is here to discuss his progress with his obesity treatment plan along with follow-up of his obesity related diagnoses. Tony [name] is on keeping a food journal and adhering to recommended goals of 1400-1700 calories and 115+ grams protein and states he is following his eating plan approximately 50% of the time. Tony [name] states he is going to the gym 90 minutes 7 times per week.  Today's visit was #: 14 Starting weight: 270 lbs Starting date: 04/13/2020 Today's weight: 251 lbs Today's date: 12/19/2020 Total lbs lost to date: 19 Total lbs lost since last in-office visit: 0  Interim History: Over the last few weeks, Tony [name] has struggled with adhering to journaling. He joined a gym and went everyday for the first week, then missed the gym the second week, as well as struggled with adherence to journaling. He is looking for more snack options. Pt is planning on being here for the next few weeks.  Subjective:   1. Vitamin D deficiency Pt denies nausea, vomiting, and muscle weakness but notes fatigue. He is on prescription Vit D.  2. Pre-diabetes Tony [name]'s last A1c was 5.5 and insulin level 14.0. He denies side effects of Metformin.  Assessment/Plan:   1. Vitamin D deficiency Low Vitamin D level contributes to fatigue and are associated with obesity, breast, and colon cancer. He agrees to continue to take prescription Vitamin D 50,000 IU every week and will follow-up for routine testing of Vitamin D, at least 2-3 times per year to avoid over-replacement.  Refill- Vitamin D, Ergocalciferol, (DRISDOL) 1.25 MG (50000 UNIT) CAPS capsule; TAKE ONE CAPSULE BY MOUTH EVERY 7 DAYS  Dispense: 4 capsule; Refill: 0  2. Pre-diabetes Tony [name] will continue to work on weight loss, exercise, and decreasing simple carbohydrates to help decrease the risk of diabetes.   Refill- metFORMIN (GLUCOPHAGE) 500 MG tablet; 1/2 tab with breakfast 1/2 with lunch  Dispense: 30 tablet;  Refill: 0  3. Obesity with current BMI of 37.2  Tony [name] is currently in the action stage of change. As such, his goal is to continue with weight loss efforts. He has agreed to keeping a food journal and adhering to recommended goals of 1400-1700 calories and 1115+ grams protein.   Exercise goals:  As is  Behavioral modification strategies: increasing lean protein intake, meal planning and cooking strategies, and keeping healthy foods in the home.  Tony [name] has agreed to follow-up with our clinic in 3 weeks. He was informed of the importance of frequent follow-up visits to maximize his success with intensive lifestyle modifications for his multiple health conditions.   Objective:   Blood pressure 113/75, pulse 98, temperature 98.1 F (36.7 C), height 5' 9"  (1.753 m), weight 251 lb (113.9 kg), SpO2 100 %. Body mass index is 37.07 kg/m.  General: Cooperative, alert, well developed, in no acute distress. HEENT: Conjunctivae and lids unremarkable. Cardiovascular: Regular rhythm.  Lungs: Normal work of breathing. Neurologic: No focal deficits.   Lab Results  Component Value Date   CREATININE 0.99 08/21/2020   BUN 19 08/21/2020   NA 136 08/21/2020   K 4.6 08/21/2020   CL 103 08/21/2020   CO2 23 08/21/2020   Lab Results  Component Value Date   ALT 25 08/21/2020   AST 28 08/21/2020   ALKPHOS 64 08/21/2020   BILITOT 0.4 08/21/2020   Lab Results  Component Value Date   HGBA1C 5.5 07/10/2020   HGBA1C 5.9 03/22/2020   HGBA1C 6.2 07/02/2019   HGBA1C  5.8 11/13/2018   HGBA1C 5.6 06/24/2018   Lab Results  Component Value Date   INSULIN 14.0 07/10/2020   INSULIN 35.9 (H) 04/13/2020   Lab Results  Component Value Date   TSH 1.88 03/22/2020   Lab Results  Component Value Date   CHOL 163 08/21/2020   HDL 41.20 08/21/2020   LDLCALC 99 08/21/2020   LDLDIRECT 157.0 07/02/2019   TRIG 112.0 08/21/2020   CHOLHDL 4 08/21/2020   Lab Results  Component Value Date   VD25OH 47.1  07/10/2020   VD25OH 33.4 04/13/2020   Lab Results  Component Value Date   WBC 5.8 03/22/2020   HGB 15.5 03/22/2020   HCT 46.0 03/22/2020   MCV 93.4 03/22/2020   PLT 141.0 (L) 03/22/2020    Attestation Statements:   Reviewed by clinician on day of visit: allergies, medications, problem list, medical history, surgical history, family history, social history, and previous encounter notes.  Time spent on visit including pre-visit chart review and post-visit care and charting was 23 minutes.   Coral Ceo, CMA, am acting as transcriptionist for Coralie Common, MD.  I have reviewed the above documentation for accuracy and completeness, and I agree with the above. - Coralie Common, MD

## 2020-12-19 NOTE — Progress Notes (Signed)
After obtaining informed consent, the influenza immunization was given IM in the right deltoid by Leonor Liv, CMA. Pt instructed to remain in clinic for 15 mins and to report any adverse reactions to me immediately. Sw, cma

## 2020-12-20 DIAGNOSIS — F410 Panic disorder [episodic paroxysmal anxiety] without agoraphobia: Secondary | ICD-10-CM | POA: Diagnosis not present

## 2020-12-20 DIAGNOSIS — F411 Generalized anxiety disorder: Secondary | ICD-10-CM | POA: Diagnosis not present

## 2020-12-22 ENCOUNTER — Telehealth: Payer: Self-pay | Admitting: Family Medicine

## 2020-12-22 ENCOUNTER — Other Ambulatory Visit (INDEPENDENT_AMBULATORY_CARE_PROVIDER_SITE_OTHER): Payer: Self-pay | Admitting: Family Medicine

## 2020-12-22 DIAGNOSIS — E559 Vitamin D deficiency, unspecified: Secondary | ICD-10-CM

## 2020-12-22 MED ORDER — BUPROPION HCL ER (SR) 100 MG PO TB12: 100.0000 mg | ORAL_TABLET | Freq: Every day | ORAL | 0 refills | Status: DC

## 2020-12-22 NOTE — Telephone Encounter (Signed)
Pt called about Wellbutrin ordered by Dr. Leafy Ro. Her office is closed. He said he took for the first time yesterday. He has not taken today. He still feels jittery. He is asking if ok to cut pill in half to take today? Please call [phone]

## 2020-12-22 NOTE — Telephone Encounter (Signed)
Can't split this pill in half b/c it is sustained release. OK to send in rx for bupropion SR 100, 1 tab qd, #7 tabs no RF---this is the lowest dose of the sustained release form of bupropion. He is to take this instead of the 14m tab and this supply should last him until he can straighten things out with Dr. BLeafy Ro

## 2020-12-22 NOTE — Telephone Encounter (Signed)
Pt advised of med instructions, rx sent in.

## 2020-12-22 NOTE — Telephone Encounter (Signed)
Please review and advise.

## 2020-12-27 ENCOUNTER — Telehealth (INDEPENDENT_AMBULATORY_CARE_PROVIDER_SITE_OTHER): Payer: Self-pay | Admitting: Family Medicine

## 2020-12-27 NOTE — Telephone Encounter (Signed)
Dr. Jeani Sow

## 2021-01-08 ENCOUNTER — Other Ambulatory Visit: Payer: Self-pay

## 2021-01-08 ENCOUNTER — Telehealth (INDEPENDENT_AMBULATORY_CARE_PROVIDER_SITE_OTHER): Payer: BC Managed Care – PPO | Admitting: Family Medicine

## 2021-01-08 ENCOUNTER — Encounter (INDEPENDENT_AMBULATORY_CARE_PROVIDER_SITE_OTHER): Payer: Self-pay | Admitting: Family Medicine

## 2021-01-08 DIAGNOSIS — E559 Vitamin D deficiency, unspecified: Secondary | ICD-10-CM | POA: Diagnosis not present

## 2021-01-08 DIAGNOSIS — Z6839 Body mass index (BMI) 39.0-39.9, adult: Secondary | ICD-10-CM

## 2021-01-08 DIAGNOSIS — F3289 Other specified depressive episodes: Secondary | ICD-10-CM

## 2021-01-08 DIAGNOSIS — E1169 Type 2 diabetes mellitus with other specified complication: Secondary | ICD-10-CM | POA: Diagnosis not present

## 2021-01-08 MED ORDER — BUPROPION HCL ER (SR) 100 MG PO TB12: 100.0000 mg | ORAL_TABLET | Freq: Every day | ORAL | 0 refills | Status: DC

## 2021-01-08 MED ORDER — METFORMIN HCL 500 MG PO TABS: ORAL_TABLET | ORAL | 0 refills | Status: DC

## 2021-01-08 NOTE — Progress Notes (Signed)
TeleHealth Visit:  Due to the COVID-19 pandemic, this visit was completed with telemedicine (audio/video) technology to reduce patient and provider exposure as well as to preserve personal protective equipment.   Tony [name] has verbally consented to this TeleHealth visit. The patient is located at home, the provider is located at the [facility] office. The participants in this visit include the listed provider and patient. The visit was conducted today via video.   Chief Complaint: OBESITY Tony [name] is here to discuss his progress with his obesity treatment plan along with follow-up of his obesity related diagnoses. Tony [name] is on keeping a food journal and adhering to recommended goals of 1400-1700 calories and 115+ grams protein and states he is following his eating plan approximately 50% of the time. 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  Today's visit was #: 15 Starting weight: 270 lbs Starting date: 04/13/2020  Interim History: Tony [name] has had a rough last 2 weeks. He hasn't been as compliant on plan. He recently found out his parents dog has cancer and had surgery with quite a few complications. He has also experienced a back strain. Pt hosting Thanksgiving and then going to [employer].  Subjective:   1. Other depression Tony [name] reports a significant increase in feelings of jitteriness and anxiety. Pt denies suicidal or homicidal ideations.  2. Vitamin D deficiency Pt denies nausea, vomiting, and muscle weakness but notes fatigue. He is on prescription Vit D. His last Vit D level was 47.1.  3. Type 2 diabetes mellitus with other specified complication, without long-term current use of insulin (HCC) Tony [name] is not experiencing GI side effects of Metformin. His last A1c was 5.5 and insulin level 14.0.  Assessment/Plan:   1. Other depression Behavior modification techniques were discussed today to help Tony [name] deal  with his emotional/non-hunger eating behaviors.  Orders and follow up as documented in patient record.   Refill- buPROPion ER (WELLBUTRIN SR) 100 MG 12 hr tablet; Take 1 tablet (100 mg total) by mouth daily.  Dispense: 30 tablet; Refill: 0  2. Vitamin D deficiency Low Vitamin D level contributes to fatigue and are associated with obesity, breast, and colon cancer. He hold off on taking prescription Vitamin D and repeat labs with Dr. Anitra Lauth. He will follow-up for routine testing of Vitamin D, at least 2-3 times per year to avoid over-replacement.  3. Type 2 diabetes mellitus with other specified complication, without long-term current use of insulin (HCC) Good blood sugar control is important to decrease the likelihood of diabetic complications such as nephropathy, neuropathy, limb loss, blindness, coronary artery disease, and death. Intensive lifestyle modification including diet, exercise and weight loss are the first line of treatment for diabetes.   Refill- metFORMIN (GLUCOPHAGE) 500 MG tablet; 1/2 tab with breakfast 1/2 with lunch  Dispense: 30 tablet; Refill: 0  4. Obesity with current BMI of 37.2  Tony [name] is currently in the action stage of change. As such, his goal is to continue with weight loss efforts. He has agreed to keeping a food journal and adhering to recommended goals of 1400-1700 calories and 115+ grams protein.   Exercise goals: All adults should avoid inactivity. Some physical activity is better than none, and adults who participate in any amount of physical activity gain some health benefits.  Behavioral modification strategies: increasing lean protein intake, meal planning and cooking strategies, keeping healthy foods in the home, and planning for success.  Tony [name] has agreed to follow-up with our clinic in 3 weeks. He was informed of the importance of frequent follow-up visits to maximize his success with intensive lifestyle modifications for his multiple health  conditions.  Objective:   VITALS: Per patient if applicable, see vitals. GENERAL: Alert and in no acute distress. CARDIOPULMONARY: No increased WOB. Speaking in clear sentences.  PSYCH: Pleasant and cooperative. Speech normal rate and rhythm. Affect is appropriate. Insight and judgement are appropriate. Attention is focused, linear, and appropriate.  NEURO: Oriented as arrived to appointment on time with no prompting.   Lab Results  Component Value Date   CREATININE 0.99 08/21/2020   BUN 19 08/21/2020   NA 136 08/21/2020   K 4.6 08/21/2020   CL 103 08/21/2020   CO2 23 08/21/2020   Lab Results  Component Value Date   ALT 25 08/21/2020   AST 28 08/21/2020   ALKPHOS 64 08/21/2020   BILITOT 0.4 08/21/2020   Lab Results  Component Value Date   HGBA1C 5.5 07/10/2020   HGBA1C 5.9 03/22/2020   HGBA1C 6.2 07/02/2019   HGBA1C 5.8 11/13/2018   HGBA1C 5.6 06/24/2018   Lab Results  Component Value Date   INSULIN 14.0 07/10/2020   INSULIN 35.9 (H) 04/13/2020   Lab Results  Component Value Date   TSH 1.88 03/22/2020   Lab Results  Component Value Date   CHOL 163 08/21/2020   HDL 41.20 08/21/2020   LDLCALC 99 08/21/2020   LDLDIRECT 157.0 07/02/2019   TRIG 112.0 08/21/2020   CHOLHDL 4 08/21/2020   Lab Results  Component Value Date   VD25OH 47.1 07/10/2020   VD25OH 33.4 04/13/2020   Lab Results  Component Value Date   WBC 5.8 03/22/2020   HGB 15.5 03/22/2020   HCT 46.0 03/22/2020   MCV 93.4 03/22/2020   PLT 141.0 (L) 03/22/2020   No results found for: IRON, TIBC, FERRITIN  Attestation Statements:   Reviewed by clinician on day of visit: allergies, medications, problem list, medical history, surgical history, family history, social history, and previous encounter notes.  Coral Ceo, CMA, am acting as transcriptionist for Coralie Common, MD.   I have reviewed the above documentation for accuracy and completeness, and I agree with the above. - Coralie Common, MD

## 2021-01-13 ENCOUNTER — Other Ambulatory Visit (INDEPENDENT_AMBULATORY_CARE_PROVIDER_SITE_OTHER): Payer: Self-pay | Admitting: Family Medicine

## 2021-01-13 DIAGNOSIS — E559 Vitamin D deficiency, unspecified: Secondary | ICD-10-CM

## 2021-02-01 ENCOUNTER — Ambulatory Visit (INDEPENDENT_AMBULATORY_CARE_PROVIDER_SITE_OTHER): Payer: BC Managed Care – PPO | Admitting: Family Medicine

## 2021-02-01 ENCOUNTER — Encounter (INDEPENDENT_AMBULATORY_CARE_PROVIDER_SITE_OTHER): Payer: Self-pay

## 2021-02-01 NOTE — Progress Notes (Signed)
Tony [name] [address] [place] Phone: [phone] Subjective:   IVilma [name], am serving as a scribe for Dr. Hulan Saas. This visit occurred during the SARS-CoV-2 public health emergency.  Safety protocols were in place, including screening questions prior to the visit, additional usage of staff PPE, and extensive cleaning of exam room while observing appropriate contact time as indicated for disinfecting solutions.   I'm seeing this patient by the request  of:  McGowen, Adrian Blackwater, MD  CC: neck and back pain   FBP:ZWCHENIDPO  Tony [name] is a 45 y.o. male coming in with complaint of back and neck pain. OMT 12/05/2020. Patient states pain in usual location. Pain level is slowly getting back to where it was before. May need another epidural soon.  Medications patient has been prescribed: Gabapentin,   Taking:         Reviewed prior external information including notes and imaging from previsou exam, outside providers and external EMR if available.   As well as notes that were available from care everywhere and other healthcare systems.  Past medical history, social, surgical and family history all reviewed in electronic medical record.  No pertanent information unless stated regarding to the chief complaint.   Past Medical History:  Diagnosis Date   Alcohol abuse 2016/2017   Pt states he was self medicating his anxiety.  Quit 04/2015.  Minimal intake as of 05/2017 f/u.   Anal fissure    Anxiety and depression    Back pain    Borderline diabetes    Erectile dysfunction    cialis 63m qd-qod started by urol 09/2020   Hepatic steatosis 2015; 2016; 02/2016   +Alcohol has played a role.  CT abd showed fatty liver+ liver enzymes mildly elevated (chron viral hepatitis screening NEG).  Abd u/s 03/26/16 showed fatty liver, small GB polyps, o/w normal.   History of kidney stones    [facility]   HTN (hypertension)    Hx of  colonic polyp - ssp 02/11/2014   No polyps 02/2020->recall 10 yrs   Hyperlipidemia    Statin started 05/2018   Insomnia    Obesity, Class II, BMI 35-39.9    Psoriasis    Secondary male hypogonadism    clomiphene 06/2019: normalized on clomiphene 08/2019   Shortness of breath on exertion     No Known Allergies   Review of Systems:  No headache, visual changes, nausea, vomiting, diarrhea, constipation, dizziness, abdominal pain, skin rash, fevers, chills, night sweats, weight loss, swollen lymph nodes, body aches, joint swelling, chest pain, shortness of breath, mood changes. POSITIVE muscle aches  Objective  Blood pressure 116/78, pulse 80, height 5' 9"  (1.753 m), weight 255 lb (115.7 kg), SpO2 96 %.   General: No apparent distress alert and oriented x3 mood and affect normal, dressed appropriately.  HEENT: Pupils equal, extraocular movements intact  Respiratory: Patient's speak in full sentences and does not appear short of breath  Cardiovascular: No lower extremity edema, non tender, no erythema  Low back exam does have some loss of lordosis.  Worsening pain with straight leg test bilaterally.  No tightness noted of the lower extremity.  Tender to palpation of the paraspinal musculature right greater than left of the lumbar spine and the sacrum area.  Osteopathic findings  T9 extended rotated and side bent left L2 flexed rotated and side bent right Sacrum right on right       Assessment and Plan:  Degenerative disc disease, lumbar DDD patient does have acute changes noted.  Patient has responded to epidurals previously.  Having increasing discomfort again Will reschedule if necessary for another epidural.  Continue gabapentin and meloxicam for breakthrough.  Patient also has methocarbamol.  Responds well to osteopathic manipulation.  Follow-up again in 6 weeks   Nonallopathic problems  Decision today to treat with OMT was based on Physical Exam  After verbal consent patient  was treated with HVLA, ME, FPR techniques in cervical, rib, thoracic, lumbar, and sacral  areas  Patient tolerated the procedure well with improvement in symptoms  Patient given exercises, stretches and lifestyle modifications  See medications in patient instructions if given  Patient will follow up in 4-8 weeks      The above documentation has been reviewed and is accurate and complete Lyndal Pulley, DO        Note: This dictation was prepared with Dragon dictation along with smaller phrase technology. Any transcriptional errors that result from this process are unintentional.

## 2021-02-03 ENCOUNTER — Other Ambulatory Visit (INDEPENDENT_AMBULATORY_CARE_PROVIDER_SITE_OTHER): Payer: Self-pay | Admitting: Family Medicine

## 2021-02-03 DIAGNOSIS — F3289 Other specified depressive episodes: Secondary | ICD-10-CM

## 2021-02-06 ENCOUNTER — Other Ambulatory Visit: Payer: Self-pay

## 2021-02-06 ENCOUNTER — Encounter: Payer: Self-pay | Admitting: Family Medicine

## 2021-02-06 ENCOUNTER — Ambulatory Visit: Payer: BC Managed Care – PPO | Admitting: Family Medicine

## 2021-02-06 VITALS — BP 116/78 | HR 80 | Ht 69.0 in | Wt 255.0 lb

## 2021-02-06 DIAGNOSIS — M9902 Segmental and somatic dysfunction of thoracic region: Secondary | ICD-10-CM

## 2021-02-06 DIAGNOSIS — M9903 Segmental and somatic dysfunction of lumbar region: Secondary | ICD-10-CM

## 2021-02-06 DIAGNOSIS — M5136 Other intervertebral disc degeneration, lumbar region: Secondary | ICD-10-CM

## 2021-02-06 DIAGNOSIS — M9904 Segmental and somatic dysfunction of sacral region: Secondary | ICD-10-CM | POA: Diagnosis not present

## 2021-02-06 NOTE — Assessment & Plan Note (Signed)
DDD patient does have acute changes noted.  Patient has responded to epidurals previously.  Having increasing discomfort again Will reschedule if necessary for another epidural.  Continue gabapentin and meloxicam for breakthrough.  Patient also has methocarbamol.  Responds well to osteopathic manipulation.  Follow-up again in 6 weeks

## 2021-02-06 NOTE — Patient Instructions (Signed)
Good to see you! We put he order in You can use it when you need it Stay active See you again in 6-8 weeks

## 2021-02-13 ENCOUNTER — Encounter (INDEPENDENT_AMBULATORY_CARE_PROVIDER_SITE_OTHER): Payer: Self-pay | Admitting: Family Medicine

## 2021-02-13 ENCOUNTER — Other Ambulatory Visit: Payer: Self-pay

## 2021-02-13 ENCOUNTER — Ambulatory Visit (INDEPENDENT_AMBULATORY_CARE_PROVIDER_SITE_OTHER): Payer: BC Managed Care – PPO | Admitting: Family Medicine

## 2021-02-13 VITALS — BP 106/69 | HR 70 | Temp 98.3°F | Ht 69.0 in | Wt 249.0 lb

## 2021-02-13 DIAGNOSIS — Z9189 Other specified personal risk factors, not elsewhere classified: Secondary | ICD-10-CM

## 2021-02-13 DIAGNOSIS — Z6839 Body mass index (BMI) 39.0-39.9, adult: Secondary | ICD-10-CM

## 2021-02-13 DIAGNOSIS — E559 Vitamin D deficiency, unspecified: Secondary | ICD-10-CM | POA: Diagnosis not present

## 2021-02-13 DIAGNOSIS — F3289 Other specified depressive episodes: Secondary | ICD-10-CM | POA: Diagnosis not present

## 2021-02-13 NOTE — Progress Notes (Signed)
Chief Complaint:   OBESITY Tony [name] is here to discuss his progress with his obesity treatment plan along with follow-up of his obesity related diagnoses. Tony [name] is on keeping a food journal and adhering to recommended goals of 1400-1700 calories and 115+ grams of protein daily and states he is following his eating plan approximately 35-40% of the time. Tony [name] states he is doing 0 minutes 0 times per week.  Today's visit was #: 16 Starting weight: 270 lbs Starting date: 04/13/2020 Today's weight: 249 lbs Today's date: 02/13/2021 Total lbs lost to date: 21 Total lbs lost since last in-office visit: 2  Interim History: Tony [name] continues to do well with weight loss on his plan. He has avoided holiday weight gain and he is working on being mindful, but he hasn't done as much journaling or exercise.  Subjective:   1. Vitamin D deficiency Tony [name] is on Vit D prescription, and he is overdue for labs. He is at high risk of over-replacement. He will be getting labs done at his primary care physician's office next month.  2. Other depression with emotional eating Tony [name] started on Wellbutrin and he noted "it didn't make his chest feel good", and he felt it hindered his motivation. Dr. Jearld Shines decreased his dose with no change so he stopped it approximately 1 week ago.  3. At risk for heart disease Tony [name] is at a higher than average risk for cardiovascular disease due to obesity.   Assessment/Plan:   1. Vitamin D deficiency Tony [name] is to hold prescription Vitamin D, and we will recheck his Vitamin D level in 1 month (order was entered today). He will follow-up for routine testing of Vitamin D, at least 2-3 times per year to avoid over-replacement.  - VITAMIN D 25 Hydroxy (Vit-D Deficiency, Fractures)  2. Other depression with emotional eating Tony [name] agreed to discontinue Wellbutrin and he is to continue to work on decreasing emotional eating behaviors. Orders and follow up as documented in  patient record.   3. At risk for heart disease Tony [name] was given approximately 15 minutes of coronary artery disease prevention counseling today. He is 45 y.o. male and has risk factors for heart disease including obesity. We discussed intensive lifestyle modifications today with an emphasis on specific weight loss instructions and strategies.   Repetitive spaced learning was employed today to elicit superior memory formation and behavioral change.  4. Obesity BMI today is 74 Tony [name] is currently in the action stage of change. As such, his goal is to continue with weight loss efforts. He has agreed to keeping a food journal and adhering to recommended goals of 1400-1700 calories and 100+ grams of protein daily.   Behavioral modification strategies: increasing lean protein intake and holiday eating strategies .  Tony [name] has agreed to follow-up with our clinic in 4 weeks. He was informed of the importance of frequent follow-up visits to maximize his success with intensive lifestyle modifications for his multiple health conditions.   Objective:   Blood pressure 106/69, pulse 70, temperature 98.3 F (36.8 C), height 5' 9"  (1.753 m), weight 249 lb (112.9 kg), SpO2 97 %. Body mass index is 36.77 kg/m.  General: Cooperative, alert, well developed, in no acute distress. HEENT: Conjunctivae and lids unremarkable. Cardiovascular: Regular rhythm.  Lungs: Normal work of breathing. Neurologic: No focal deficits.   Lab Results  Component Value Date   CREATININE 0.99 08/21/2020   BUN 19 08/21/2020   NA 136 08/21/2020   K 4.6 08/21/2020   CL 103  08/21/2020   CO2 23 08/21/2020   Lab Results  Component Value Date   ALT 25 08/21/2020   AST 28 08/21/2020   ALKPHOS 64 08/21/2020   BILITOT 0.4 08/21/2020   Lab Results  Component Value Date   HGBA1C 5.5 07/10/2020   HGBA1C 5.9 03/22/2020   HGBA1C 6.2 07/02/2019   HGBA1C 5.8 11/13/2018   HGBA1C 5.6 06/24/2018   Lab Results  Component Value  Date   INSULIN 14.0 07/10/2020   INSULIN 35.9 (H) 04/13/2020   Lab Results  Component Value Date   TSH 1.88 03/22/2020   Lab Results  Component Value Date   CHOL 163 08/21/2020   HDL 41.20 08/21/2020   LDLCALC 99 08/21/2020   LDLDIRECT 157.0 07/02/2019   TRIG 112.0 08/21/2020   CHOLHDL 4 08/21/2020   Lab Results  Component Value Date   VD25OH 47.1 07/10/2020   VD25OH 33.4 04/13/2020   Lab Results  Component Value Date   WBC 5.8 03/22/2020   HGB 15.5 03/22/2020   HCT 46.0 03/22/2020   MCV 93.4 03/22/2020   PLT 141.0 (L) 03/22/2020   No results found for: IRON, TIBC, FERRITIN  Attestation Statements:   Reviewed by clinician on day of visit: allergies, medications, problem list, medical history, surgical history, family history, social history, and previous encounter notes.   I, Trixie Dredge, am acting as transcriptionist for Dennard Nip, MD.  I have reviewed the above documentation for accuracy and completeness, and I agree with the above. -  Dennard Nip, MD

## 2021-02-14 ENCOUNTER — Other Ambulatory Visit: Payer: Self-pay | Admitting: Family Medicine

## 2021-02-14 ENCOUNTER — Ambulatory Visit
Admission: RE | Admit: 2021-02-14 | Discharge: 2021-02-14 | Disposition: A | Discharge status: Home / Self Care | Payer: BC Managed Care – PPO | Source: Ambulatory Visit | Attending: Family Medicine | Admitting: Family Medicine

## 2021-02-14 DIAGNOSIS — M5136 Other intervertebral disc degeneration, lumbar region: Secondary | ICD-10-CM

## 2021-02-14 MED ORDER — METHYLPREDNISOLONE ACETATE 40 MG/ML INJ SUSP (RADIOLOG: 80.0000 mg | Freq: Once | INTRAMUSCULAR | Status: DC

## 2021-02-14 MED ORDER — IOPAMIDOL (ISOVUE-M 300) INJECTION 61%: 1.0000 mL | Freq: Once | INTRAMUSCULAR | Status: DC

## 2021-02-14 NOTE — Discharge Instructions (Signed)

## 2021-03-05 ENCOUNTER — Other Ambulatory Visit: Payer: Self-pay

## 2021-03-05 ENCOUNTER — Ambulatory Visit
Admission: RE | Admit: 2021-03-05 | Discharge: 2021-03-05 | Disposition: A | Discharge status: Home / Self Care | Payer: BC Managed Care – PPO | Source: Ambulatory Visit | Attending: Family Medicine | Admitting: Family Medicine

## 2021-03-05 DIAGNOSIS — M47816 Spondylosis without myelopathy or radiculopathy, lumbar region: Secondary | ICD-10-CM | POA: Diagnosis not present

## 2021-03-05 DIAGNOSIS — M5136 Other intervertebral disc degeneration, lumbar region: Secondary | ICD-10-CM

## 2021-03-05 MED ORDER — METHYLPREDNISOLONE ACETATE 40 MG/ML INJ SUSP (RADIOLOG
80.0000 mg | Freq: Once | INTRAMUSCULAR | Status: AC
  Administered 2021-03-05: 80 mg via EPIDURAL

## 2021-03-05 MED ORDER — IOPAMIDOL (ISOVUE-M 200) INJECTION 41%
1.0000 mL | Freq: Once | INTRAMUSCULAR | Status: AC
  Administered 2021-03-05: 1 mL via EPIDURAL

## 2021-03-05 NOTE — Discharge Instructions (Signed)

## 2021-03-06 ENCOUNTER — Other Ambulatory Visit: Payer: Self-pay | Admitting: Family Medicine

## 2021-03-06 NOTE — Telephone Encounter (Signed)
Pt said pharmacy has sent request 3 times Pt needing refill on    clonazePAM clonazePAM (KLONOPIN) 1 MG tablet  [name] [id number] - [place], [place] Phone:  [phone]  Fax:  [phone]

## 2021-03-06 NOTE — Telephone Encounter (Signed)
Requesting: clonazepam  Contract: 08/21/20 UDS: 08/21/20 Last Visit: 08/21/20 Next Visit: 03/26/21 Last Refill: 09/11/20(180,1)  Please Advise. Medication pending

## 2021-03-07 ENCOUNTER — Other Ambulatory Visit (INDEPENDENT_AMBULATORY_CARE_PROVIDER_SITE_OTHER): Payer: Self-pay | Admitting: Family Medicine

## 2021-03-07 DIAGNOSIS — E1169 Type 2 diabetes mellitus with other specified complication: Secondary | ICD-10-CM

## 2021-03-07 MED ORDER — CLONAZEPAM 1 MG PO TABS: 1.0000 mg | ORAL_TABLET | Freq: Three times a day (TID) | ORAL | 1 refills | Status: DC | PRN

## 2021-03-07 NOTE — Telephone Encounter (Signed)
Pt advised refill sent.

## 2021-03-07 NOTE — Telephone Encounter (Signed)
LAST APPOINTMENT DATE: 02/13/21 NEXT APPOINTMENT DATE: 04/02/21   [facility] [id number] Tony [name], [name] [address] Phone: [phone] Fax: [phone]  Patient is requesting a refill of the following medications: Requested Prescriptions   Pending Prescriptions Disp Refills   metFORMIN (GLUCOPHAGE) 500 MG tablet [Pharmacy Med Name: metFORMIN HCL 500 MG TABLET] 30 tablet 0    Sig: TAKE 1/2 TABLET BY MOUTH WITH BREAKFAST AND 1/2 TABLET BY MOUTH WITH LUNCH    Date last filled: 01/08/21 Previously prescribed by Dr. Jearld Shines  Lab Results  Component Value Date   HGBA1C 5.5 07/10/2020   HGBA1C 5.9 03/22/2020   HGBA1C 6.2 07/02/2019   Lab Results  Component Value Date   MICROALBUR <0.7 07/02/2019   [place] 99 08/21/2020   CREATININE 0.99 08/21/2020   Lab Results  Component Value Date   VD25OH 47.1 07/10/2020   VD25OH 33.4 04/13/2020    BP Readings from Last 3 Encounters:  03/05/21 129/88  02/13/21 106/69  02/06/21 116/78

## 2021-03-08 ENCOUNTER — Other Ambulatory Visit: Payer: Self-pay | Admitting: Family Medicine

## 2021-03-08 NOTE — Telephone Encounter (Signed)
Pt called about refill  Told pt refill sent yesterday.  Pt called to get status of refill, pt went up to pharmacy. Pharmacist mentioned that it hasn't been sent in yet. Pt is confused about back & fourth information --KR  Pt cell: [phone]

## 2021-03-09 DIAGNOSIS — L218 Other seborrheic dermatitis: Secondary | ICD-10-CM | POA: Diagnosis not present

## 2021-03-09 DIAGNOSIS — B360 Pityriasis versicolor: Secondary | ICD-10-CM | POA: Diagnosis not present

## 2021-03-09 DIAGNOSIS — D1801 Hemangioma of skin and subcutaneous tissue: Secondary | ICD-10-CM | POA: Diagnosis not present

## 2021-03-09 NOTE — Telephone Encounter (Signed)
Requesting: eszopiclone Contract: 08/21/20 UDS: 08/21/20 Last Visit: 08/21/20 Next Visit:03/26/21 Last Refill:  09/12/20(90,1)  RF request for rosuvastatin LOV: 08/21/20 Next ov:  08/21/20 Last written: 09/20/20(90,1)   Please Advise. Meds pending

## 2021-03-12 NOTE — Telephone Encounter (Signed)
Pt advised refills sent.

## 2021-03-26 ENCOUNTER — Encounter: Payer: BC Managed Care – PPO | Admitting: Family Medicine

## 2021-04-02 ENCOUNTER — Ambulatory Visit (INDEPENDENT_AMBULATORY_CARE_PROVIDER_SITE_OTHER): Payer: BC Managed Care – PPO | Admitting: Family Medicine

## 2021-04-04 ENCOUNTER — Other Ambulatory Visit (INDEPENDENT_AMBULATORY_CARE_PROVIDER_SITE_OTHER): Payer: Self-pay | Admitting: Family Medicine

## 2021-04-04 DIAGNOSIS — E1169 Type 2 diabetes mellitus with other specified complication: Secondary | ICD-10-CM

## 2021-04-04 MED ORDER — METFORMIN HCL 500 MG PO TABS: ORAL_TABLET | ORAL | 0 refills | Status: DC

## 2021-04-04 NOTE — Telephone Encounter (Signed)
Dr.Beasley 

## 2021-04-04 NOTE — Telephone Encounter (Signed)
LAST APPOINTMENT DATE: 02/13/21 NEXT APPOINTMENT DATE: 04/30/21   [facility] [id number] [address], [place] [address] Phone: [phone] Fax: [phone]  Patient is requesting a refill of the following medications: Requested Prescriptions   Pending Prescriptions Disp Refills   metFORMIN (GLUCOPHAGE) 500 MG tablet 30 tablet 0    Sig: TAKE 1/2 TABLET BY MOUTH WITH BREAKFAST AND 1/2 TABLET BY MOUTH WITH LUNCH    Date last filled: 03/07/21 Previously prescribed by Dr. Leafy Ro  Lab Results  Component Value Date   HGBA1C 5.5 07/10/2020   HGBA1C 5.9 03/22/2020   HGBA1C 6.2 07/02/2019   Lab Results  Component Value Date   MICROALBUR <0.7 07/02/2019   [place] 99 08/21/2020   CREATININE 0.99 08/21/2020   Lab Results  Component Value Date   VD25OH 47.1 07/10/2020   VD25OH 33.4 04/13/2020    BP Readings from Last 3 Encounters:  03/05/21 129/88  02/13/21 106/69  02/06/21 116/78

## 2021-04-05 ENCOUNTER — Ambulatory Visit: Payer: BC Managed Care – PPO | Admitting: Family Medicine

## 2021-04-20 ENCOUNTER — Other Ambulatory Visit: Payer: Self-pay

## 2021-04-23 ENCOUNTER — Other Ambulatory Visit: Payer: Self-pay

## 2021-04-23 ENCOUNTER — Encounter: Payer: Self-pay | Admitting: Family Medicine

## 2021-04-23 ENCOUNTER — Ambulatory Visit (INDEPENDENT_AMBULATORY_CARE_PROVIDER_SITE_OTHER): Payer: BC Managed Care – PPO | Admitting: Family Medicine

## 2021-04-23 VITALS — BP 113/76 | HR 63 | Temp 97.7°F | Ht 70.0 in | Wt 264.6 lb

## 2021-04-23 DIAGNOSIS — F411 Generalized anxiety disorder: Secondary | ICD-10-CM

## 2021-04-23 DIAGNOSIS — I10 Essential (primary) hypertension: Secondary | ICD-10-CM | POA: Diagnosis not present

## 2021-04-23 DIAGNOSIS — E559 Vitamin D deficiency, unspecified: Secondary | ICD-10-CM | POA: Diagnosis not present

## 2021-04-23 DIAGNOSIS — E1159 Type 2 diabetes mellitus with other circulatory complications: Secondary | ICD-10-CM

## 2021-04-23 DIAGNOSIS — F339 Major depressive disorder, recurrent, unspecified: Secondary | ICD-10-CM

## 2021-04-23 DIAGNOSIS — R7303 Prediabetes: Secondary | ICD-10-CM | POA: Diagnosis not present

## 2021-04-23 DIAGNOSIS — E7800 Pure hypercholesterolemia, unspecified: Secondary | ICD-10-CM

## 2021-04-23 DIAGNOSIS — K219 Gastro-esophageal reflux disease without esophagitis: Secondary | ICD-10-CM | POA: Diagnosis not present

## 2021-04-23 DIAGNOSIS — F5101 Primary insomnia: Secondary | ICD-10-CM

## 2021-04-23 LAB — COMPREHENSIVE METABOLIC PANEL
ALT: 36 U/L (ref 0–53)
AST: 28 U/L (ref 0–37)
Albumin: 4.5 g/dL (ref 3.5–5.2)
Alkaline Phosphatase: 70 U/L (ref 39–117)
BUN: 19 mg/dL (ref 6–23)
CO2: 27 mEq/L (ref 19–32)
Calcium: 9 mg/dL (ref 8.4–10.5)
Chloride: 103 mEq/L (ref 96–112)
Creatinine, Ser: 1.07 mg/dL (ref 0.40–1.50)
GFR: 84.27 mL/min (ref >60.00)
Glucose, Bld: 90 mg/dL (ref 70–99)
Potassium: 3.8 mEq/L (ref 3.5–5.1)
Sodium: 137 mEq/L (ref 135–145)
Total Bilirubin: 0.4 mg/dL (ref 0.2–1.2)
Total Protein: 7 g/dL (ref 6.0–8.3)

## 2021-04-23 LAB — CBC WITH DIFFERENTIAL/PLATELET
Basophils Absolute: 0 10*3/uL (ref 0.0–0.1)
Basophils Relative: 1 % (ref 0.0–3.0)
Eosinophils Absolute: 0.3 10*3/uL (ref 0.0–0.7)
Eosinophils Relative: 6.7 % — ABNORMAL HIGH (ref 0.0–5.0)
HCT: 42.7 % (ref 39.0–52.0)
Hemoglobin: 14.6 g/dL (ref 13.0–17.0)
Lymphocytes Relative: 43.7 % (ref 12.0–46.0)
Lymphs Abs: 2.1 10*3/uL (ref 0.7–4.0)
MCHC: 34.1 g/dL (ref 30.0–36.0)
MCV: 94.4 fl (ref 78.0–100.0)
Monocytes Absolute: 0.5 10*3/uL (ref 0.1–1.0)
Monocytes Relative: 9.9 % (ref 3.0–12.0)
Neutro Abs: 1.9 10*3/uL (ref 1.4–7.7)
Neutrophils Relative %: 38.7 % — ABNORMAL LOW (ref 43.0–77.0)
Platelets: 143 10*3/uL — ABNORMAL LOW (ref 150.0–400.0)
RBC: 4.52 Mil/uL (ref 4.22–5.81)
RDW: 12.2 % (ref 11.5–15.5)
WBC: 4.8 10*3/uL (ref 4.0–10.5)

## 2021-04-23 LAB — LIPID PANEL
Cholesterol: 171 mg/dL (ref 0–200)
HDL: 39.3 mg/dL (ref >39.00)
LDL Cholesterol: 95 mg/dL (ref 0–99)
NonHDL: 132.17
Total CHOL/HDL Ratio: 4
Triglycerides: 188 mg/dL — ABNORMAL HIGH (ref 0.0–149.0)
VLDL: 37.6 mg/dL (ref 0.0–40.0)

## 2021-04-23 LAB — HEMOGLOBIN A1C: Hgb A1c MFr Bld: 5.4 % (ref 4.6–6.5)

## 2021-04-23 MED ORDER — MELOXICAM 15 MG PO TABS: 15.0000 mg | ORAL_TABLET | Freq: Every day | ORAL | 3 refills | Status: DC | PRN

## 2021-04-23 MED ORDER — OMEPRAZOLE 40 MG PO CPDR: 40.0000 mg | DELAYED_RELEASE_CAPSULE | Freq: Every day | ORAL | 1 refills | Status: DC

## 2021-04-23 MED ORDER — CLONAZEPAM 1 MG PO TABS
1.0000 mg | ORAL_TABLET | Freq: Three times a day (TID) | ORAL | 1 refills | Status: DC | PRN
Start: 2021-04-23 — End: 2021-11-07

## 2021-04-23 MED ORDER — METHOCARBAMOL 750 MG PO TABS: 750.0000 mg | ORAL_TABLET | Freq: Four times a day (QID) | ORAL | 3 refills | Status: DC

## 2021-04-23 NOTE — Progress Notes (Signed)
Office Note 04/23/2021  CC:  Chief Complaint  Patient presents with   Annual Exam    Pt is fasting    HPI:  Patient is a 46 y.o. male who is here for 8 mo f/u HTN, HLD, insomnia, anx and dep, and insulin resistance. A/P as of last visit: 1) HTN, bp too low on 1m lisin-->cut back to 568mqd. Lytes/cr today.   2) HLD, mixed: tolerating rosuva 59m24md. FLP and hepatic panel today.   3) Insomnia, managed pretty well on lunesta 3mg26ms.   4) GAD, rec MDD--in remission. Cont 1/2 sertraline 259mg40mand clonaz 1mg t46m CSC [employer] today. CSC today.   5) Borderline DM: last a1c great 6 wks ago --done by Bariatric clinic provider. She also started him on 250mg m26mrmin bid recently.  INTERIM HX: He is now off antihypertensive medication and blood pressures are normal consistently in his clinic visits.  Continues to get some benefit from clonazepam 1 mg 3 times daily. His sertraline and carbamazepine were managed by his psychiatrist, Dr. Karen JPauline Goodescribes a few episodes of severe reflux to the point of regurgitation and feeling like he could not swallow anymore.  The severe symptoms lasted about 10 minutes and then the reflux discomfort gradually resolved over the next hour or 2.  He takes no reflux medicine.  ROS as above, plus--> no fevers, no CP, no SOB, no wheezing, no cough, no dizziness, no HAs, no rashes, no melena/hematochezia.  No polyuria or polydipsia.  No myalgias or arthralgias.  No focal weakness, paresthesias, or tremors.  No acute vision or hearing abnormalities.  No dysuria or unusual/new urinary urgency or frequency.  No recent changes in lower legs. No n/v/d or abd pain.  No palpitations.      PMP AWARE reviewed today: most recent rx for lunesta was filled 04/06/21, # 90, rx 87 me. Most recent rx for clonazepam was filled 03/08/21, #60, RX by me. No red flags.  Past Medical History:  Diagnosis Date   Alcohol abuse 2016/2017   Pt states he was self  medicating his anxiety.  Quit 04/2015.  Minimal intake as of 05/2017 f/u.   Anal fissure    Anxiety and depression    Back pain    Borderline diabetes    Erectile dysfunction    cialis 59mg qd-76m started by urol 09/2020   Hepatic steatosis 2015; 2016; 02/2016   +Alcohol has played a role.  CT abd showed fatty liver+ liver enzymes mildly elevated (chron viral hepatitis screening NEG).  Abd u/s 03/26/16 showed fatty liver, small GB polyps, o/w normal.   History of kidney stones    [facility]   HTN (hypertension)    Hx of colonic polyp - ssp 02/11/2014   No polyps 02/2020->recall 10 yrs   Hyperlipidemia    Statin started 05/2018   Insomnia    Obesity, Class II, BMI 35-39.9    Psoriasis    Secondary male hypogonadism    clomiphene 06/2019: normalized on clomiphene 08/2019   Shortness of breath on exertion     Past Surgical History:  Procedure Laterality Date   APPENDECTOMY     COLONOSCOPY  03/10/2020   No polyps. Recall 2032.   COLONOSCOPY W/ BIOPSIES  02/03/14   Cecal polyp (sessile serrated polyp w/out dysplasia) and posterior anal fissure; repeat TCS 2020 per Dr. Gessner Carlean PurlSURGERY Left 2001   broke left pinkie finger   LAPAROSCOPIC APPENDECTOMY N/A 02/24/2015  Procedure: APPENDECTOMY LAPAROSCOPIC;  Surgeon: Alphonsa Overall, MD;  Location: [facility];  Service: General;  Laterality: N/A;   right ankle     2014    Family History  Problem Relation Age of Onset   Diabetes Mother    Restless legs syndrome Mother    Heart disease Mother    Heart attack Mother 68   Hypertension Mother    Hyperlipidemia Mother    Depression Mother    Anxiety disorder Mother    Sleep apnea Mother    Psoriasis Father    Sleep apnea Father    Stroke Neg Hx    Cancer Neg Hx    Colon cancer Neg Hx    Colon polyps Neg Hx    Esophageal cancer Neg Hx    Rectal cancer Neg Hx    Stomach cancer Neg Hx     Social History   Socioeconomic History   Marital status: Married    Spouse name: Roselyn Reef    Number of children: Not on file   Years of education: Not on file   Highest education level: Not on file  Occupational History   Occupation: Secondary school teacher  Tobacco Use   Smoking status: Never   Smokeless tobacco: Never  Vaping Use   Vaping Use: Never used  Substance and Sexual Activity   Alcohol use: Not Currently    Alcohol/week: 0.0 standard drinks    Comment: 2019 stopped alcohol   Drug use: No   Sexual activity: Yes    Partners: Female    Birth control/protection: None  Other Topics Concern   Not on file  Social History Narrative   Married (spring 2019), no children.   Orig from [place].   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   No tobacco.   Alcohol: hx of "heavy drinking".  Cut back as of 04/2015.   Social Determinants of Health   Financial Resource Strain: Not on file  Food Insecurity: Not on file  Transportation Needs: Not on file  Physical Activity: Not on file  Stress: Not on file  Social Connections: Not on file  Intimate Partner Violence: Not on file    Outpatient Medications Prior to Visit  Medication Sig Dispense Refill   carbamazepine (TEGRETOL) 100 MG chewable tablet TAKE 1 TABLET BY MOUTH IN THE MORNING THEN TAKE 2 TABLETS BY MOUTH AT BEDTIME     Eszopiclone 3 MG TABS TAKE ONE TABLET BY MOUTH EVERY NIGHT IMMEDIATELY BEFORE BEDTIME 90 tablet 1   fluticasone (FLONASE) 50 MCG/ACT nasal spray Place 2 sprays into both nostrils daily. 16 g 0   gabapentin (NEURONTIN) 100 MG capsule TAKE 2 CAPSULES BY MOUTH EVERY NIGHT AT BEDTIME 180 capsule 3   metFORMIN (GLUCOPHAGE) 500 MG tablet TAKE 1/2 TABLET BY MOUTH WITH BREAKFAST AND 1/2 TABLET BY MOUTH WITH LUNCH 30 tablet 0   propranolol (INDERAL) 10 MG tablet Take 10 mg by mouth 2 (two) times daily.     rosuvastatin (CRESTOR) 5 MG tablet TAKE ONE TABLET BY MOUTH DAILY 90 tablet 3   sertraline (ZOLOFT) 25 MG tablet Take 12.5 mg by mouth daily.     clonazePAM (KLONOPIN) 1 MG tablet Take 1 tablet (1 mg total) by mouth 3 (three)  times daily as needed for anxiety. 180 tablet 1   fluticasone (CUTIVATE) 0.05 % cream Apply to affected areas bid prn 30 g 1   meloxicam (MOBIC) 15 MG tablet Take 15 mg by mouth daily as needed for pain.     methocarbamol (ROBAXIN-750) 750  MG tablet Take 1 tablet (750 mg total) by mouth 4 (four) times daily. 30 tablet 0   polyethylene glycol (MIRALAX / GLYCOLAX) 17 g packet Take 17 g by mouth daily as needed. 30 each 0   Vitamin D, Ergocalciferol, (DRISDOL) 1.25 MG (50000 UNIT) CAPS capsule TAKE ONE CAPSULE BY MOUTH EVERY 7 DAYS (Patient not taking: Reported on 04/23/2021) 4 capsule 0   albuterol (VENTOLIN HFA) 108 (90 Base) MCG/ACT inhaler Inhale 2 puffs into the lungs every 6 (six) hours as needed for wheezing or shortness of breath. (Patient not taking: Reported on 04/23/2021) 8 g 0   No facility-administered medications prior to visit.    No Known Allergies   PE; Vitals with BMI 04/23/2021 03/05/2021 02/13/2021  Height 5' 10"  - 5' 9"   Weight 264 lbs 10 oz - 249 lbs  BMI 25.85 - 27.78  Systolic 242 353 614  Diastolic 76 88 69  Pulse 63 72 70  Some encounter information is confidential and restricted. Go to Review Flowsheets activity to see all data.   Gen: Alert, well appearing.  Patient is oriented to person, place, time, and situation. AFFECT: pleasant, lucid thought and speech. CV: RRR, no m/r/g.   LUNGS: CTA bilat, nonlabored resps, good aeration in all lung fields. EXT: no clubbing or cyanosis.  no edema.   Pertinent labs:  Lab Results  Component Value Date   TSH 1.88 03/22/2020   Lab Results  Component Value Date   WBC 5.8 03/22/2020   HGB 15.5 03/22/2020   HCT 46.0 03/22/2020   MCV 93.4 03/22/2020   PLT 141.0 (L) 03/22/2020   Lab Results  Component Value Date   CREATININE 0.99 08/21/2020   BUN 19 08/21/2020   NA 136 08/21/2020   K 4.6 08/21/2020   CL 103 08/21/2020   CO2 23 08/21/2020   Lab Results  Component Value Date   ALT 25 08/21/2020   AST 28  08/21/2020   ALKPHOS 64 08/21/2020   BILITOT 0.4 08/21/2020   Lab Results  Component Value Date   CHOL 163 08/21/2020   Lab Results  Component Value Date   HDL 41.20 08/21/2020   Lab Results  Component Value Date   LDLCALC 99 08/21/2020   Lab Results  Component Value Date   TRIG 112.0 08/21/2020   Lab Results  Component Value Date   CHOLHDL 4 08/21/2020   Lab Results  Component Value Date   HGBA1C 5.5 07/10/2020   ASSESSMENT AND PLAN:   #1 GERD: He will try to avoid the foods that provokes this, but he eats plenty of [place] which is the main meat he has to resort to to try to limit fat. Start omeprazole 40 mg a day.  #2 hypertension.  Since losing weight he has been able to get off antihypertensive.  #3 insulin resistance/borderline diabetes: His A1c peaked back in 2017 at 6.7%. Working on [employer], followed by healthy weight and wellness clinic. He is on one half of 500 mg metformin twice a day. Hemoglobin E3X, lipids, metabolic panel, thyroid, and urine microalbumin/creatinine.  #4 hyperlipidemia. Rosuvastatin 5 mg a day. Lipid panel and hepatic panel today.  #5 anxiety and anxiety related insomnia. Lunesta 3 mg nightly works sometimes and sometimes not--she takes this a few times a week. We will continue this. His sertraline and carbamazepine are managed by his psychiatrist. I do prescribe him clonazepam 1 mg 3 times daily and we will continue this. Clonazepam 1 mg, 1 3 times daily  as needed, #270, refill x1. Urine drug screen and controlled substance contract are up-to-date.  An After Visit Summary was printed and given to the patient.  FOLLOW UP:  Return in about 4 weeks (around 05/21/2021) for f/u reflux/med. Cpe 6 mo  Signed:  Crissie Sickles, MD           04/23/2021

## 2021-04-24 LAB — VITAMIN D 25 HYDROXY (VIT D DEFICIENCY, FRACTURES): VITD: 31.57 ng/mL (ref 30.00–100.00)

## 2021-04-24 LAB — MICROALBUMIN / CREATININE URINE RATIO
Creatinine,U: 52.6 mg/dL
Microalb Creat Ratio: 1.3 mg/g (ref 0.0–30.0)
Microalb, Ur: <0.7 mg/dL (ref 0.0–1.9)

## 2021-04-24 LAB — TSH: TSH: 3.38 u[IU]/mL (ref 0.35–5.50)

## 2021-04-25 NOTE — Progress Notes (Signed)
?Charlann Boxer D.O. ?[facility] ?[place] ?Phone: [phone] ?Subjective:   ? ?I'm seeing this patient by the request  of:  McGowen, Adrian Blackwater, MD ? ?CC: Neck and back pain follow-up ? ?[id number]  ?Tony [name] is a 46 y.o. male coming in with complaint of back and neck pain. OMT on 02/06/2021. Patient states played golf yesterday and tweaked back. The last round of epi's no go, the first time around went much better. Right shoulder pain when doing military press clicks, but not pain just sore. No other complaints.  Patient states that the pain can be bad but not truly stopping him from anything at this time. ? ?Medications patient has been prescribed: None ? ? ? ?  ? ? ? ? ?Reviewed prior external information including notes and imaging from previsou exam, outside providers and external EMR if available.  ? ?As well as notes that were available from care everywhere and other healthcare systems. ? ?Past medical history, social, surgical and family history all reviewed in electronic medical record.  No pertanent information unless stated regarding to the chief complaint.  ? ?Past Medical History:  ?Diagnosis Date  ? Alcohol abuse 2016/2017  ? Pt states he was self medicating his anxiety.  Quit 04/2015.  Minimal intake as of 05/2017 f/u.  ? Anal fissure   ? Anxiety and depression   ? Back pain   ? Borderline diabetes   ? Erectile dysfunction   ? cialis 21m qd-qod started by urol 09/2020  ? Hepatic steatosis 2015; 2016; 02/2016  ? +Alcohol has played a role.  CT abd showed fatty liver+ liver enzymes mildly elevated (chron viral hepatitis screening NEG).  Abd u/s 03/26/16 showed fatty liver, small GB polyps, o/w normal.  ? History of kidney stones   ? [facility]  ? HTN (hypertension)   ? Hx of colonic polyp - ssp 02/11/2014  ? No polyps 02/2020->recall 10 yrs  ? Hyperlipidemia   ? Statin started 05/2018  ? Insomnia   ? Obesity, Class II, BMI 35-39.9   ? Psoriasis   ?  Secondary male hypogonadism   ? clomiphene 06/2019: normalized on clomiphene 08/2019  ? Shortness of breath on exertion   ?  ?No Known Allergies ? ? ?Review of Systems: ? No headache, visual changes, nausea, vomiting, diarrhea, constipation, dizziness, abdominal pain, skin rash, fevers, chills, night sweats, weight loss, swollen lymph nodes, joint swelling, chest pain, shortness of breath, mood changes. POSITIVE muscle aches, body aches ? ?Objective  ?Blood pressure 124/72, pulse 81, height 5' 10"  (1.778 m), weight 263 lb (119.3 kg), SpO2 97 %. ?  ?General: No apparent distress alert and oriented x3 mood and affect normal, dressed appropriately.  ?HEENT: Pupils equal, extraocular movements intact  ?Respiratory: Patient's speak in full sentences and does not appear short of breath  ?Cardiovascular: No lower extremity edema, non tender, no erythema  ?Neuro: Cranial nerves II through XII are intact, neurovascularly intact in all extremities with 2+ DTRs and 2+ pulses.  ?Gait normal with good balance and coordination.  ?Neck exam does have some loss of lordosis.  Some tenderness to palpation of the paraspinal musculature.  Patient's low back does have poor core strength again noted.  Does have significant tightness of the hip flexors.  Tightness of the hamstrings also noted.  Tightness of the FABER test. ? ?Osteopathic findings ? ?C5 flexed rotated and side bent left ?T3 extended rotated and side bent right inhaled rib ?  T9 extended rotated and side bent left ?L2 flexed rotated and side bent right ?Sacrum right on right ? ? ? ? ?  ?Assessment and Plan: ? ?No problem-specific Assessment & Plan notes found for this encounter. ?  ? ?Nonallopathic problems ? ?Decision today to treat with OMT was based on Physical Exam ? ?After verbal consent patient was treated with HVLA, ME, FPR techniques in cervical, rib, thoracic, lumbar, and sacral  areas ? ?Patient tolerated the procedure well with improvement in symptoms ? ?Patient  given exercises, stretches and lifestyle modifications ? ?See medications in patient instructions if given ? ?Patient will follow up in 4-8 weeks ? ?  ? ? ?The above documentation has been reviewed and is accurate and complete Lyndal Pulley, DO  ? ? ?  ? ? Note: This dictation was prepared with Dragon dictation along with smaller phrase technology. Any transcriptional errors that result from this process are unintentional.    ?  ?  ? ?

## 2021-04-26 ENCOUNTER — Ambulatory Visit: Payer: BC Managed Care – PPO | Admitting: Family Medicine

## 2021-04-26 ENCOUNTER — Other Ambulatory Visit: Payer: Self-pay

## 2021-04-26 VITALS — BP 124/72 | HR 81 | Ht 70.0 in | Wt 263.0 lb

## 2021-04-26 DIAGNOSIS — M9903 Segmental and somatic dysfunction of lumbar region: Secondary | ICD-10-CM

## 2021-04-26 DIAGNOSIS — M9902 Segmental and somatic dysfunction of thoracic region: Secondary | ICD-10-CM | POA: Diagnosis not present

## 2021-04-26 DIAGNOSIS — M9908 Segmental and somatic dysfunction of rib cage: Secondary | ICD-10-CM | POA: Diagnosis not present

## 2021-04-26 DIAGNOSIS — M9904 Segmental and somatic dysfunction of sacral region: Secondary | ICD-10-CM | POA: Diagnosis not present

## 2021-04-26 DIAGNOSIS — M9901 Segmental and somatic dysfunction of cervical region: Secondary | ICD-10-CM

## 2021-04-26 DIAGNOSIS — M5136 Other intervertebral disc degeneration, lumbar region: Secondary | ICD-10-CM

## 2021-04-26 NOTE — Assessment & Plan Note (Signed)
Chronic problem with exacerbation.  Has responded to osteopathic manipulation and epidurals previously.  Patient does have different medications for breakthrough including the Robaxin and the meloxicam.  Patient has been taking the meloxicam on a regular basis.  Intermittently takes the methocarbamol.  Discussed with patient about core strengthening and weight loss.  Follow-up again in 2 months ?

## 2021-04-26 NOTE — Patient Instructions (Addendum)
Good to see you! ?Try the new exercise I showed you ?Try the muscle relaxer every night for next week ?See you again in 5-6 weeks ?

## 2021-04-30 ENCOUNTER — Other Ambulatory Visit: Payer: Self-pay

## 2021-04-30 ENCOUNTER — Ambulatory Visit (INDEPENDENT_AMBULATORY_CARE_PROVIDER_SITE_OTHER): Payer: BC Managed Care – PPO | Admitting: Family Medicine

## 2021-04-30 ENCOUNTER — Encounter (INDEPENDENT_AMBULATORY_CARE_PROVIDER_SITE_OTHER): Payer: Self-pay | Admitting: Family Medicine

## 2021-04-30 ENCOUNTER — Other Ambulatory Visit (INDEPENDENT_AMBULATORY_CARE_PROVIDER_SITE_OTHER): Payer: Self-pay | Admitting: Family Medicine

## 2021-04-30 VITALS — BP 115/76 | HR 65 | Temp 98.0°F | Ht 69.0 in | Wt 256.0 lb

## 2021-04-30 DIAGNOSIS — Z6839 Body mass index (BMI) 39.0-39.9, adult: Secondary | ICD-10-CM

## 2021-04-30 DIAGNOSIS — E669 Obesity, unspecified: Secondary | ICD-10-CM

## 2021-04-30 DIAGNOSIS — E7849 Other hyperlipidemia: Secondary | ICD-10-CM

## 2021-04-30 DIAGNOSIS — Z9189 Other specified personal risk factors, not elsewhere classified: Secondary | ICD-10-CM

## 2021-04-30 DIAGNOSIS — Z7984 Long term (current) use of oral hypoglycemic drugs: Secondary | ICD-10-CM

## 2021-04-30 DIAGNOSIS — E1169 Type 2 diabetes mellitus with other specified complication: Secondary | ICD-10-CM

## 2021-04-30 DIAGNOSIS — E559 Vitamin D deficiency, unspecified: Secondary | ICD-10-CM | POA: Diagnosis not present

## 2021-04-30 DIAGNOSIS — Z6837 Body mass index (BMI) 37.0-37.9, adult: Secondary | ICD-10-CM

## 2021-04-30 MED ORDER — VITAMIN D (ERGOCALCIFEROL) 1.25 MG (50000 UNIT) PO CAPS: ORAL_CAPSULE | ORAL | 0 refills | Status: DC

## 2021-04-30 NOTE — Telephone Encounter (Signed)
Dr.Beasley 

## 2021-05-01 DIAGNOSIS — F410 Panic disorder [episodic paroxysmal anxiety] without agoraphobia: Secondary | ICD-10-CM | POA: Diagnosis not present

## 2021-05-01 DIAGNOSIS — F411 Generalized anxiety disorder: Secondary | ICD-10-CM | POA: Diagnosis not present

## 2021-05-02 ENCOUNTER — Other Ambulatory Visit (INDEPENDENT_AMBULATORY_CARE_PROVIDER_SITE_OTHER): Payer: Self-pay | Admitting: Family Medicine

## 2021-05-02 DIAGNOSIS — E1169 Type 2 diabetes mellitus with other specified complication: Secondary | ICD-10-CM

## 2021-05-02 NOTE — Telephone Encounter (Signed)
Dr.Beasley 

## 2021-05-03 NOTE — Progress Notes (Signed)
? ? ? ?Chief Complaint:  ? ?OBESITY ?Tony [name] is here to discuss his progress with his obesity treatment plan along with follow-up of his obesity related diagnoses. Tony [name] is on keeping a food journal and adhering to recommended goals of 1400-1700 calories and 100+ grams of protein daily and states he is following his eating plan approximately 35% of the time. Seven states he is walking for 30 minutes 5 times per week. ? ?Today's visit was #: 23 ?Starting weight: 270 lbs ?Starting date: 04/13/2020 ?Today's weight: 256 lbs ?Today's date: 04/30/2021 ?Total lbs lost to date: 19 ?Total lbs lost since last in-office visit: 0 ? ?Interim History: Tony [name] reports things have not been good since his last visit. He was started on Wellbutrin since his last visit  for anxiety. He stopped due to side effects of jittery and not wanting to do anything. He has been exercising more since his last visit. He is journaling 35% of the time. He does well with breakfast and lunch but struggles with dinner. ? ?Subjective:  ? ?1. Type 2 diabetes mellitus with other specified complication, without long-term current use of insulin (Tony [name]) ?Tony [name] is taking metformin 500 mg daily. He denies side effects. Last A1c was 5.4 on 04/23/2021. I discussed labs with the patient today. ? ?2. Vitamin D deficiency ?Tony [name]'s last Vit D was 31.57 on 04/23/2021. He was taking Vit D 50,000 IU weekly until 8 weeks ago. I discussed labs with the patient today. ? ?3. Other hyperlipidemia ?Tony [name] is taking Crestor 5 mg, and he denies side effects. Last lipids was on 04/23/2021. I discussed labs with the patient today. ? ?4. At risk for heart disease ?Tony [name] is at higher than average risk for cardiovascular disease due to obesity. ? ?Assessment/Plan:  ? ?1. Type 2 diabetes mellitus with other specified complication, without long-term current use of insulin ([place]) ?Tony [name] continue on metformin 500 mg daily. Last Vit D was 31.57 on 04/23/2021. He will continue working on  dietary changes, exercise, and weight loss.  ? ?2. Vitamin D deficiency ?Low Vitamin D level contributes to fatigue and are associated with obesity, breast, and colon cancer. We will refill prescription Vitamin D 50,000 IU every week for 1 month. Tony [name] will follow-up for routine testing of Vitamin D, at least 2-3 times per year to avoid over-replacement. ? ?- Vitamin D, Ergocalciferol, (DRISDOL) 1.25 MG (50000 UNIT) CAPS capsule; TAKE ONE CAPSULE BY MOUTH EVERY 7 DAYS  Dispense: 4 capsule; Refill: 0 ? ?3. Other hyperlipidemia ?Tony [name] will continue to follow up with his primary care physician. He will continue his medications as directed. He will continue working on dietary changes, exercise, and weight loss. ? ?4. At risk for heart disease ?Tony [name] was given approximately 15 minutes of coronary artery disease prevention counseling today. He is 46 y.o. male and has risk factors for heart disease including obesity. We discussed intensive lifestyle modifications today with an emphasis on specific weight loss instructions and strategies. ? ?Repetitive spaced learning was employed today to elicit superior memory formation and behavioral change.  ? ?5. Obesity with current BMI of 37.9 ?Tony [name] is currently in the action stage of change. As such, his goal is to continue with weight loss efforts. He has agreed to keeping a food journal and adhering to recommended goals of 1400-1700 calories and 100+ grams of protein daily.  ? ?Continue journaling. We discussed the Lower carbohydrate plan and Category 3 plan. I recommended clean eats for dinner. ?Exercise goals: As is. ? ?Behavioral modification strategies:  increasing lean protein intake, increasing water intake, no skipping meals, and meal planning and cooking strategies. ? ?Tony [name] has agreed to follow-up with our clinic in 2 to 3 weeks. He was informed of the importance of frequent follow-up visits to maximize his success with intensive lifestyle modifications for his  multiple health conditions.  ? ?Objective:  ? ?Blood pressure 115/76, pulse 65, temperature 98 ?F (36.7 ?C), height 5' 9"  (1.753 m), weight 256 lb (116.1 kg), SpO2 96 %. ?Body mass index is 37.8 kg/m?. ? ?General: Cooperative, alert, well developed, in no acute distress. ?HEENT: Conjunctivae and lids unremarkable. ?Cardiovascular: Regular rhythm.  ?Lungs: Normal work of breathing. ?Neurologic: No focal deficits.  ? ?Lab Results  ?Component Value Date  ? CREATININE 1.07 04/23/2021  ? BUN 19 04/23/2021  ? NA 137 04/23/2021  ? K 3.8 04/23/2021  ? CL 103 04/23/2021  ? CO2 27 04/23/2021  ? ?Lab Results  ?Component Value Date  ? ALT 36 04/23/2021  ? AST 28 04/23/2021  ? ALKPHOS 70 04/23/2021  ? BILITOT 0.4 04/23/2021  ? ?Lab Results  ?Component Value Date  ? HGBA1C 5.4 04/23/2021  ? HGBA1C 5.5 07/10/2020  ? HGBA1C 5.9 03/22/2020  ? HGBA1C 6.2 07/02/2019  ? HGBA1C 5.8 11/13/2018  ? ?Lab Results  ?Component Value Date  ? INSULIN 14.0 07/10/2020  ? INSULIN 35.9 (H) 04/13/2020  ? ?Lab Results  ?Component Value Date  ? TSH 3.38 04/23/2021  ? ?Lab Results  ?Component Value Date  ? CHOL 171 04/23/2021  ? HDL 39.30 04/23/2021  ? [place] 95 04/23/2021  ? LDLDIRECT 157.0 07/02/2019  ? TRIG 188.0 (H) 04/23/2021  ? CHOLHDL 4 04/23/2021  ? ?Lab Results  ?Component Value Date  ? VD25OH 31.57 04/23/2021  ? VD25OH 47.1 07/10/2020  ? VD25OH 33.4 04/13/2020  ? ?Lab Results  ?Component Value Date  ? WBC 4.8 04/23/2021  ? HGB 14.6 04/23/2021  ? HCT 42.7 04/23/2021  ? MCV 94.4 04/23/2021  ? PLT 143.0 (L) 04/23/2021  ? ?No results found for: IRON, TIBC, FERRITIN ? ?Attestation Statements:  ? ?Reviewed by clinician on day of visit: allergies, medications, problem list, medical history, surgical history, family history, social history, and previous encounter notes. ? ? ?I, Trixie Dredge, am acting as transcriptionist for Dennard Nip, MD. ? ?I have reviewed the above documentation for accuracy and completeness, and I agree with the above. -   Dennard Nip, MD ? ? ?

## 2021-05-07 ENCOUNTER — Other Ambulatory Visit (INDEPENDENT_AMBULATORY_CARE_PROVIDER_SITE_OTHER): Payer: Self-pay | Admitting: Family Medicine

## 2021-05-07 DIAGNOSIS — E1169 Type 2 diabetes mellitus with other specified complication: Secondary | ICD-10-CM

## 2021-05-08 NOTE — Telephone Encounter (Signed)
Dr.Uehara ?

## 2021-05-08 NOTE — Telephone Encounter (Signed)
LAST APPOINTMENT DATE: 04/30/21 ?NEXT APPOINTMENT DATE: 05/21/21 ? ? ?[id number] - [place], [place] ?[place] ?[place] [address] ?Phone: [phone] Fax: [phone] ? ?Patient is requesting a refill of the following medications: ?Requested Prescriptions  ? ?Pending Prescriptions Disp Refills  ? metFORMIN (GLUCOPHAGE) 500 MG tablet [Pharmacy Med Name: metFORMIN HCL 500 MG TABLET] 30 tablet 0  ?  Sig: TAKE 1/2 TABLET BY MOUTH WITH BREAKFAST AND 1/2 TABLET BY MOUTH WITH LUNCH  ? ? ?Date last filled: 04/04/21 ?Previously prescribed by Dr. Leafy Ro ? ?Lab Results  ?Component Value Date  ? HGBA1C 5.4 04/23/2021  ? HGBA1C 5.5 07/10/2020  ? HGBA1C 5.9 03/22/2020  ? ?Lab Results  ?Component Value Date  ? MICROALBUR <0.7 04/23/2021  ? [place] 95 04/23/2021  ? CREATININE 1.07 04/23/2021  ? ?Lab Results  ?Component Value Date  ? VD25OH 31.57 04/23/2021  ? VD25OH 47.1 07/10/2020  ? VD25OH 33.4 04/13/2020  ? ? ?BP Readings from Last 3 Encounters:  ?04/30/21 115/76  ?04/26/21 124/72  ?04/23/21 113/76  ? ? ?

## 2021-05-08 NOTE — Telephone Encounter (Signed)
Dr.Beasley 

## 2021-05-18 ENCOUNTER — Encounter (INDEPENDENT_AMBULATORY_CARE_PROVIDER_SITE_OTHER): Payer: Self-pay | Admitting: Family Medicine

## 2021-05-21 ENCOUNTER — Ambulatory Visit (INDEPENDENT_AMBULATORY_CARE_PROVIDER_SITE_OTHER): Payer: BC Managed Care – PPO | Admitting: Family Medicine

## 2021-05-23 ENCOUNTER — Ambulatory Visit: Payer: BC Managed Care – PPO | Admitting: Family Medicine

## 2021-05-25 ENCOUNTER — Other Ambulatory Visit (INDEPENDENT_AMBULATORY_CARE_PROVIDER_SITE_OTHER): Payer: Self-pay | Admitting: Family Medicine

## 2021-05-25 DIAGNOSIS — E559 Vitamin D deficiency, unspecified: Secondary | ICD-10-CM

## 2021-06-03 ENCOUNTER — Other Ambulatory Visit (INDEPENDENT_AMBULATORY_CARE_PROVIDER_SITE_OTHER): Payer: Self-pay | Admitting: Family Medicine

## 2021-06-03 DIAGNOSIS — E1169 Type 2 diabetes mellitus with other specified complication: Secondary | ICD-10-CM

## 2021-06-04 ENCOUNTER — Encounter (INDEPENDENT_AMBULATORY_CARE_PROVIDER_SITE_OTHER): Payer: Self-pay | Admitting: Family Medicine

## 2021-06-04 ENCOUNTER — Other Ambulatory Visit (INDEPENDENT_AMBULATORY_CARE_PROVIDER_SITE_OTHER): Payer: Self-pay | Admitting: Family Medicine

## 2021-06-04 ENCOUNTER — Ambulatory Visit (INDEPENDENT_AMBULATORY_CARE_PROVIDER_SITE_OTHER): Payer: BC Managed Care – PPO | Admitting: Family Medicine

## 2021-06-04 VITALS — BP 119/55 | HR 65 | Temp 98.5°F | Ht 69.0 in | Wt 255.0 lb

## 2021-06-04 DIAGNOSIS — Z6837 Body mass index (BMI) 37.0-37.9, adult: Secondary | ICD-10-CM | POA: Diagnosis not present

## 2021-06-04 DIAGNOSIS — E559 Vitamin D deficiency, unspecified: Secondary | ICD-10-CM

## 2021-06-04 DIAGNOSIS — E1169 Type 2 diabetes mellitus with other specified complication: Secondary | ICD-10-CM

## 2021-06-04 DIAGNOSIS — E669 Obesity, unspecified: Secondary | ICD-10-CM | POA: Diagnosis not present

## 2021-06-04 DIAGNOSIS — R7303 Prediabetes: Secondary | ICD-10-CM

## 2021-06-04 DIAGNOSIS — Z9189 Other specified personal risk factors, not elsewhere classified: Secondary | ICD-10-CM

## 2021-06-04 DIAGNOSIS — Z7984 Long term (current) use of oral hypoglycemic drugs: Secondary | ICD-10-CM

## 2021-06-04 MED ORDER — METFORMIN HCL 500 MG PO TABS: ORAL_TABLET | ORAL | 0 refills | Status: DC

## 2021-06-04 MED ORDER — VITAMIN D (ERGOCALCIFEROL) 1.25 MG (50000 UNIT) PO CAPS: ORAL_CAPSULE | ORAL | 0 refills | Status: DC

## 2021-06-05 NOTE — Progress Notes (Signed)
?Charlann Boxer D.O. ?[facility] ?[place] ?Phone: [phone] ?Subjective:   ?I, Tony [name], am serving as a scribe for Dr. Hulan Saas. ? ?I'm seeing this patient by the request  of:  McGowen, [name] Blackwater, MD ? ?CC: low back pain  ? ?[id number]  ?Tony [name] is a 46 y.o. male coming in with complaint of back and neck pain. OMT 04/26/2021. Patient states back is still sore, not hurting when he gets out of bed which is good. Patient states that he was pulling weeds yesterday and the pain was awful.  ? ?Medications patient has been prescribed: None ? ? ? ? ?  ? ? ? ? ?Reviewed prior external information including notes and imaging from previsou exam, outside providers and external EMR if available.  ? ?As well as notes that were available from care everywhere and other healthcare systems. ? ?Past medical history, social, surgical and family history all reviewed in electronic medical record.  No pertanent information unless stated regarding to the chief complaint.  ? ?Past Medical History:  ?Diagnosis Date  ? Alcohol abuse 2016/2017  ? Pt states he was self medicating his anxiety.  Quit 04/2015.  Minimal intake as of 05/2017 f/u.  ? Anal fissure   ? Anxiety and depression   ? Back pain   ? Borderline diabetes   ? Erectile dysfunction   ? cialis 53m qd-qod started by urol 09/2020  ? Hepatic steatosis 2015; 2016; 02/2016  ? +Alcohol has played a role.  CT abd showed fatty liver+ liver enzymes mildly elevated (chron viral hepatitis screening NEG).  Abd u/s 03/26/16 showed fatty liver, small GB polyps, o/w normal.  ? History of kidney stones   ? [facility]  ? HTN (hypertension)   ? Hx of colonic polyp - ssp 02/11/2014  ? No polyps 02/2020->recall 10 yrs  ? Hyperlipidemia   ? Statin started 05/2018  ? Insomnia   ? Obesity, Class II, BMI 35-39.9   ? Psoriasis   ? Secondary male hypogonadism   ? clomiphene 06/2019: normalized on clomiphene 08/2019  ? Shortness of breath on exertion    ?  ?No Known Allergies ? ? ?Review of Systems: ? No headache, visual changes, nausea, vomiting, diarrhea, constipation, dizziness, abdominal pain, skin rash, fevers, chills, night sweats, weight loss, swollen lymph nodes, body aches, joint swelling, chest pain, shortness of breath, mood changes. POSITIVE muscle aches ? ?Objective  ?Blood pressure 132/78, pulse 71, height 5' 9"  (1.753 m), weight 260 lb (117.9 kg), SpO2 97 %. ?  ?General: No apparent distress alert and oriented x3 mood and affect normal, dressed appropriately.  ?HEENT: Pupils equal, extraocular movements intact  ?Respiratory: Patient's speak in full sentences and does not appear short of breath  ?Cardiovascular: No lower extremity edema, non tender, no erythema  ?Gait normal with good balance and coordination.  ?MSK:  Non tender with full range of motion and good stability and symmetric strength and tone of shoulders, elbows, wrist, hip, knee and ankles bilaterally.  ?Back -low back exam does have some loss of lordosis.  Tenderness to palpation of the paraspinal musculature.  Low back he still has room for improvement in core strength.  Tightness noted with FABER test minorly. ? ?Osteopathic findings ? ?C2 flexed rotated and side bent right ?C7 flexed rotated and side bent left ?T3 extended rotated and side bent right inhaled rib ?T9 extended rotated and side bent left ?L2 flexed rotated and side bent right ?  Sacrum right on right ? ? ?  ?Assessment and Plan: ? ?Degenerative disc disease, lumbar ?Patient is an epidural previously.  Had an exacerbation last year.  Chronic problem with exacerbation seems to be occurring again.  Discussed gabapentin, continue with vitamin D.  Discussed posture and ergonomics as well as core strengthening.  Follow-up again in 6-8  weeks ?  ? ?Nonallopathic problems ? ?Decision today to treat with OMT was based on Physical Exam ? ?After verbal consent patient was treated with HVLA, ME, FPR techniques in cervical, rib,  thoracic, lumbar, and sacral  areas ? ?Patient tolerated the procedure well with improvement in symptoms ? ?Patient given exercises, stretches and lifestyle modifications ? ?See medications in patient instructions if given ? ?Patient will follow up in 4-8 weeks ? ?  ? ? ?The above documentation has been reviewed and is accurate and complete Lyndal Pulley, DO ? ? ? ?  ? ? Note: This dictation was prepared with Dragon dictation along with smaller phrase technology. Any transcriptional errors that result from this process are unintentional.    ?  ?  ? ?

## 2021-06-05 NOTE — Progress Notes (Signed)
? ? ? ?Chief Complaint:  ? ?OBESITY ?Tony [name] is here to discuss his progress with his obesity treatment plan along with follow-up of his obesity related diagnoses. [name] is on keeping a food journal and adhering to recommended goals of 1400-1700 calories and 100+ grams of protein daily and states he is following his eating plan approximately 30% of the time. Donevin states he is walking for 30 minutes 5 times per week. ? ?Today's visit was #: 18 ?Starting weight: 270 lbs ?Starting date: 04/13/2020 ?Today's weight: 255 lbs ?Today's date: 06/04/2021 ?Total lbs lost to date: 2 ?Total lbs lost since last in-office visit: 1 ? ?Interim History: Kodah has been at [employer] and he struggled to find good food options. He has been able to do better now and he is trying to meet his protein goals. ? ?Subjective:  ? ?1. Vitamin D deficiency ?[name]'s last Vitamin D level is not yet at goal. ? ?2. Type 2 diabetes mellitus with other specified complication, without long-term current use of insulin ([place]) ?[name] is stable on metformin, with no side effects noted. He is doing well with his diet and exercise. ? ?3. At risk for impaired metabolic function ?[name] is at increased risk for impaired metabolic function if protein intake is too low.  ? ?Assessment/Plan:  ? ?1. Vitamin D deficiency ?We will refill prescription Vitamin D for 1 month. Mavrik will follow-up for routine testing of Vitamin D, at least 2-3 times per year to avoid over-replacement. ? ?- Vitamin D, Ergocalciferol, (DRISDOL) 1.25 MG (50000 UNIT) CAPS capsule; TAKE ONE CAPSULE BY MOUTH EVERY 7 DAYS  Dispense: 4 capsule; Refill: 0 ? ?2. Type 2 diabetes mellitus with other specified complication, without long-term current use of insulin ([place]) ?Marsean will continue metformin 500 mg 1/2 tablet BID, and we will refill for 1 month. ? ?- metFORMIN (GLUCOPHAGE) 500 MG tablet; TAKE 1/2 TABLET BY MOUTH WITH BREAKFAST AND 1/2 TABLET BY MOUTH WITH LUNCH  Dispense: 30 tablet;  Refill: 0 ? ?3. At risk for impaired metabolic function ?Montrice was given approximately 15 minutes of impaired  metabolic function prevention counseling today. We discussed intensive lifestyle modifications today with an emphasis on specific nutrition and exercise instructions and strategies.  ? ?Repetitive spaced learning was employed today to elicit superior memory formation and behavioral change. ? ?4. Obesity with current BMI of 37.7 ?[name] is currently in the action stage of change. As such, his goal is to continue with weight loss efforts. He has agreed to keeping a food journal and adhering to recommended goals of 1400-1700 calories and 100+ grams of protein daily.  ? ?Exercise goals: As is. ? ?Behavioral modification strategies: increasing lean protein intake and meal planning and cooking strategies. ? ?[name] has agreed to follow-up with our clinic in 3 weeks. He was informed of the importance of frequent follow-up visits to maximize his success with intensive lifestyle modifications for his multiple health conditions.  ? ?Objective:  ? ?Blood pressure (!) 119/55, pulse 65, temperature 98.5 ?F (36.9 ?C), height 5' 9"  (1.753 m), weight 255 lb (115.7 kg), SpO2 96 %. ?Body mass index is 37.66 kg/m?. ? ?General: Cooperative, alert, well developed, in no acute distress. ?HEENT: Conjunctivae and lids unremarkable. ?Cardiovascular: Regular rhythm.  ?Lungs: Normal work of breathing. ?Neurologic: No focal deficits.  ? ?Lab Results  ?Component Value Date  ? CREATININE 1.07 04/23/2021  ? BUN 19 04/23/2021  ? NA 137 04/23/2021  ? K 3.8 04/23/2021  ? CL 103 04/23/2021  ?  CO2 27 04/23/2021  ? ?Lab Results  ?Component Value Date  ? ALT 36 04/23/2021  ? AST 28 04/23/2021  ? ALKPHOS 70 04/23/2021  ? BILITOT 0.4 04/23/2021  ? ?Lab Results  ?Component Value Date  ? HGBA1C 5.4 04/23/2021  ? HGBA1C 5.5 07/10/2020  ? HGBA1C 5.9 03/22/2020  ? HGBA1C 6.2 07/02/2019  ? HGBA1C 5.8 11/13/2018  ? ?Lab Results  ?Component Value Date   ? INSULIN 14.0 07/10/2020  ? INSULIN 35.9 (H) 04/13/2020  ? ?Lab Results  ?Component Value Date  ? TSH 3.38 04/23/2021  ? ?Lab Results  ?Component Value Date  ? CHOL 171 04/23/2021  ? HDL 39.30 04/23/2021  ? [place] 95 04/23/2021  ? LDLDIRECT 157.0 07/02/2019  ? TRIG 188.0 (H) 04/23/2021  ? CHOLHDL 4 04/23/2021  ? ?Lab Results  ?Component Value Date  ? VD25OH 31.57 04/23/2021  ? VD25OH 47.1 07/10/2020  ? VD25OH 33.4 04/13/2020  ? ?Lab Results  ?Component Value Date  ? WBC 4.8 04/23/2021  ? HGB 14.6 04/23/2021  ? HCT 42.7 04/23/2021  ? MCV 94.4 04/23/2021  ? PLT 143.0 (L) 04/23/2021  ? ?No results found for: IRON, TIBC, FERRITIN ? ?Attestation Statements:  ? ?Reviewed by clinician on day of visit: allergies, medications, problem list, medical history, surgical history, family history, social history, and previous encounter notes. ? ? ?I, Trixie Dredge, am acting as transcriptionist for Dennard Nip, MD. ? ?I have reviewed the above documentation for accuracy and completeness, and I agree with the above. -  Dennard Nip, MD ? ? ?

## 2021-06-06 ENCOUNTER — Ambulatory Visit (INDEPENDENT_AMBULATORY_CARE_PROVIDER_SITE_OTHER): Payer: BC Managed Care – PPO | Admitting: Family Medicine

## 2021-06-06 VITALS — BP 132/78 | HR 71 | Ht 69.0 in | Wt 260.0 lb

## 2021-06-06 DIAGNOSIS — M9901 Segmental and somatic dysfunction of cervical region: Secondary | ICD-10-CM | POA: Diagnosis not present

## 2021-06-06 DIAGNOSIS — M9904 Segmental and somatic dysfunction of sacral region: Secondary | ICD-10-CM

## 2021-06-06 DIAGNOSIS — M5136 Other intervertebral disc degeneration, lumbar region: Secondary | ICD-10-CM

## 2021-06-06 DIAGNOSIS — M9903 Segmental and somatic dysfunction of lumbar region: Secondary | ICD-10-CM | POA: Diagnosis not present

## 2021-06-06 DIAGNOSIS — M9908 Segmental and somatic dysfunction of rib cage: Secondary | ICD-10-CM | POA: Diagnosis not present

## 2021-06-06 DIAGNOSIS — M9902 Segmental and somatic dysfunction of thoracic region: Secondary | ICD-10-CM

## 2021-06-06 NOTE — Assessment & Plan Note (Signed)
Patient is an epidural previously.  Had an exacerbation last year.  Chronic problem with exacerbation seems to be occurring again.  Discussed gabapentin, continue with vitamin D.  Discussed posture and ergonomics as well as core strengthening.  Follow-up again in 6-8  weeks ?

## 2021-06-06 NOTE — Patient Instructions (Signed)
Good to see you ?Work on thoracic rotation  ?See me again in 3-4 months ?

## 2021-06-21 LAB — HM DIABETES EYE EXAM

## 2021-06-25 ENCOUNTER — Ambulatory Visit (INDEPENDENT_AMBULATORY_CARE_PROVIDER_SITE_OTHER): Payer: BC Managed Care – PPO | Admitting: Family Medicine

## 2021-06-29 ENCOUNTER — Other Ambulatory Visit (INDEPENDENT_AMBULATORY_CARE_PROVIDER_SITE_OTHER): Payer: Self-pay | Admitting: Family Medicine

## 2021-06-29 DIAGNOSIS — E559 Vitamin D deficiency, unspecified: Secondary | ICD-10-CM

## 2021-07-01 ENCOUNTER — Other Ambulatory Visit (INDEPENDENT_AMBULATORY_CARE_PROVIDER_SITE_OTHER): Payer: Self-pay | Admitting: Family Medicine

## 2021-07-01 DIAGNOSIS — E1169 Type 2 diabetes mellitus with other specified complication: Secondary | ICD-10-CM

## 2021-07-03 ENCOUNTER — Other Ambulatory Visit (INDEPENDENT_AMBULATORY_CARE_PROVIDER_SITE_OTHER): Payer: Self-pay | Admitting: Family Medicine

## 2021-07-03 DIAGNOSIS — F411 Generalized anxiety disorder: Secondary | ICD-10-CM | POA: Diagnosis not present

## 2021-07-03 DIAGNOSIS — E559 Vitamin D deficiency, unspecified: Secondary | ICD-10-CM

## 2021-07-03 DIAGNOSIS — F410 Panic disorder [episodic paroxysmal anxiety] without agoraphobia: Secondary | ICD-10-CM | POA: Diagnosis not present

## 2021-07-05 NOTE — Progress Notes (Signed)
?TeleHealth Visit:  ?This visit was completed with telemedicine (audio/video) technology. ?Tony [name] has verbally consented to this TeleHealth visit. The patient is located at home, the provider is located at home. The participants in this visit include the listed provider and patient. The visit was conducted today via MyChart video. ? ?OBESITY ?[name] is here to discuss his progress with his obesity treatment plan along with follow-up of his obesity related diagnoses.  ? ?Today's visit was # 19 ?Starting weight: 270 lbs ?Starting date: 04/13/2020 ?Weight at last in office visit: 255 lbs on 06/04/21 ?Total weight loss: 15 lbs at last in office visit on 06/04/21. ?Today's reported weight: No weight reported. ? ?Nutrition Plan: keeping a food journal and adhering to recommended goals of 1400-1700 calories and 100+ gms protein at 65% ?Hunger is well controlled. Cravings are moderately controlled.  ?Current exercise:  walk 30-60 minutes daily. No strength training recently.  ? ?Interim History: Tony [name] notes that he has never kept track of calories or protein despite being on journaling as his meal plan for past last year.  His weight has been essentially plateaued over the last year.  He lost 15 pounds during the first few months on our plan but no weight loss since then.  He tries to make good choices and eat plenty of protein.  He denies intake of sugar sweetened beverages.  He has some unintentional sabotage from his wife who is thin and does not eat a healthy diet. ?Today he is headed out of town to stay at his beach house for 6 to 8 weeks.  He feels he does better on his meal plan when he is away from his wife. ? ?Assessment/Plan:  ?1. Type II Diabetes ?HgbA1c is at goal. ?CBGs: Not checking ?Episodes of hypoglycemia? yes - if very active and does not eat regularly.  ?Medication(s): metformin 500 mg daily. ? ?Lab Results  ?Component Value Date  ? HGBA1C 5.4 04/23/2021  ? HGBA1C 5.5 07/10/2020  ? HGBA1C 5.9  03/22/2020  ? ?Lab Results  ?Component Value Date  ? MICROALBUR <0.7 04/23/2021  ? [place] 95 04/23/2021  ? CREATININE 1.07 04/23/2021  ? ? ?Plan: ?Refill metformin 500 mg daily.  Patient request 90-day supply. ? ? ?2. Vitamin D Deficiency ?Vitamin D is not at goal of 50. He is on weekly prescription Vitamin D 50,000 IU.  ?Lab Results  ?Component Value Date  ? VD25OH 31.57 04/23/2021  ? VD25OH 47.1 07/10/2020  ? VD25OH 33.4 04/13/2020  ? ? ?Plan: ?Refill prescription vitamin D 50,000 IU weekly. ?Patient request 90-day supply. ? ?Obesity: Current BMI 37.64 ?Tony [name] is currently in the action stage of change. As such, his goal is to continue with weight loss efforts.  ?He has agreed to keeping a food journal and adhering to recommended goals of 1400-1700 calories and 100+ gms protein .  ? ?Exercise goals: Continue walking 30 to 60 minutes/day.  Engage in strength training when not doing physical labor. ?Behavioral modification strategies: increasing lean protein intake, decreasing simple carbohydrates, planning for success, and keeping a strict food journal. ?Encouraged him to journal consistently using Lose It!Marland Kitchen ? ?Tony [name] has agreed to follow-up with our clinic in 3 weeks.  ? ?No orders of the defined types were placed in this encounter. ? ? ?There are no discontinued medications.  ? ?No orders of the defined types were placed in this encounter. ?   ? ?Objective:  ? ?VITALS: Per patient if applicable, see vitals. ?GENERAL: Alert and in no acute distress. ?  CARDIOPULMONARY: No increased WOB. Speaking in clear sentences.  ?PSYCH: Pleasant and cooperative. Speech normal rate and rhythm. Affect is appropriate. Insight and judgement are appropriate. Attention is focused, linear, and appropriate.  ?NEURO: Oriented as arrived to appointment on time with no prompting.  ? ?Lab Results  ?Component Value Date  ? CREATININE 1.07 04/23/2021  ? BUN 19 04/23/2021  ? NA 137 04/23/2021  ? K 3.8 04/23/2021  ? CL 103 04/23/2021  ? CO2 27  04/23/2021  ? ?Lab Results  ?Component Value Date  ? ALT 36 04/23/2021  ? AST 28 04/23/2021  ? ALKPHOS 70 04/23/2021  ? BILITOT 0.4 04/23/2021  ? ?Lab Results  ?Component Value Date  ? HGBA1C 5.4 04/23/2021  ? HGBA1C 5.5 07/10/2020  ? HGBA1C 5.9 03/22/2020  ? HGBA1C 6.2 07/02/2019  ? HGBA1C 5.8 11/13/2018  ? ?Lab Results  ?Component Value Date  ? INSULIN 14.0 07/10/2020  ? INSULIN 35.9 (H) 04/13/2020  ? ?Lab Results  ?Component Value Date  ? TSH 3.38 04/23/2021  ? ?Lab Results  ?Component Value Date  ? CHOL 171 04/23/2021  ? HDL 39.30 04/23/2021  ? [place] 95 04/23/2021  ? LDLDIRECT 157.0 07/02/2019  ? TRIG 188.0 (H) 04/23/2021  ? CHOLHDL 4 04/23/2021  ? ?Lab Results  ?Component Value Date  ? WBC 4.8 04/23/2021  ? HGB 14.6 04/23/2021  ? HCT 42.7 04/23/2021  ? MCV 94.4 04/23/2021  ? PLT 143.0 (L) 04/23/2021  ? ?No results found for: IRON, TIBC, FERRITIN ?Lab Results  ?Component Value Date  ? VD25OH 31.57 04/23/2021  ? VD25OH 47.1 07/10/2020  ? VD25OH 33.4 04/13/2020  ? ? ?Attestation Statements:  ? ?Reviewed by clinician on day of visit: allergies, medications, problem list, medical history, surgical history, family history, social history, and previous encounter notes. ? ? ?

## 2021-07-09 ENCOUNTER — Telehealth (INDEPENDENT_AMBULATORY_CARE_PROVIDER_SITE_OTHER): Payer: BC Managed Care – PPO | Admitting: Family Medicine

## 2021-07-09 ENCOUNTER — Encounter (INDEPENDENT_AMBULATORY_CARE_PROVIDER_SITE_OTHER): Payer: Self-pay | Admitting: Family Medicine

## 2021-07-09 DIAGNOSIS — Z6837 Body mass index (BMI) 37.0-37.9, adult: Secondary | ICD-10-CM

## 2021-07-09 DIAGNOSIS — E559 Vitamin D deficiency, unspecified: Secondary | ICD-10-CM | POA: Diagnosis not present

## 2021-07-09 DIAGNOSIS — E669 Obesity, unspecified: Secondary | ICD-10-CM | POA: Diagnosis not present

## 2021-07-09 DIAGNOSIS — E66812 Obesity, class 2: Secondary | ICD-10-CM | POA: Insufficient documentation

## 2021-07-09 DIAGNOSIS — E1169 Type 2 diabetes mellitus with other specified complication: Secondary | ICD-10-CM

## 2021-07-09 MED ORDER — VITAMIN D (ERGOCALCIFEROL) 1.25 MG (50000 UNIT) PO CAPS: ORAL_CAPSULE | ORAL | 0 refills | Status: DC

## 2021-07-09 MED ORDER — METFORMIN HCL 500 MG PO TABS: 500.0000 mg | ORAL_TABLET | Freq: Every day | ORAL | 0 refills | Status: DC

## 2021-07-28 DIAGNOSIS — L255 Unspecified contact dermatitis due to plants, except food: Secondary | ICD-10-CM | POA: Diagnosis not present

## 2021-08-05 NOTE — Progress Notes (Unsigned)
TeleHealth Visit:  This visit was completed with telemedicine (audio/video) technology. Tony [name] has verbally consented to this TeleHealth visit. The patient is located at home, the provider is located at home. The participants in this visit include the listed provider and patient. The visit was conducted today via MyChart video.  OBESITY Tony [name] is here to discuss his progress with his obesity treatment plan along with follow-up of his obesity related diagnoses.   Today's visit was # 20 Starting weight: 270 lbs Starting date: 04/13/2020 Weight at last in office visit: 255 lbs on 06/04/21 Total weight loss: 15 lbs at last in office visit on 06/04/21. Today's reported weight: 254.6 lbs   Nutrition Plan: keeping a food journal and adhering to recommended goals of 1400-1600 calories and 100 gms protein.  Hunger is well controlled.  Current exercise: usually lifts weight but has not recently.  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  Working 12 to 13-hour days.  Generally has protein shake at breakfast and then does not eat much again until late that night.  He does not feel like eating when he is hot and sweaty from working. He is not journaling and reports he has not journaled since he has been seeing [place]. Denies intake of caloric beverages.  He drinks about 64 ounces of Milo's 0-calorie tea daily. His schedule will improve June 21 and he thinks he will be able to journal after that.  Assessment/Plan:  1. Type II Diabetes HgbA1c is at goal-5.4 on 04/23/2021. Medication(s): Metformin 500 mg daily.  Lab Results  Component Value Date   HGBA1C 5.4 04/23/2021   HGBA1C 5.5 07/10/2020   HGBA1C 5.9 03/22/2020   Lab Results  Component Value Date   MICROALBUR <0.7 04/23/2021   [place] 95 04/23/2021   CREATININE 1.07 04/23/2021    Plan: Continue metformin 500 mg daily.   2.  Vitamin D Deficiency Vitamin D is not at goal of 50-last vitamin D was 31 on April 23, 2021.Marland Kitchen He is on weekly prescription Vitamin D 50,000 IU.  Lab Results  Component Value Date   VD25OH 31.57 04/23/2021   VD25OH 47.1 07/10/2020   VD25OH 33.4 04/13/2020    Plan: Continue prescription vitamin D 50,000 IU weekly.    3. Obesity: Current BMI 37.64 Tony [name] is currently in the action stage of change. As such, his goal is to continue with weight loss efforts.  He has agreed to keeping a food journal and adhering to recommended goals of 1400-1600 calories and 100 gms protein. .   Encouraged him to journal once his schedule improves.  Exercise goals: as is.  Behavioral modification strategies: increasing lean protein intake, no skipping meals, and keeping a strict food journal.  Tony [name] has agreed to follow-up with our clinic in 3 weeks.   No orders of the defined types were placed in this encounter.   There are no discontinued medications.   No orders of the defined types were placed in this encounter.     Objective:   VITALS: Per patient if applicable, see vitals. GENERAL: Alert and in no acute distress. CARDIOPULMONARY: No increased WOB. Speaking in clear sentences.  PSYCH: Pleasant and cooperative. Speech normal rate and rhythm. Affect is appropriate. Insight and judgement are appropriate. Attention is focused, linear, and appropriate.  NEURO: Oriented as arrived to appointment on time with no prompting.   Lab Results  Component Value Date  CREATININE 1.07 04/23/2021   BUN 19 04/23/2021   NA 137 04/23/2021   K 3.8 04/23/2021   CL 103 04/23/2021   CO2 27 04/23/2021   Lab Results  Component Value Date   ALT 36 04/23/2021   AST 28 04/23/2021   ALKPHOS 70 04/23/2021   BILITOT 0.4 04/23/2021   Lab Results  Component Value Date   HGBA1C 5.4 04/23/2021   HGBA1C 5.5 07/10/2020   HGBA1C 5.9 03/22/2020   HGBA1C 6.2 07/02/2019   HGBA1C 5.8 11/13/2018   Lab Results   Component Value Date   INSULIN 14.0 07/10/2020   INSULIN 35.9 (H) 04/13/2020   Lab Results  Component Value Date   TSH 3.38 04/23/2021   Lab Results  Component Value Date   CHOL 171 04/23/2021   HDL 39.30 04/23/2021   LDLCALC 95 04/23/2021   LDLDIRECT 157.0 07/02/2019   TRIG 188.0 (H) 04/23/2021   CHOLHDL 4 04/23/2021   Lab Results  Component Value Date   WBC 4.8 04/23/2021   HGB 14.6 04/23/2021   HCT 42.7 04/23/2021   MCV 94.4 04/23/2021   PLT 143.0 (L) 04/23/2021   No results found for: "IRON", "TIBC", "FERRITIN" Lab Results  Component Value Date   VD25OH 31.57 04/23/2021   VD25OH 47.1 07/10/2020   VD25OH 33.4 04/13/2020    Attestation Statements:   Reviewed by clinician on day of visit: allergies, medications, problem list, medical history, surgical history, family history, social history, and previous encounter notes.  Time spent on visit including pre-visit chart review and post-visit charting and care was 31 minutes.  This time included: -preparing to see the patient (e.g., review of tests) -obtaining and/or reviewing separately obtained history -counseling and educating the patient/family/caregiver -documenting clinical information in the electronic or other health record

## 2021-08-06 ENCOUNTER — Encounter (INDEPENDENT_AMBULATORY_CARE_PROVIDER_SITE_OTHER): Payer: Self-pay | Admitting: Family Medicine

## 2021-08-06 ENCOUNTER — Telehealth (INDEPENDENT_AMBULATORY_CARE_PROVIDER_SITE_OTHER): Payer: BC Managed Care – PPO | Admitting: Family Medicine

## 2021-08-06 DIAGNOSIS — E559 Vitamin D deficiency, unspecified: Secondary | ICD-10-CM | POA: Diagnosis not present

## 2021-08-06 DIAGNOSIS — E669 Obesity, unspecified: Secondary | ICD-10-CM

## 2021-08-06 DIAGNOSIS — Z6837 Body mass index (BMI) 37.0-37.9, adult: Secondary | ICD-10-CM

## 2021-08-06 DIAGNOSIS — E1169 Type 2 diabetes mellitus with other specified complication: Secondary | ICD-10-CM

## 2021-08-06 DIAGNOSIS — Z7984 Long term (current) use of oral hypoglycemic drugs: Secondary | ICD-10-CM

## 2021-08-24 NOTE — Progress Notes (Signed)
[place] Phone: [phone] Subjective:   Tony [name], am serving as a scribe for Dr. Hulan Saas.  I'm seeing this patient by the request  of:  McGowen, Adrian Blackwater, MD  CC: back and neck pain   TGG:[id number]  Tony [name] is a 46 y.o. male coming in with complaint of back and neck pain. OMT on 06/06/2021. Patient states that he continues to have back pain but it is not as bad as it was when we first stated seeing patient.   Mentions working outside and developed pain and numbness in both wrists up to his elbows. Had hard time making a fist. 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 [name] history. Did not notice any pain in neck.   Medications patient has been prescribed: None           Reviewed prior external information including notes and imaging from previsou exam, outside providers and external EMR if available.   As well as notes that were available from care everywhere and other healthcare systems.  Past medical history, social, surgical and family history all reviewed in electronic medical record.  [name] pertanent information unless stated regarding to the chief complaint.   Past Medical History:  Diagnosis Date   Alcohol abuse 2016/2017   Pt states he was self medicating his anxiety.  Quit 04/2015.  Minimal intake as of 05/2017 f/u.   Anal fissure    Anxiety and depression    Back pain    Borderline diabetes    Erectile dysfunction    cialis 74m qd-qod started by urol 09/2020   Hepatic steatosis 2015; 2016; 02/2016   +Alcohol has played a role.  CT abd showed fatty liver+ liver enzymes mildly elevated (chron viral hepatitis screening NEG).  Abd u/s 03/26/16 showed fatty liver, small GB polyps, o/w normal.   History of kidney stones    [facility]   HTN (hypertension)    Hx of colonic polyp - ssp 02/11/2014   [name] polyps 02/2020->recall 10 yrs   Hyperlipidemia    Statin started  05/2018   Insomnia    Obesity, Class II, BMI 35-39.9    Psoriasis    Secondary male hypogonadism    clomiphene 06/2019: normalized on clomiphene 08/2019   Shortness of breath on exertion     [name] Known Allergies   Review of Systems:  [name] headache, visual changes, nausea, vomiting, diarrhea, constipation, dizziness, abdominal pain, skin rash, fevers, chills, night sweats, weight loss, swollen lymph nodes, body aches, joint swelling, chest pain, shortness of breath, mood changes. POSITIVE muscle aches  Objective  Blood pressure 130/84, pulse 85, height 5' 9"  (1.753 m), weight 263 lb (119.3 kg), SpO2 97 %.   General: [name] apparent distress alert and oriented x3 mood and affect normal, dressed appropriately.  HEENT: Pupils equal, extraocular movements intact  Respiratory: Patient's speak in full sentences and does not appear short of breath  Cardiovascular: [name] lower extremity edema, non tender, [name] erythema  Gait antalgic mild  MSK:  Back low back exam does have some loss of lordosis.  Patient does have some tightness noted with range of motion.  Tightness with FABER test right greater than left  Osteopathic findings  C2 flexed rotated and side bent right C5 flexed rotated and side bent left T3 extended rotated and side bent right inhaled rib T9 extended rotated and side bent left L1 flexed rotated and side bent  right Sacrum right on right     Assessment and Plan:  Degenerative disc disease, lumbar Patient back does have some loss of lordosis.  Has had some difficulty previously.  Patient has responded to epidurals previously and can consider repeating if necessary.  He still has tightness noted.  Discussed potentially increasing the gabapentin.  Could go up to 300 mg and we will see how patient responds.  If patient does like the higher dose we can send a new prescription.  Follow-up again in 6 to 8 weeks    Nonallopathic problems  Decision today to treat with OMT was based on  Physical Exam  After verbal consent patient was treated with HVLA, ME, FPR techniques in cervical, rib, thoracic, lumbar, and sacral  areas  Patient tolerated the procedure well with improvement in symptoms  Patient given exercises, stretches and lifestyle modifications  See medications in patient instructions if given  Patient will follow up in 4-8 weeks    The above documentation has been reviewed and is accurate and complete Lyndal Pulley, DO          Note: This dictation was prepared with Dragon dictation along with smaller phrase technology. Any transcriptional errors that result from this process are unintentional.

## 2021-08-30 ENCOUNTER — Ambulatory Visit (INDEPENDENT_AMBULATORY_CARE_PROVIDER_SITE_OTHER): Payer: BC Managed Care – PPO | Admitting: Family Medicine

## 2021-09-04 ENCOUNTER — Encounter (INDEPENDENT_AMBULATORY_CARE_PROVIDER_SITE_OTHER): Payer: Self-pay | Admitting: Family Medicine

## 2021-09-04 ENCOUNTER — Encounter: Payer: Self-pay | Admitting: Family Medicine

## 2021-09-04 ENCOUNTER — Ambulatory Visit (INDEPENDENT_AMBULATORY_CARE_PROVIDER_SITE_OTHER): Payer: BC Managed Care – PPO | Admitting: Family Medicine

## 2021-09-04 VITALS — BP 127/83 | HR 78 | Temp 98.2°F | Ht 69.0 in | Wt 260.0 lb

## 2021-09-04 VITALS — BP 130/84 | HR 85 | Ht 69.0 in | Wt 263.0 lb

## 2021-09-04 DIAGNOSIS — M9902 Segmental and somatic dysfunction of thoracic region: Secondary | ICD-10-CM

## 2021-09-04 DIAGNOSIS — Z6838 Body mass index (BMI) 38.0-38.9, adult: Secondary | ICD-10-CM | POA: Diagnosis not present

## 2021-09-04 DIAGNOSIS — M51369 Other intervertebral disc degeneration, lumbar region without mention of lumbar back pain or lower extremity pain: Secondary | ICD-10-CM

## 2021-09-04 DIAGNOSIS — E669 Obesity, unspecified: Secondary | ICD-10-CM

## 2021-09-04 DIAGNOSIS — R7303 Prediabetes: Secondary | ICD-10-CM

## 2021-09-04 DIAGNOSIS — E559 Vitamin D deficiency, unspecified: Secondary | ICD-10-CM

## 2021-09-04 DIAGNOSIS — M5136 Other intervertebral disc degeneration, lumbar region: Secondary | ICD-10-CM | POA: Diagnosis not present

## 2021-09-04 DIAGNOSIS — M9901 Segmental and somatic dysfunction of cervical region: Secondary | ICD-10-CM | POA: Diagnosis not present

## 2021-09-04 DIAGNOSIS — M9903 Segmental and somatic dysfunction of lumbar region: Secondary | ICD-10-CM | POA: Diagnosis not present

## 2021-09-04 DIAGNOSIS — M9904 Segmental and somatic dysfunction of sacral region: Secondary | ICD-10-CM | POA: Diagnosis not present

## 2021-09-04 DIAGNOSIS — M9908 Segmental and somatic dysfunction of rib cage: Secondary | ICD-10-CM | POA: Diagnosis not present

## 2021-09-04 DIAGNOSIS — Z9189 Other specified personal risk factors, not elsewhere classified: Secondary | ICD-10-CM

## 2021-09-04 MED ORDER — METFORMIN HCL 500 MG PO TABS: 500.0000 mg | ORAL_TABLET | Freq: Every day | ORAL | 0 refills | Status: DC

## 2021-09-04 MED ORDER — VITAMIN D (ERGOCALCIFEROL) 1.25 MG (50000 UNIT) PO CAPS: ORAL_CAPSULE | ORAL | 0 refills | Status: DC

## 2021-09-04 NOTE — Assessment & Plan Note (Signed)
Patient back does have some loss of lordosis.  Has had some difficulty previously.  Patient has responded to epidurals previously and can consider repeating if necessary.  He still has tightness noted.  Discussed potentially increasing the gabapentin.  Could go up to 300 mg and we will see how patient responds.  If patient does like the higher dose we can send a new prescription.  Follow-up again in 6 to 8 weeks

## 2021-09-04 NOTE — Patient Instructions (Signed)
Good to see you Carpal tunnel exercises Care with heavy machinery See me in 6-8 weeks

## 2021-09-10 NOTE — Progress Notes (Signed)
Chief Complaint:   OBESITY Tony [name] is here to discuss his progress with his obesity treatment plan along with follow-up of his obesity related diagnoses. Tony [name] is on keeping a food journal and adhering to recommended goals of 1400-1600 calories and 100+ protein and states he is following his eating plan approximately 45% of the time. Tony [name] states he is walking 60 minutes 7 times per week.  Today's visit was #: 20 Starting weight: 270 lbs Starting date: 04/13/2020 Today's weight: 260 lbs Today's date: 09/04/2021 Total lbs lost to date: 10 lbs Total lbs lost since last in-office visit: +5  Interim History: Tony [name] has been working outside on [employer] 12+ hours daily.  Had gotten poison ivy and has had to take prednisone which increased his hunger and he ate more.  Eats out once every other day.  He needs help with snacks.  Subjective:   1. Prediabetes Tony [name] has a diagnosis of prediabetes based on his elevated HgA1c and was informed this puts him at greater risk of developing diabetes. He continues to work on diet and exercise to decrease his risk of diabetes. He denies nausea or hypoglycemia.  2. Vitamin D deficiency He is currently taking prescription vitamin D 50,000 IU each week. He denies nausea, vomiting or muscle weakness.  3. At risk for malnutrition Tony [name] is at increased risk for malnutrition due to obesity.   Assessment/Plan:  No orders of the defined types were placed in this encounter.   Medications Discontinued During This Encounter  Medication Reason   Vitamin D, Ergocalciferol, (DRISDOL) 1.25 MG (50000 UNIT) CAPS capsule Reorder   metFORMIN (GLUCOPHAGE) 500 MG tablet Reorder     Meds ordered this encounter  Medications   Vitamin D, Ergocalciferol, (DRISDOL) 1.25 MG (50000 UNIT) CAPS capsule    Sig: TAKE ONE CAPSULE BY MOUTH EVERY 7 DAYS    Dispense:  4 capsule    Refill:  0   metFORMIN (GLUCOPHAGE) 500 MG tablet    Sig: Take 1 tablet (500 mg total)  by mouth daily with breakfast.    Dispense:  30 tablet    Refill:  0     1. Prediabetes Tony [name] will continue to work on weight loss, exercise, and decreasing simple carbohydrates to help decrease the risk of diabetes.   Refill- metFORMIN (GLUCOPHAGE) 500 MG tablet; Take 1 tablet (500 mg total) by mouth daily with breakfast.  Dispense: 30 tablet; Refill: 0  2. Vitamin D deficiency Tony [name] will continue to work on weight loss, exercise, and decreasing simple carbohydrates to help decrease the risk of diabetes.   Refill - Vitamin D, Ergocalciferol, (DRISDOL) 1.25 MG (50000 UNIT) CAPS capsule; TAKE ONE CAPSULE BY MOUTH EVERY 7 DAYS  Dispense: 4 capsule; Refill: 0  3. At risk for malnutrition Tony [name] was given extensive malnutrition prevention education and counseling today of more than 9 minutes.  Counseled him that malnutrition refers to inappropriate nutrients or not the right balance of nutrients for optimal health.  Discussed with Tony [name] that it is absolutely possible to be malnourished but yet obese.  Risk factors, including but not limited to, inappropriate dietary choices, difficulty with obtaining food due to physical or financial limitations, and various physical and mental health conditions were reviewed with Tony [name].     4. Obesity with current BMI of 38.4 Tony [name] was given level of support handout.  He is to bring in journaling log to next office visit.  Use the app lose it or my  fitness pal.    Tony [name] is currently in the action stage of change. As such, his goal is to continue with weight loss efforts. He has agreed to keeping a food journal and adhering to recommended goals of 1400-1600 calories and 120+ protein daily.    Exercise goals:  As is.   Behavioral modification strategies: increasing lean protein intake, decreasing simple carbohydrates, and planning for success.  Tony [name] has agreed to follow-up with our clinic in 2-3 weeks. He was informed of the importance  of frequent follow-up visits to maximize his success with intensive lifestyle modifications for his multiple health conditions.   Objective:   Blood pressure 127/83, pulse 78, temperature 98.2 F (36.8 C), height 5' 9"  (1.753 m), weight 260 lb (117.9 kg), SpO2 97 %. Body mass index is 38.4 kg/m.  General: Cooperative, alert, well developed, in no acute distress. HEENT: Conjunctivae and lids unremarkable. Cardiovascular: Regular rhythm.  Lungs: Normal work of breathing. Neurologic: No focal deficits.   Lab Results  Component Value Date   CREATININE 1.07 04/23/2021   BUN 19 04/23/2021   NA 137 04/23/2021   K 3.8 04/23/2021   CL 103 04/23/2021   CO2 27 04/23/2021   Lab Results  Component Value Date   ALT 36 04/23/2021   AST 28 04/23/2021   ALKPHOS 70 04/23/2021   BILITOT 0.4 04/23/2021   Lab Results  Component Value Date   HGBA1C 5.4 04/23/2021   HGBA1C 5.5 07/10/2020   HGBA1C 5.9 03/22/2020   HGBA1C 6.2 07/02/2019   HGBA1C 5.8 11/13/2018   Lab Results  Component Value Date   INSULIN 14.0 07/10/2020   INSULIN 35.9 (H) 04/13/2020   Lab Results  Component Value Date   TSH 3.38 04/23/2021   Lab Results  Component Value Date   CHOL 171 04/23/2021   HDL 39.30 04/23/2021   LDLCALC 95 04/23/2021   LDLDIRECT 157.0 07/02/2019   TRIG 188.0 (H) 04/23/2021   CHOLHDL 4 04/23/2021   Lab Results  Component Value Date   VD25OH 31.57 04/23/2021   VD25OH 47.1 07/10/2020   VD25OH 33.4 04/13/2020   Lab Results  Component Value Date   WBC 4.8 04/23/2021   HGB 14.6 04/23/2021   HCT 42.7 04/23/2021   MCV 94.4 04/23/2021   PLT 143.0 (L) 04/23/2021   No results found for: "IRON", "TIBC", "FERRITIN"  Attestation Statements:   Reviewed by clinician on day of visit: allergies, medications, problem list, medical history, surgical history, family history, social history, and previous encounter notes.  I, Tony [name], RMA, am acting as Location manager for [employer],  DO.  I have reviewed the above documentation for accuracy and completeness, and I agree with the above. Tony [name], D.O.  The [place] was signed into law in 2016 which includes the topic of electronic health records.  This provides immediate access to information in MyChart.  This includes consultation notes, operative notes, office notes, lab results and pathology reports.  If you have any questions about what you read please let us know at your next visit so we can discuss your concerns and take corrective action if need be.  We are right here with you.

## 2021-09-19 ENCOUNTER — Telehealth (INDEPENDENT_AMBULATORY_CARE_PROVIDER_SITE_OTHER): Payer: BC Managed Care – PPO | Admitting: Family Medicine

## 2021-09-19 ENCOUNTER — Encounter: Payer: Self-pay | Admitting: Family Medicine

## 2021-09-19 ENCOUNTER — Other Ambulatory Visit: Payer: Self-pay

## 2021-09-19 MED ORDER — GABAPENTIN 300 MG PO CAPS: 300.0000 mg | ORAL_CAPSULE | Freq: Every day | ORAL | 0 refills | Status: DC

## 2021-09-25 DIAGNOSIS — F410 Panic disorder [episodic paroxysmal anxiety] without agoraphobia: Secondary | ICD-10-CM | POA: Diagnosis not present

## 2021-09-25 DIAGNOSIS — F411 Generalized anxiety disorder: Secondary | ICD-10-CM | POA: Diagnosis not present

## 2021-09-26 ENCOUNTER — Other Ambulatory Visit: Payer: Self-pay | Admitting: Family Medicine

## 2021-09-26 ENCOUNTER — Ambulatory Visit (INDEPENDENT_AMBULATORY_CARE_PROVIDER_SITE_OTHER): Payer: BC Managed Care – PPO | Admitting: Family Medicine

## 2021-09-26 ENCOUNTER — Encounter (INDEPENDENT_AMBULATORY_CARE_PROVIDER_SITE_OTHER): Payer: Self-pay | Admitting: Family Medicine

## 2021-09-26 VITALS — BP 124/82 | HR 62 | Temp 98.4°F | Ht 69.0 in | Wt 254.0 lb

## 2021-09-26 DIAGNOSIS — E559 Vitamin D deficiency, unspecified: Secondary | ICD-10-CM

## 2021-09-26 DIAGNOSIS — E669 Obesity, unspecified: Secondary | ICD-10-CM

## 2021-09-26 DIAGNOSIS — Z7984 Long term (current) use of oral hypoglycemic drugs: Secondary | ICD-10-CM

## 2021-09-26 DIAGNOSIS — Z6837 Body mass index (BMI) 37.0-37.9, adult: Secondary | ICD-10-CM

## 2021-09-26 DIAGNOSIS — R7303 Prediabetes: Secondary | ICD-10-CM | POA: Diagnosis not present

## 2021-09-26 DIAGNOSIS — Z9189 Other specified personal risk factors, not elsewhere classified: Secondary | ICD-10-CM

## 2021-09-26 MED ORDER — VITAMIN D (ERGOCALCIFEROL) 1.25 MG (50000 UNIT) PO CAPS: ORAL_CAPSULE | ORAL | 0 refills | Status: DC

## 2021-09-26 MED ORDER — METFORMIN HCL 500 MG PO TABS: 500.0000 mg | ORAL_TABLET | Freq: Every day | ORAL | 0 refills | Status: DC

## 2021-10-01 NOTE — Progress Notes (Unsigned)
Chief Complaint:   OBESITY [name] is here to discuss his progress with his obesity treatment plan along with follow-up of his obesity related diagnoses. Tony [name] is on keeping a food journal and adhering to recommended goals of 1400-1600 calories and 120+ grams protein and states he is following his eating plan approximately 80% of the time. [name] states he is walking and weight training 30-60 minutes 3-7 times per week.  Today's visit was #: 21 Starting weight: 270 lbs Starting date: 04/13/2020 Today's weight: 254 lbs Today's date: 09/26/2021 Total lbs lost to date: 16 Total lbs lost since last in-office visit: 6  Interim History: Tony [name] is here for a follow up office visit.  We reviewed his meal plan and all questions were answered.  Patient's food recall appears to be accurate and consistent with what is on plan when he is following it.   When eating on plan, his hunger and cravings are well controlled.   [name] has not been our recommendations for calorie or protein intake daily. He is hitting 1700 calories and 75 grams of protein.  Subjective:   1. Prediabetes Tony [name] has a diagnosis of prediabetes based on his elevated HgA1c and was informed this puts him at greater risk of developing diabetes. He continues to work on diet and exercise to decrease his risk of diabetes. He denies nausea or hypoglycemia. Pt denies trouble with hunger or carb cravings.  2. Vitamin D deficiency He is currently taking prescription vitamin D 50,000 IU each week. He denies nausea, vomiting or muscle weakness.  3. At risk for malnutrition Tony [name] is at risk for malnutrition due to inadequate protein intake.  Assessment/Plan:  No orders of the defined types were placed in this encounter.   Medications Discontinued During This Encounter  Medication Reason   Vitamin D, Ergocalciferol, (DRISDOL) 1.25 MG (50000 UNIT) CAPS capsule Reorder   metFORMIN (GLUCOPHAGE) 500 MG tablet Reorder     Meds  ordered this encounter  Medications   Vitamin D, Ergocalciferol, (DRISDOL) 1.25 MG (50000 UNIT) CAPS capsule    Sig: TAKE ONE CAPSULE BY MOUTH EVERY 7 DAYS    Dispense:  4 capsule    Refill:  0   metFORMIN (GLUCOPHAGE) 500 MG tablet    Sig: Take 1 tablet (500 mg total) by mouth daily with breakfast.    Dispense:  30 tablet    Refill:  0     1. Prediabetes Win will continue to work on weight loss, exercise, and decreasing simple carbohydrates to help decrease the risk of diabetes. Continue Metformin 500 mg.  Refill- metFORMIN (GLUCOPHAGE) 500 MG tablet; Take 1 tablet (500 mg total) by mouth daily with breakfast.  Dispense: 30 tablet; Refill: 0  2. Vitamin D deficiency Low Vitamin D level contributes to fatigue and are associated with obesity, breast, and colon cancer. He agrees to continue to take prescription Vitamin D @50 ,000 IU every week and will follow-up for routine testing of Vitamin D, at least 2-3 times per year to avoid over-replacement.  Refill- Vitamin D, Ergocalciferol, (DRISDOL) 1.25 MG (50000 UNIT) CAPS capsule; TAKE ONE CAPSULE BY MOUTH EVERY 7 DAYS  Dispense: 4 capsule; Refill: 0  3. At risk for malnutrition Tony [name] was given extensive malnutrition prevention education and counseling today of more than 15 minutes.  Counseled him that malnutrition refers to inappropriate nutrients or not the right balance of nutrients for optimal health.  Discussed with [name] that it is absolutely possible to be malnourished  but yet obese.  Risk factors, including but not limited to, inappropriate dietary choices, difficulty with obtaining food due to physical or financial limitations, and various physical and mental health conditions were reviewed with [name].   4. Obesity with current BMI of 37.5 Tony [name] is currently in the action stage of change. As such, his goal is to continue with weight loss efforts. He has agreed to keeping a food journal and adhering to recommended  goals of 1400-1600 calories and 120+ grams protein.   Bring in journaling log to next OV.  Exercise goals:  As is  Behavioral modification strategies: keeping a strict food journal.  [name] has agreed to follow-up with our clinic in 3 weeks. He was informed of the importance of frequent follow-up visits to maximize his success with intensive lifestyle modifications for his multiple health conditions.   Objective:   Blood pressure 124/82, pulse 62, temperature 98.4 F (36.9 C), height 5' 9"  (1.753 m), weight 254 lb (115.2 kg), SpO2 97 %. Body mass index is 37.51 kg/m.  General: Cooperative, alert, well developed, in no acute distress. HEENT: Conjunctivae and lids unremarkable. Cardiovascular: Regular rhythm.  Lungs: Normal work of breathing. Neurologic: No focal deficits.   Lab Results  Component Value Date   CREATININE 1.07 04/23/2021   BUN 19 04/23/2021   NA 137 04/23/2021   K 3.8 04/23/2021   CL 103 04/23/2021   CO2 27 04/23/2021   Lab Results  Component Value Date   ALT 36 04/23/2021   AST 28 04/23/2021   ALKPHOS 70 04/23/2021   BILITOT 0.4 04/23/2021   Lab Results  Component Value Date   HGBA1C 5.4 04/23/2021   HGBA1C 5.5 07/10/2020   HGBA1C 5.9 03/22/2020   HGBA1C 6.2 07/02/2019   HGBA1C 5.8 11/13/2018   Lab Results  Component Value Date   INSULIN 14.0 07/10/2020   INSULIN 35.9 (H) 04/13/2020   Lab Results  Component Value Date   TSH 3.38 04/23/2021   Lab Results  Component Value Date   CHOL 171 04/23/2021   HDL 39.30 04/23/2021   LDLCALC 95 04/23/2021   LDLDIRECT 157.0 07/02/2019   TRIG 188.0 (H) 04/23/2021   CHOLHDL 4 04/23/2021   Lab Results  Component Value Date   VD25OH 31.57 04/23/2021   VD25OH 47.1 07/10/2020   VD25OH 33.4 04/13/2020   Lab Results  Component Value Date   WBC 4.8 04/23/2021   HGB 14.6 04/23/2021   HCT 42.7 04/23/2021   MCV 94.4 04/23/2021   PLT 143.0 (L) 04/23/2021    Attestation Statements:   Reviewed by  clinician on day of visit: allergies, medications, problem list, medical history, surgical history, family history, social history, and previous encounter notes.  I, Kathlene November, BS, CMA, am acting as transcriptionist for [employer], DO.   I have reviewed the above documentation for accuracy and completeness, and I agree with the above. Marjory Sneddon, D.O.  The [place] was signed into law in 2016 which includes the topic of electronic health records.  This provides immediate access to information in MyChart.  This includes consultation notes, operative notes, office notes, lab results and pathology reports.  If you have any questions about what you read please let us know at your next visit so we can discuss your concerns and take corrective action if need be.  We are right here with you.

## 2021-10-03 ENCOUNTER — Encounter (INDEPENDENT_AMBULATORY_CARE_PROVIDER_SITE_OTHER): Payer: Self-pay

## 2021-10-04 ENCOUNTER — Other Ambulatory Visit: Payer: Self-pay | Admitting: *Deleted

## 2021-10-04 ENCOUNTER — Other Ambulatory Visit (INDEPENDENT_AMBULATORY_CARE_PROVIDER_SITE_OTHER): Payer: Self-pay | Admitting: Family Medicine

## 2021-10-04 DIAGNOSIS — R7303 Prediabetes: Secondary | ICD-10-CM

## 2021-10-04 NOTE — Patient Outreach (Signed)
  Care Coordination   Initial Visit Note   10/04/2021 Name: Tony [name] MRN: [id number] [name]: 1975/10/09  Tony [name] is a 46 y.o. year old male who sees McGowen, Adrian Blackwater, MD for primary care. I spoke with  Tony [name] by phone today  What matters to the patients health and wellness today?  No needs    Goals Addressed               This Visit's Progress     No needs (pt-stated)        Care Coordination Interventions: Reviewed scheduled/upcoming provider appointments including all scheduled appointments. Screening for signs and symptoms of depression related to chronic disease state  Assessed social determinant of health barriers          SDOH assessments and interventions completed:  Yes  SDOH Interventions Today    Flowsheet Row Most Recent Value  SDOH Interventions   Food Insecurity Interventions Intervention Not Indicated  Housing Interventions Intervention Not Indicated        Care Coordination Interventions Activated:  Yes  Care Coordination Interventions:  Yes, provided   Follow up plan: No further intervention required.   Encounter Outcome:  Pt. Visit Completed   Tony Mina, RN Care Management Coordinator [place] Office [phone]

## 2021-10-04 NOTE — Patient Instructions (Signed)
Visit Information  Thank you for taking time to visit with me today. Please don't hesitate to contact me if I can be of assistance to you.   Following are the goals we discussed today:   Goals Addressed               This Visit's Progress     No needs (pt-stated)        Care Coordination Interventions: Reviewed scheduled/upcoming provider appointments including all scheduled appointments. Screening for signs and symptoms of depression related to chronic disease state  Assessed social determinant of health barriers           Please call the care guide team at [phone] if you need to cancel or reschedule your appointment.   If you are experiencing a Mental Health or [place] or need someone to talk to, please call the Suicide and Crisis Lifeline: 988  Patient verbalizes understanding of instructions and care plan provided today and agrees to view in [place]. Active MyChart status and patient understanding of how to access instructions and care plan via MyChart confirmed with patient.     No further follow up required: No further needs at this time.    Raina Mina, RN Care Management Coordinator [place] Office [phone]

## 2021-10-17 ENCOUNTER — Encounter (INDEPENDENT_AMBULATORY_CARE_PROVIDER_SITE_OTHER): Payer: Self-pay | Admitting: Family Medicine

## 2021-10-17 ENCOUNTER — Ambulatory Visit (INDEPENDENT_AMBULATORY_CARE_PROVIDER_SITE_OTHER): Payer: BC Managed Care – PPO | Admitting: Family Medicine

## 2021-10-17 VITALS — BP 114/77 | HR 68 | Temp 98.6°F | Ht 69.0 in | Wt 245.0 lb

## 2021-10-17 DIAGNOSIS — Z6836 Body mass index (BMI) 36.0-36.9, adult: Secondary | ICD-10-CM

## 2021-10-17 DIAGNOSIS — E669 Obesity, unspecified: Secondary | ICD-10-CM | POA: Diagnosis not present

## 2021-10-17 DIAGNOSIS — E1169 Type 2 diabetes mellitus with other specified complication: Secondary | ICD-10-CM

## 2021-10-17 DIAGNOSIS — E559 Vitamin D deficiency, unspecified: Secondary | ICD-10-CM | POA: Diagnosis not present

## 2021-10-17 DIAGNOSIS — K5909 Other constipation: Secondary | ICD-10-CM | POA: Diagnosis not present

## 2021-10-17 DIAGNOSIS — Z7984 Long term (current) use of oral hypoglycemic drugs: Secondary | ICD-10-CM

## 2021-10-17 DIAGNOSIS — Z9189 Other specified personal risk factors, not elsewhere classified: Secondary | ICD-10-CM

## 2021-10-17 DIAGNOSIS — K5900 Constipation, unspecified: Secondary | ICD-10-CM | POA: Insufficient documentation

## 2021-10-17 MED ORDER — VITAMIN D (ERGOCALCIFEROL) 1.25 MG (50000 UNIT) PO CAPS: ORAL_CAPSULE | ORAL | 0 refills | Status: DC

## 2021-10-17 MED ORDER — METFORMIN HCL 500 MG PO TABS: 500.0000 mg | ORAL_TABLET | Freq: Every day | ORAL | 0 refills | Status: DC

## 2021-10-17 MED ORDER — POLYETHYLENE GLYCOL 3350 17 GM/SCOOP PO POWD: ORAL | 1 refills | Status: DC

## 2021-10-22 NOTE — Progress Notes (Unsigned)
[place] [address] [place] Phone: [phone] Subjective:   [name], am serving as a scribe for Dr. Hulan Saas.   I'm seeing this patient by the request  of:  McGowen, Adrian Blackwater, MD  CC: Back pain follow-up  LHT:[id number]  REIGN BARTNICK is a 46 y.o. male coming in with complaint of back and neck pain. OMT on 09/04/2021. Patient states that his pain is getting worse in his lower back. Lifted some pavers on Wednesday that weighed 25# and woke up next day in pain. Patient laid in bed until Sunday where he was able to stand for about 5 mins. Was able to walk 1.5 miles yesterday. Pain today is 2/10.   Medications patient has been prescribed: gabapentin robaxin  Taking:         Reviewed prior external information including notes and imaging from previsou exam, outside providers and external EMR if available.   As well as notes that were available from care everywhere and other healthcare systems.  Past medical history, social, surgical and family history all reviewed in electronic medical record.  No pertanent information unless stated regarding to the chief complaint.   Past Medical History:  Diagnosis Date   Alcohol abuse 2016/2017   Pt states he was self medicating his anxiety.  Quit 04/2015.  Minimal intake as of 05/2017 f/u.   Anal fissure    Anxiety and depression    Back pain    Borderline diabetes    Erectile dysfunction    cialis 51m qd-qod started by urol 09/2020   Hepatic steatosis 2015; 2016; 02/2016   +Alcohol has played a role.  CT abd showed fatty liver+ liver enzymes mildly elevated (chron viral hepatitis screening NEG).  Abd u/s 03/26/16 showed fatty liver, small GB polyps, o/w normal.   History of kidney stones    [facility]   HTN (hypertension)    Hx of colonic polyp - ssp 02/11/2014   No polyps 02/2020->recall 10 yrs   Hyperlipidemia    Statin started 05/2018   Insomnia    Obesity, Class II,  BMI 35-39.9    Psoriasis    Secondary male hypogonadism    clomiphene 06/2019: normalized on clomiphene 08/2019   Shortness of breath on exertion     No Known Allergies   Review of Systems:  No headache, visual changes, nausea, vomiting, diarrhea, constipation, dizziness, abdominal pain, skin rash, fevers, chills, night sweats, weight loss, swollen lymph nodes, body aches, joint swelling, chest pain, shortness of breath, mood changes. POSITIVE muscle aches  Objective  Blood pressure 106/72, pulse 74, height 5' 9"  (1.753 m), weight 248 lb (112.5 kg), SpO2 94 %.   General: No apparent distress alert and oriented x3 mood and affect normal, dressed appropriately.  HEENT: Pupils equal, extraocular movements intact  Respiratory: Patient's speak in full sentences and does not appear short of breath  Cardiovascular: No lower extremity edema, non tender, no erythema  Gait MSK:  Back low back does have loss of lordosis.  Positive straight leg test on the right side.  Tenderness to palpation noted.  Patient has some difficulty going from a seated to standing position secondary to pain.  Does seem to have tightness noted of the hip flexors right greater than left as well.  Osteopathic findings  C2 flexed rotated and side bent right C6 flexed rotated and side bent left T3 extended rotated and side bent right inhaled rib T9 extended rotated  and side bent left Reported OMT on the lower back secondary to severity of pain       Assessment and Plan:  Degenerative disc disease, lumbar Patient's MRI showed Severe right and moderate left neural foraminal narrowing at the L4-5 level.  This is still consistent with patient's symptoms.  He does have some radicular symptoms and more of the L5 nerve distribution.  Patient does seem to respond a little bit but then the pain seems to come back.  Patient has had different spasms that last days and seem to be longer with increasing frequency.  We have tried  different medications over the course of time including multiple different muscle relaxers, gabapentin, meloxicam and vitamin D supplementation.  At this point I would like to refer patient for another epidural but at the same time discussed with the surgeon to see if surgical intervention would be more beneficial for this individual long-term.  Patient is in agreement with this.  Total time with patient today 36 minutes.    Nonallopathic problems  Decision today to treat with OMT was based on Physical Exam  After verbal consent patient was treated with HVLA, ME, FPR techniques in cervical, rib, thoracic, lareas of the did the lower back secondary to discomfort today.  Patient tolerated the procedure well with improvement in symptoms  Patient given exercises, stretches and lifestyle modifications  See medications in patient instructions if given  Patient will follow up in 4-8 weeks    The above documentation has been reviewed and is accurate and complete Lyndal Pulley, DO          Note: This dictation was prepared with Dragon dictation along with smaller phrase technology. Any transcriptional errors that result from this process are unintentional.

## 2021-10-23 ENCOUNTER — Encounter: Payer: Self-pay | Admitting: Family Medicine

## 2021-10-23 ENCOUNTER — Ambulatory Visit (INDEPENDENT_AMBULATORY_CARE_PROVIDER_SITE_OTHER): Payer: BC Managed Care – PPO | Admitting: Family Medicine

## 2021-10-23 VITALS — BP 106/72 | HR 74 | Ht 69.0 in | Wt 248.0 lb

## 2021-10-23 DIAGNOSIS — M5136 Other intervertebral disc degeneration, lumbar region: Secondary | ICD-10-CM

## 2021-10-23 DIAGNOSIS — M9902 Segmental and somatic dysfunction of thoracic region: Secondary | ICD-10-CM

## 2021-10-23 DIAGNOSIS — M9901 Segmental and somatic dysfunction of cervical region: Secondary | ICD-10-CM

## 2021-10-23 DIAGNOSIS — M9908 Segmental and somatic dysfunction of rib cage: Secondary | ICD-10-CM

## 2021-10-23 MED ORDER — PREDNISONE 50 MG PO TABS: ORAL_TABLET | ORAL | 0 refills | Status: DC

## 2021-10-23 NOTE — Patient Instructions (Addendum)
[place] [phone] Call Today  When we receive your results we will contact you.  Dr. Lynann Bologna referral placed Prednisone 82m for 5 days starting tomorrow

## 2021-10-23 NOTE — Assessment & Plan Note (Signed)
Patient's MRI showed Severe right and moderate left neural foraminal narrowing at the L4-5 level.  This is still consistent with patient's symptoms.  He does have some radicular symptoms and more of the L5 nerve distribution.  Patient does seem to respond a little bit but then the pain seems to come back.  Patient has had different spasms that last days and seem to be longer with increasing frequency.  We have tried different medications over the course of time including multiple different muscle relaxers, gabapentin, meloxicam and vitamin D supplementation.  At this point I would like to refer patient for another epidural but at the same time discussed with the surgeon to see if surgical intervention would be more beneficial for this individual long-term.  Patient is in agreement with this.  Total time with patient today 36 minutes.

## 2021-10-24 NOTE — Progress Notes (Signed)
Chief Complaint:   OBESITY Tony [name] is here to discuss his progress with his obesity treatment plan along with follow-up of his obesity related diagnoses. Tony [name] is on keeping a food journal and adhering to recommended goals of 1400-1600 calories and 120+ grams protein and states he is following his eating plan approximately 90% of the time. Tony [name] states he is walking and weight training 30-40 minutes 3-7 times per week.  Today's visit was #: 22 Starting weight: 270 lbs Starting date: 04/13/2020 Today's weight: 245 lbs Today's date: 10/17/2021 Total lbs lost to date: 25 Total lbs lost since last in-office visit: 9  Interim History: Pt focused on increasing protein intake the past several weeks. He denies hunger or cravings.  Subjective:   1. Type 2 diabetes mellitus with other specified complication, without long-term current use of insulin (HCC) Tony [name]'s A1c was 6.7 in 2017 but has been under great control with A1c less than 5.7.  2. Other constipation Pt reports having no bowel movement in 4 days. He is working outside and dehydration is likely a contributing factor.  3. Vitamin D deficiency He is currently taking prescription vitamin D 50,000 IU each week. He denies nausea, vomiting or muscle weakness.  4. At risk for dehydration Tony [name] is at risk for dehydration due to working outside and not hydrating well enough with the recent high temperatures.  Assessment/Plan:  No orders of the defined types were placed in this encounter.   Medications Discontinued During This Encounter  Medication Reason   Vitamin D, Ergocalciferol, (DRISDOL) 1.25 MG (50000 UNIT) CAPS capsule Reorder   metFORMIN (GLUCOPHAGE) 500 MG tablet Reorder     Meds ordered this encounter  Medications   Vitamin D, Ergocalciferol, (DRISDOL) 1.25 MG (50000 UNIT) CAPS capsule    Sig: TAKE ONE CAPSULE BY MOUTH EVERY 7 DAYS    Dispense:  4 capsule    Refill:  0   polyethylene glycol powder  (GLYCOLAX/MIRALAX) 17 GM/SCOOP powder    Sig: 17 gram twice daily until stooling regularly, then once daily and prn    Dispense:  3350 g    Refill:  1   metFORMIN (GLUCOPHAGE) 500 MG tablet    Sig: Take 1 tablet (500 mg total) by mouth daily with breakfast.    Dispense:  30 tablet    Refill:  0     1. Type 2 diabetes mellitus with other specified complication, without long-term current use of insulin (HCC) Good blood sugar control is important to decrease the likelihood of diabetic complications such as nephropathy, neuropathy, limb loss, blindness, coronary artery disease, and death. Intensive lifestyle modification including diet, exercise and weight loss are the first line of treatment for diabetes. Continue current treatment plan.  Refill- metFORMIN (GLUCOPHAGE) 500 MG tablet; Take 1 tablet (500 mg total) by mouth daily with breakfast.  Dispense: 30 tablet; Refill: 0  2. Other constipation Tony [name] was informed that a decrease in bowel movement frequency is normal while losing weight, but stools should not be hard or painful. Orders and follow up as documented in patient record. Start Miralax as per below.  Counseling Getting to Good Bowel Health: Your goal is to have one soft bowel movement each day. Drink at least 8 glasses of water each day. Eat plenty of fiber (goal is over 25 grams each day). It is best to get most of your fiber from dietary sources which includes leafy green vegetables, fresh fruit, and whole grains. You may need to add fiber with  the help of OTC fiber supplements. These include Metamucil, Citrucel, and Flaxseed. If you are still having trouble, try adding Miralax or Magnesium Citrate. If all of these changes do not work, Cabin [employer].  Start- polyethylene glycol powder (GLYCOLAX/MIRALAX) 17 GM/SCOOP powder; 17 gram twice daily until stooling regularly, then once daily and prn  Dispense: 3350 g; Refill: 1  3. Vitamin D deficiency Low Vitamin D level  contributes to fatigue and are associated with obesity, breast, and colon cancer. He agrees to continue to take prescription Vitamin D @50 ,000 IU every week and will follow-up for routine testing of Vitamin D, at least 2-3 times per year to avoid over-replacement.  Refill- Vitamin D, Ergocalciferol, (DRISDOL) 1.25 MG (50000 UNIT) CAPS capsule; TAKE ONE CAPSULE BY MOUTH EVERY 7 DAYS  Dispense: 4 capsule; Refill: 0  4. At risk for dehydration Tony [name] is at higher than average risk of dehydration.  Tony [name] was given more than 9 minutes of proper hydration counseling today.  We discussed the signs and symptoms of dehydration, some of which may include muscle cramping, constipation or even orthostatic symptoms.  Counseling on the prevention of dehydration was also provided today.  [name] is at risk for dehydration due to weight loss, lifestyle and behavorial habits and possibly due to taking certain medication(s).  He was encouraged to adequately hydrate and monitor fluid status to avoid dehydration as well as weight loss plateaus.  Unless pre-existing renal or cardiopulmonary conditions exist, in which patient was told to limit their fluid intake, I recommended roughly one half of their weight in pounds to be the approximate ounces of non-caloric, non-caffeinated beverages they should drink per day; including more if they are engaging in exercise.  Tony [name] is at higher than average risk of dehydration.  Tony [name] was given more than 9 minutes of proper hydration counseling today.  We discussed the signs and symptoms of dehydration, some of which may include muscle cramping, constipation, or even orthostatic symptoms.  Counseling on the prevention of dehydration was also provided today.  Tony [name] is at risk for dehydration due to weight loss, lifestyle and behavorial habits, and possibly due to taking certain medication(s).  He was encouraged to adequately hydrate and monitor fluid status to avoid dehydration as well as  weight loss plateaus.  Unless pre-existing renal or cardiopulmonary conditions exist, which pt was told to limit their fluid intake.  I recommended roughly one half of their weight in pounds to be the approximate ounces of non-caloric, non-caffeinated beverages they should drink per day; including more if they are engaging in exercise.  5. Obesity with current BMI of 36.3 Zack is currently in the action stage of change. As such, his goal is to continue with weight loss efforts. He has agreed to keeping a food journal and adhering to recommended goals of 1400-1600 calories and 120+ grams protein.   Pt has a primary care appointment on September 20th and will have fasting blood work. He declines labs until then.  Exercise goals:  As is  Behavioral modification strategies: planning for success.  Raynald has agreed to follow-up with our clinic in 3-4 weeks. He was informed of the importance of frequent follow-up visits to maximize his success with intensive lifestyle modifications for his multiple health conditions.   Objective:   Blood pressure 114/77, pulse 68, temperature 98.6 F (37 C), height 5' 9"  (1.753 m), weight 245 lb (111.1 kg), SpO2 97 %. Body mass index is 36.18 kg/m.  General: Cooperative, alert, well developed, in  no acute distress. HEENT: Conjunctivae and lids unremarkable. Cardiovascular: Regular rhythm.  Lungs: Normal work of breathing. Neurologic: No focal deficits.   Lab Results  Component Value Date   CREATININE 1.07 04/23/2021   BUN 19 04/23/2021   NA 137 04/23/2021   K 3.8 04/23/2021   CL 103 04/23/2021   CO2 27 04/23/2021   Lab Results  Component Value Date   ALT 36 04/23/2021   AST 28 04/23/2021   ALKPHOS 70 04/23/2021   BILITOT 0.4 04/23/2021   Lab Results  Component Value Date   HGBA1C 5.4 04/23/2021   HGBA1C 5.5 07/10/2020   HGBA1C 5.9 03/22/2020   HGBA1C 6.2 07/02/2019   HGBA1C 5.8 11/13/2018   Lab Results  Component Value Date   INSULIN  14.0 07/10/2020   INSULIN 35.9 (H) 04/13/2020   Lab Results  Component Value Date   TSH 3.38 04/23/2021   Lab Results  Component Value Date   CHOL 171 04/23/2021   HDL 39.30 04/23/2021   LDLCALC 95 04/23/2021   LDLDIRECT 157.0 07/02/2019   TRIG 188.0 (H) 04/23/2021   CHOLHDL 4 04/23/2021   Lab Results  Component Value Date   VD25OH 31.57 04/23/2021   VD25OH 47.1 07/10/2020   VD25OH 33.4 04/13/2020   Lab Results  Component Value Date   WBC 4.8 04/23/2021   HGB 14.6 04/23/2021   HCT 42.7 04/23/2021   MCV 94.4 04/23/2021   PLT 143.0 (L) 04/23/2021    Attestation Statements:   Reviewed by clinician on day of visit: allergies, medications, problem list, medical history, surgical history, family history, social history, and previous encounter notes.  I, Kathlene November, BS, CMA, am acting as transcriptionist for [employer], DO.   I have reviewed the above documentation for accuracy and completeness, and I agree with the above. Marjory Sneddon, D.O.  The [place] was signed into law in 2016 which includes the topic of electronic health records.  This provides immediate access to information in MyChart.  This includes consultation notes, operative notes, office notes, lab results and pathology reports.  If you have any questions about what you read please let us know at your next visit so we can discuss your concerns and take corrective action if need be.  We are right here with you.

## 2021-10-30 ENCOUNTER — Ambulatory Visit
Admission: RE | Admit: 2021-10-30 | Discharge: 2021-10-30 | Disposition: A | Discharge status: Home / Self Care | Payer: BC Managed Care – PPO | Source: Ambulatory Visit | Attending: Family Medicine | Admitting: Family Medicine

## 2021-10-30 DIAGNOSIS — M47817 Spondylosis without myelopathy or radiculopathy, lumbosacral region: Secondary | ICD-10-CM | POA: Diagnosis not present

## 2021-10-30 DIAGNOSIS — M5136 Other intervertebral disc degeneration, lumbar region: Secondary | ICD-10-CM

## 2021-10-30 MED ORDER — METHYLPREDNISOLONE ACETATE 40 MG/ML INJ SUSP (RADIOLOG
80.0000 mg | Freq: Once | INTRAMUSCULAR | Status: AC
  Administered 2021-10-30: 80 mg via EPIDURAL

## 2021-10-30 MED ORDER — IOPAMIDOL (ISOVUE-M 200) INJECTION 41%
1.0000 mL | Freq: Once | INTRAMUSCULAR | Status: AC
  Administered 2021-10-30: 1 mL via EPIDURAL

## 2021-10-30 NOTE — Discharge Instructions (Signed)

## 2021-11-09 ENCOUNTER — Encounter: Payer: Self-pay | Admitting: Family Medicine

## 2021-11-09 ENCOUNTER — Ambulatory Visit: Payer: BC Managed Care – PPO | Admitting: Family Medicine

## 2021-11-09 VITALS — BP 107/66 | HR 59 | Temp 97.6°F | Ht 69.0 in | Wt 247.4 lb

## 2021-11-09 DIAGNOSIS — E7800 Pure hypercholesterolemia, unspecified: Secondary | ICD-10-CM | POA: Diagnosis not present

## 2021-11-09 DIAGNOSIS — R7303 Prediabetes: Secondary | ICD-10-CM | POA: Diagnosis not present

## 2021-11-09 DIAGNOSIS — Z23 Encounter for immunization: Secondary | ICD-10-CM | POA: Diagnosis not present

## 2021-11-09 DIAGNOSIS — E559 Vitamin D deficiency, unspecified: Secondary | ICD-10-CM

## 2021-11-09 DIAGNOSIS — Z79899 Other long term (current) drug therapy: Secondary | ICD-10-CM | POA: Diagnosis not present

## 2021-11-09 DIAGNOSIS — F411 Generalized anxiety disorder: Secondary | ICD-10-CM

## 2021-11-09 DIAGNOSIS — F5104 Psychophysiologic insomnia: Secondary | ICD-10-CM

## 2021-11-09 LAB — COMPREHENSIVE METABOLIC PANEL
ALT: 23 U/L (ref 0–53)
AST: 22 U/L (ref 0–37)
Albumin: 4.4 g/dL (ref 3.5–5.2)
Alkaline Phosphatase: 66 U/L (ref 39–117)
BUN: 23 mg/dL (ref 6–23)
CO2: 28 mEq/L (ref 19–32)
Calcium: 9.2 mg/dL (ref 8.4–10.5)
Chloride: 101 mEq/L (ref 96–112)
Creatinine, Ser: 1.07 mg/dL (ref 0.40–1.50)
GFR: 83.95 mL/min (ref >60.00)
Glucose, Bld: 89 mg/dL (ref 70–99)
Potassium: 3.8 mEq/L (ref 3.5–5.1)
Sodium: 138 mEq/L (ref 135–145)
Total Bilirubin: 0.5 mg/dL (ref 0.2–1.2)
Total Protein: 7.1 g/dL (ref 6.0–8.3)

## 2021-11-09 LAB — LIPID PANEL
Cholesterol: 138 mg/dL (ref 0–200)
HDL: 41.1 mg/dL (ref >39.00)
LDL Cholesterol: 75 mg/dL (ref 0–99)
NonHDL: 97.08
Total CHOL/HDL Ratio: 3
Triglycerides: 108 mg/dL (ref 0.0–149.0)
VLDL: 21.6 mg/dL (ref 0.0–40.0)

## 2021-11-09 LAB — HEMOGLOBIN A1C: Hgb A1c MFr Bld: 5.6 % (ref 4.6–6.5)

## 2021-11-09 LAB — VITAMIN D 25 HYDROXY (VIT D DEFICIENCY, FRACTURES): VITD: 72.17 ng/mL (ref 30.00–100.00)

## 2021-11-09 MED ORDER — METHOCARBAMOL 750 MG PO TABS: 750.0000 mg | ORAL_TABLET | Freq: Four times a day (QID) | ORAL | 3 refills | Status: DC

## 2021-11-09 MED ORDER — ESZOPICLONE 3 MG PO TABS: ORAL_TABLET | ORAL | 1 refills | Status: DC

## 2021-11-09 MED ORDER — OMEPRAZOLE 40 MG PO CPDR
40.0000 mg | DELAYED_RELEASE_CAPSULE | Freq: Every day | ORAL | 3 refills | Status: DC
Start: 2021-11-09 — End: 2023-07-29

## 2021-11-09 MED ORDER — CLONAZEPAM 1 MG PO TABS
1.0000 mg | ORAL_TABLET | Freq: Three times a day (TID) | ORAL | 1 refills | Status: DC | PRN
Start: 2021-11-09 — End: 2022-06-10

## 2021-11-09 NOTE — Progress Notes (Signed)
OFFICE VISIT  11/09/2021  CC:  Chief Complaint  Patient presents with   Diabetes   Hypertension   Hyperlipidemia    Pt is fasting    Patient is a 46 y.o. male who presents for 69-monthfollow-up prediabetes, hyperlipidemia, anxiety, and insomnia. A/P as of last visit: "#1 GERD: He will try to avoid the foods that provokes this, but he eats plenty of [place] the main meat he has to resort to to try to limit fat. Start omeprazole 40 mg a day.   #2 hypertension.  Since losing weight he has been able to get off antihypertensive.   #3 insulin resistance/borderline diabetes: His A1c peaked back in 2017 at 6.7%. Working on [employer] followed by healthy weight and wellness clinic. He is on one half of 500 mg metformin twice a day. Hemoglobin AW1U lipids, metabolic panel, thyroid, and urine microalbumin/creatinine.   #4 hyperlipidemia. Rosuvastatin 5 mg a day. Lipid panel and hepatic panel today.   #5 anxiety and anxiety related insomnia. Lunesta 3 mg nightly works sometimes and sometimes not--she takes this a few times a week. We will continue this. His sertraline and carbamazepine are managed by his psychiatrist. I do prescribe him clonazepam 1 mg 3 times daily and we will continue this. Clonazepam 1 mg, 1 3 times daily as needed, #270, refill x1. Urine drug screen and controlled substance contract are up-to-date."  INTERIM HX: Feeling well. He is walking 3 miles every day and lifting weights 3 days a week.  He feels like he has been doing much better with his diet lately.  He uses clonazepam 3 times daily and it helps. He does take eszopiclone most nights. PMP AWARE reviewed today: most recent rx for clonazepam was filled 08/21/2021, #270, rx by me. Most recent prescription for eszopiclone was filled 07/11/2021, #90, prescription by me. No red flags.   Past Medical History:  Diagnosis Date   Alcohol abuse 2016/2017   Pt states he was self medicating his  anxiety.  Quit 04/2015.  Minimal intake as of 05/2017 f/u.   Anal fissure    Anxiety and depression    Back pain    Borderline diabetes    Erectile dysfunction    cialis [id number] started by urol 09/2020   Hepatic steatosis 2015; 2016; 02/2016   +Alcohol has played a role.  CT abd showed fatty liver+ liver enzymes mildly elevated (chron viral hepatitis screening NEG).  Abd u/s 03/26/16 showed fatty liver, small GB polyps, o/w normal.   History of kidney stones    [facility]   HTN (hypertension)    Hx of colonic polyp - ssp 02/11/2014   No polyps 02/2020->recall 10 yrs   Hyperlipidemia    Statin started 05/2018   Insomnia    Obesity, Class II, BMI 35-39.9    Psoriasis    Secondary male hypogonadism    clomiphene 06/2019: normalized on clomiphene 08/2019   Shortness of breath on exertion     Past Surgical History:  Procedure Laterality Date   APPENDECTOMY     COLONOSCOPY  03/10/2020   No polyps. Recall 2032.   COLONOSCOPY W/ BIOPSIES  02/03/14   Cecal polyp (sessile serrated polyp w/out dysplasia) and posterior anal fissure; repeat TCS 2020 per Dr. GeCarlean Purl HAND SURGERY Left 2001   broke left pinkie finger   LAPAROSCOPIC APPENDECTOMY N/A 02/24/2015   Procedure: APPENDECTOMY LAPAROSCOPIC;  Surgeon: DaAlphonsa OverallMD;  Location: [facility];  Service: General;  Laterality: N/A;  right ankle     2014    Outpatient Medications Prior to Visit  Medication Sig Dispense Refill   carbamazepine (TEGRETOL) 100 MG chewable tablet TAKE 1 TABLET BY MOUTH IN THE MORNING THEN TAKE 2 TABLETS BY MOUTH AT BEDTIME     clonazePAM (KLONOPIN) 1 MG tablet Take 1 tablet (1 mg total) by mouth 3 (three) times daily as needed for anxiety. 270 tablet 1   Eszopiclone 3 MG TABS TAKE ONE TABLET BY MOUTH EVERY NIGHT IMMEDIATELY BEFORE BEDTIME 90 tablet 1   fluticasone (FLONASE) 50 MCG/ACT nasal spray Place 2 sprays into both nostrils daily. 16 g 0   gabapentin (NEURONTIN) 300 MG capsule Take 1 capsule (300 mg  total) by mouth at bedtime. 90 capsule 0   meloxicam (MOBIC) 15 MG tablet Take 1 tablet (15 mg total) by mouth daily as needed for pain. 30 tablet 3   metFORMIN (GLUCOPHAGE) 500 MG tablet Take 1 tablet (500 mg total) by mouth daily with breakfast. 30 tablet 0   methocarbamol (ROBAXIN-750) 750 MG tablet Take 1 tablet (750 mg total) by mouth 4 (four) times daily. 30 tablet 3   omeprazole (PRILOSEC) 40 MG capsule Take 1 capsule (40 mg total) by mouth daily. 30 capsule 1   polyethylene glycol powder (GLYCOLAX/MIRALAX) 17 GM/SCOOP powder 17 gram twice daily until stooling regularly, then once daily and prn 3350 g 1   propranolol (INDERAL) 10 MG tablet Take 10 mg by mouth 2 (two) times daily.     rosuvastatin (CRESTOR) 5 MG tablet TAKE ONE TABLET BY MOUTH DAILY 90 tablet 3   sertraline (ZOLOFT) 25 MG tablet Take 12.5 mg by mouth daily.     Vitamin D, Ergocalciferol, (DRISDOL) 1.25 MG (50000 UNIT) CAPS capsule TAKE ONE CAPSULE BY MOUTH EVERY 7 DAYS 4 capsule 0   predniSONE (DELTASONE) 50 MG tablet Take one tablet daily for the next 5 days. (Patient not taking: Reported on 11/09/2021) 5 tablet 0   No facility-administered medications prior to visit.    No Known Allergies  ROS As per HPI  PE:    11/09/2021    8:55 AM 10/30/2021    8:30 AM 10/23/2021   12:42 PM  Vitals with BMI  Height 5' 9"   5' 9"   Weight 247 lbs 6 oz  248 lbs  BMI 67.12  45.80  Systolic 998 338 250  Diastolic 66 76 72  Pulse 59 78 74     Physical Exam  Gen: Alert, well appearing.  Patient is oriented to person, place, time, and situation. AFFECT: pleasant, lucid thought and speech. CV: RRR, no m/r/g.   LUNGS: CTA bilat, nonlabored resps, good aeration in all lung fields. EXT: no clubbing or cyanosis.  no edema.    LABS:  Last CBC Lab Results  Component Value Date   WBC 4.8 04/23/2021   HGB 14.6 04/23/2021   HCT 42.7 04/23/2021   MCV 94.4 04/23/2021   MCH 32.2 02/16/2018   RDW 12.2 04/23/2021   PLT 143.0 (L)  53/97/6734   Last metabolic panel Lab Results  Component Value Date   GLUCOSE 90 04/23/2021   NA 137 04/23/2021   K 3.8 04/23/2021   CL 103 04/23/2021   CO2 27 04/23/2021   BUN 19 04/23/2021   CREATININE 1.07 04/23/2021   GFRNONAA 99 04/13/2020   CALCIUM 9.0 04/23/2021   PROT 7.0 04/23/2021   ALBUMIN 4.5 04/23/2021   LABGLOB 2.9 04/13/2020   AGRATIO 1.5 04/13/2020   BILITOT 0.4 04/23/2021  ALKPHOS 70 04/23/2021   AST 28 04/23/2021   ALT 36 04/23/2021   ANIONGAP 11 02/16/2018   Last lipids Lab Results  Component Value Date   CHOL 171 04/23/2021   HDL 39.30 04/23/2021   LDLCALC 95 04/23/2021   LDLDIRECT 157.0 07/02/2019   TRIG 188.0 (H) 04/23/2021   CHOLHDL 4 04/23/2021   Last hemoglobin A1c Lab Results  Component Value Date   HGBA1C 5.4 04/23/2021   Last thyroid functions Lab Results  Component Value Date   TSH 3.38 04/23/2021   T3TOTAL 147 04/13/2020   T4TOTAL 5.4 04/13/2020   Last vitamin D Lab Results  Component Value Date   VD25OH 31.57 04/23/2021   Last vitamin B12 and Folate Lab Results  Component Value Date   [id number] 256 04/13/2020   FOLATE 11.8 04/13/2020   IMPRESSION AND PLAN:  #1 prediabetes.  Doing well on metformin 500 mg every morning. Fasting glucose and hemoglobin A1c today. Making improvements on diet and exercise.  #2 hyperlipidemia. Doing well on rosuvastatin 5 mg a day. Lipid panel and hepatic panel today.  3.  GAD.  Stable on Zoloft 12.5 mg a day and clonazepam 1 mg 3 times daily. Urine drug screen today.  Controlled substance contract updated.  #4 insomnia.  Does well with eszopiclone 3 mg nightly and takes this nearly every night.  #5 vitamin D deficiency.  He continues on 50,000 unit pill every week. Vitamin D level today.  An After Visit Summary was printed and given to the patient.  FOLLOW UP: No follow-ups on file.  Signed:  Crissie Sickles, MD           11/09/2021

## 2021-11-11 LAB — DRUG MONITORING PANEL 376104, URINE
Amphetamines: NEGATIVE ng/mL (ref <500)
Barbiturates: NEGATIVE ng/mL (ref <300)
Benzodiazepines: NEGATIVE ng/mL (ref <100)
Cocaine Metabolite: NEGATIVE ng/mL (ref <150)
Desmethyltramadol: NEGATIVE ng/mL (ref <100)
Opiates: NEGATIVE ng/mL (ref <100)
Oxycodone: NEGATIVE ng/mL (ref <100)
Tramadol: NEGATIVE ng/mL (ref <100)

## 2021-11-11 LAB — DM TEMPLATE

## 2021-11-14 ENCOUNTER — Encounter (INDEPENDENT_AMBULATORY_CARE_PROVIDER_SITE_OTHER): Payer: Self-pay | Admitting: Adult Health

## 2021-11-14 ENCOUNTER — Ambulatory Visit (INDEPENDENT_AMBULATORY_CARE_PROVIDER_SITE_OTHER): Payer: BC Managed Care – PPO | Admitting: Adult Health

## 2021-11-14 ENCOUNTER — Ambulatory Visit: Payer: BC Managed Care – PPO | Admitting: Family Medicine

## 2021-11-14 VITALS — BP 105/67 | HR 61 | Temp 98.2°F | Ht 69.0 in | Wt 242.0 lb

## 2021-11-14 DIAGNOSIS — E559 Vitamin D deficiency, unspecified: Secondary | ICD-10-CM | POA: Diagnosis not present

## 2021-11-14 DIAGNOSIS — E669 Obesity, unspecified: Secondary | ICD-10-CM

## 2021-11-14 DIAGNOSIS — F419 Anxiety disorder, unspecified: Secondary | ICD-10-CM

## 2021-11-14 DIAGNOSIS — E1169 Type 2 diabetes mellitus with other specified complication: Secondary | ICD-10-CM | POA: Diagnosis not present

## 2021-11-14 DIAGNOSIS — K5909 Other constipation: Secondary | ICD-10-CM | POA: Diagnosis not present

## 2021-11-14 DIAGNOSIS — Z6835 Body mass index (BMI) 35.0-35.9, adult: Secondary | ICD-10-CM

## 2021-11-14 DIAGNOSIS — Z7984 Long term (current) use of oral hypoglycemic drugs: Secondary | ICD-10-CM

## 2021-11-17 MED ORDER — METFORMIN HCL 500 MG PO TABS: 500.0000 mg | ORAL_TABLET | Freq: Every day | ORAL | 0 refills | Status: DC

## 2021-11-17 NOTE — Progress Notes (Unsigned)
Chief Complaint:   OBESITY Tony [name] is here to discuss his progress with his obesity treatment plan along with follow-up of his obesity related diagnoses. Tony [name] is on keeping a food journal and adhering to recommended goals of 1400-1600 calories and 120+ protein and states he is following his eating plan approximately 85% of the time. Tony [name] states he is walking, weights 60-130 minutes 4-7 times per week.  Today's visit was #: 23 Starting weight: 270 lbs Starting date: 04/13/2020 Today's weight: 242 lbs Today's date: 11/14/2021 Total lbs lost to date: 28 lbs Total lbs lost since last in-office visit: 3 lbs  Interim History: 11/09/2021, office visit with PCP with labs. He has been tracking intake 85% with keeping calorie intake <1600 calories per day.  He eats a lot of [place] and estimates ***  Subjective:   1. Vitamin D deficiency  Discussed labs with patient today. 11/09/2021, Vitamin D level 72.  He is on ergocalciferol, managed by [facility].    2. Type 2 diabetes mellitus with other specified complication, without long-term current use of insulin ([place]) Discussed labs with patient today. A1c ***.   He is on metformin 500 mg at breakfast.    3. Other constipation He is having a bowel movement every 4-5 days.  He denies abdominal pain or hematochezia.  He denies family history of colon cancer.  Colonoscopy was normal prior to [facility] and he should increase protein.  Daily ***   4. Anxiety He reports dizziness with position changes.  Blood pressure has been trending with weight loss.  Psychiatrist provided propranolol 10 mg BID for anxiety.    Assessment/Plan:   1. Vitamin D deficiency Continue weekly Vitamin D.   2. Type 2 diabetes mellitus with other specified complication, without long-term current use of insulin (HCC) Continue healthy eating and regular exercises.   Refill - metFORMIN (GLUCOPHAGE) 500 MG tablet; Take 1 tablet (500 mg total) by mouth daily with breakfast.   Dispense: 30 tablet; Refill: 0  3. Other constipation Continue Miralax, add in smooth move tea.   4. Anxiety Follow up with psychiatrist.   5. Obesity with current BMI of 35.8 Tony [name] is currently in the action stage of change. As such, his goal is to continue with weight loss efforts. He has agreed to keeping a food journal and adhering to recommended goals of 1400-1600 calories and 120 protein daily.   Exercise goals:  as is.   Behavioral modification strategies: increasing lean protein intake, decreasing simple carbohydrates, meal planning and cooking strategies, keeping healthy foods in the home, travel eating strategies, and planning for success.  Tony [name] has agreed to follow-up with our clinic in 4 weeks. He was informed of the importance of frequent follow-up visits to maximize his success with intensive lifestyle modifications for his multiple health conditions.   Objective:   Blood pressure 105/67, pulse 61, temperature 98.2 F (36.8 C), height 5' 9"  (1.753 m), weight 242 lb (109.8 kg), SpO2 97 %. Body mass index is 35.74 kg/m.  General: Cooperative, alert, well developed, in no acute distress. HEENT: Conjunctivae and lids unremarkable. Cardiovascular: Regular rhythm.  Lungs: Normal work of breathing. Neurologic: No focal deficits.   Lab Results  Component Value Date   CREATININE 1.07 11/09/2021   BUN 23 11/09/2021   NA 138 11/09/2021   K 3.8 11/09/2021   CL 101 11/09/2021   CO2 28 11/09/2021   Lab Results  Component Value Date   ALT 23 11/09/2021   AST 22 11/09/2021  ALKPHOS 66 11/09/2021   BILITOT 0.5 11/09/2021   Lab Results  Component Value Date   HGBA1C 5.6 11/09/2021   HGBA1C 5.4 04/23/2021   HGBA1C 5.5 07/10/2020   HGBA1C 5.9 03/22/2020   HGBA1C 6.2 07/02/2019   Lab Results  Component Value Date   INSULIN 14.0 07/10/2020   INSULIN 35.9 (H) 04/13/2020   Lab Results  Component Value Date   TSH 3.38 04/23/2021   Lab Results  Component Value  Date   CHOL 138 11/09/2021   HDL 41.10 11/09/2021   LDLCALC 75 11/09/2021   LDLDIRECT 157.0 07/02/2019   TRIG 108.0 11/09/2021   CHOLHDL 3 11/09/2021   Lab Results  Component Value Date   VD25OH 72.17 11/09/2021   VD25OH 31.57 04/23/2021   VD25OH 47.1 07/10/2020   Lab Results  Component Value Date   WBC 4.8 04/23/2021   HGB 14.6 04/23/2021   HCT 42.7 04/23/2021   MCV 94.4 04/23/2021   PLT 143.0 (L) 04/23/2021   No results found for: "IRON", "TIBC", "FERRITIN"  Attestation Statements:   Reviewed by clinician on day of visit: allergies, medications, problem list, medical history, surgical history, family history, social history, and previous encounter notes.  I, Davy Pique, RMA, am acting as Location manager for Mina Marble, NP.  I have reviewed the above documentation for accuracy and completeness, and I agree with the above. -  ***

## 2021-11-28 DIAGNOSIS — F410 Panic disorder [episodic paroxysmal anxiety] without agoraphobia: Secondary | ICD-10-CM | POA: Diagnosis not present

## 2021-11-28 DIAGNOSIS — F411 Generalized anxiety disorder: Secondary | ICD-10-CM | POA: Diagnosis not present

## 2021-12-03 DIAGNOSIS — M4696 Unspecified inflammatory spondylopathy, lumbar region: Secondary | ICD-10-CM | POA: Diagnosis not present

## 2021-12-12 ENCOUNTER — Encounter (INDEPENDENT_AMBULATORY_CARE_PROVIDER_SITE_OTHER): Payer: Self-pay | Admitting: Adult Health

## 2021-12-12 ENCOUNTER — Ambulatory Visit (INDEPENDENT_AMBULATORY_CARE_PROVIDER_SITE_OTHER): Payer: BC Managed Care – PPO | Admitting: Adult Health

## 2021-12-12 VITALS — BP 120/79 | HR 70 | Temp 98.3°F | Ht 69.0 in | Wt 238.0 lb

## 2021-12-12 DIAGNOSIS — E1169 Type 2 diabetes mellitus with other specified complication: Secondary | ICD-10-CM

## 2021-12-12 DIAGNOSIS — E7849 Other hyperlipidemia: Secondary | ICD-10-CM | POA: Diagnosis not present

## 2021-12-12 DIAGNOSIS — E669 Obesity, unspecified: Secondary | ICD-10-CM

## 2021-12-12 DIAGNOSIS — E782 Mixed hyperlipidemia: Secondary | ICD-10-CM

## 2021-12-12 DIAGNOSIS — Z7984 Long term (current) use of oral hypoglycemic drugs: Secondary | ICD-10-CM

## 2021-12-12 DIAGNOSIS — Z6835 Body mass index (BMI) 35.0-35.9, adult: Secondary | ICD-10-CM

## 2021-12-12 MED ORDER — METFORMIN HCL 500 MG PO TABS: 500.0000 mg | ORAL_TABLET | Freq: Every day | ORAL | 0 refills | Status: DC

## 2021-12-14 ENCOUNTER — Other Ambulatory Visit: Payer: Self-pay | Admitting: Family Medicine

## 2021-12-14 NOTE — Progress Notes (Unsigned)
[place] Phone: [phone] Subjective:   Tony Tony [name], am serving as a scribe for Dr. Hulan Saas.  I'm seeing this patient by the request  of:  Tony [name], Tony Blackwater, MD  CC: Neck and back pain follow-up  MBW:[id number]  Tony Tony [name] is a 46 y.o. male coming in with complaint of back and neck pain. OMT on 10/23/2021. Patient states that his back has been doing ok. Patient golfed without pain but he is on prednisone. Patient saw Dr. Lynann Bologna and referred to Dr. Jacelyn Grip and will do injections.   Medications patient has been prescribed: Gabapentin  Taking:         Reviewed prior external information including notes and imaging from previsou exam, outside providers and external EMR if available.   As well as notes that were available from care everywhere and other healthcare systems.  Past medical history, social, surgical and family history all reviewed in electronic medical record.  Tony [name] pertanent information unless stated regarding to the chief complaint.   Past Medical History:  Diagnosis Date   Alcohol abuse 2016/2017   Pt states he was self medicating his anxiety.  Quit 04/2015.  Minimal intake as of 05/2017 f/u.   Anal fissure    Anxiety and depression    Back pain    Borderline diabetes    Erectile dysfunction    cialis 62m qd-qod started by urol 09/2020   Hepatic steatosis 2015; 2016; 02/2016   +Alcohol has played a role.  CT abd showed fatty liver+ liver enzymes mildly elevated (chron viral hepatitis screening NEG).  Abd u/s 03/26/16 showed fatty liver, small GB [name], o/w normal.   History of kidney stones    [facility]   HTN (hypertension)    Hx of colonic polyp - ssp 02/11/2014   Tony Tony [name] 02/2020->recall 10 yrs   Hyperlipidemia    Statin started 05/2018   Insomnia    Obesity, Class II, BMI 35-39.9    Psoriasis    Secondary male hypogonadism    clomiphene 06/2019: normalized on clomiphene  08/2019   Shortness of breath on exertion     Tony [name] Known Allergies   Review of Systems:  Tony [name] headache, visual changes, nausea, vomiting, diarrhea, constipation, dizziness, abdominal pain, skin rash, fevers, chills, night sweats, weight loss, swollen lymph nodes, body aches, joint swelling, chest pain, shortness of breath, mood changes. POSITIVE muscle aches  Objective  Blood pressure 124/84, pulse 78, height 5' 9"  (1.753 m), weight 248 lb (112.5 kg), SpO2 98 %.   General: Tony [name] apparent distress alert and oriented x3 mood and affect normal, dressed appropriately.  HEENT: Pupils equal, extraocular movements intact  Respiratory: Patient's speak in full sentences and does not appear short of breath  Cardiovascular: Tony [name] lower extremity edema, non tender, Tony [name] erythema  Gait MSK:  Back loss of lordosis tightness noted over the SI joint  Lipoma noted, positive FABER    Osteopathic findings   T5 extended rotated and side bent right inhaled rib T7 extended rotated and side bent left L2 flexed rotated and side bent right Sacrum right on right       Assessment and Plan:  Degenerative disc disease, lumbar Chronic with exacerbation  Still seeing other providers and is likely going to undergo a medial branch block with a questionable radiofrequency ablation.  I am optimistic patient should hopefully respond well to this.  Patient does respond well to osteopathic manipulation.  We  discussed medications including gabapentin 300 mg at nighttime that may need to increase to 400 if he continues to have difficulty.  Has meloxicam 15 mg as needed.  Methocarbamol as well for breakthrough.  Patient will continue work on core strengthening.  Follow-up again in 6 to 8 weeks    Nonallopathic problems  Decision today to treat with OMT was based on Physical Exam  After verbal consent patient was treated with HVLA, ME, FPR techniques in  rib, thoracic, lumbar, and sacral  areas  Patient tolerated the  procedure well with improvement in symptoms  Patient given exercises, stretches and lifestyle modifications  See medications in patient instructions if given  Patient will follow up in 4-8 weeks    The above documentation has been reviewed and is accurate and complete Tony Pulley, DO          Note: This dictation was prepared with Dragon dictation along with smaller phrase technology. Any transcriptional errors that result from this process are unintentional.

## 2021-12-18 ENCOUNTER — Ambulatory Visit: Payer: BC Managed Care – PPO | Admitting: Family Medicine

## 2021-12-18 ENCOUNTER — Encounter: Payer: Self-pay | Admitting: Family Medicine

## 2021-12-18 VITALS — BP 124/84 | HR 78 | Ht 69.0 in | Wt 248.0 lb

## 2021-12-18 DIAGNOSIS — M9902 Segmental and somatic dysfunction of thoracic region: Secondary | ICD-10-CM | POA: Diagnosis not present

## 2021-12-18 DIAGNOSIS — M9904 Segmental and somatic dysfunction of sacral region: Secondary | ICD-10-CM | POA: Diagnosis not present

## 2021-12-18 DIAGNOSIS — M9903 Segmental and somatic dysfunction of lumbar region: Secondary | ICD-10-CM

## 2021-12-18 DIAGNOSIS — M5136 Other intervertebral disc degeneration, lumbar region: Secondary | ICD-10-CM

## 2021-12-18 NOTE — Assessment & Plan Note (Signed)
Chronic with exacerbation  Still seeing other providers and is likely going to undergo a medial branch block with a questionable radiofrequency ablation.  I am optimistic patient should hopefully respond well to this.  Patient does respond well to osteopathic manipulation.  We discussed medications including gabapentin 300 mg at nighttime that may need to increase to 400 if he continues to have difficulty.  Has meloxicam 15 mg as needed.  Methocarbamol as well for breakthrough.  Patient will continue work on core strengthening.  Follow-up again in 6 to 8 weeks

## 2021-12-18 NOTE — Patient Instructions (Signed)
Good to see you Let's see if Vikki Ports can make it See me in 7-8 weeks

## 2021-12-19 DIAGNOSIS — E7849 Other hyperlipidemia: Secondary | ICD-10-CM | POA: Insufficient documentation

## 2021-12-19 NOTE — Progress Notes (Unsigned)
Chief Complaint:   OBESITY Tony [name] is here to discuss his progress with his obesity treatment plan along with follow-up of his obesity related diagnoses. Tony [name] is on {MWMwtlossportion/plan2:23431} and states he is following his eating plan approximately ***% of the time. Tony [name] states he is *** *** minutes *** times per week.  Today's visit was #: *** Starting weight: *** Starting date: *** Today's weight: *** Today's date: 12/19/2021 Total lbs lost to date: *** Total lbs lost since last in-office visit: ***  Interim History: ***  Subjective:   1. Other hyperlipidemia ***  2. Type 2 diabetes mellitus with other specified complication, without long-term current use of insulin (HCC) ***  3. Obesity with current BMI of 35.2 ***   Assessment/Plan:   1. Other hyperlipidemia ***  2. Type 2 diabetes mellitus with other specified complication, without long-term current use of insulin (HCC) *** - metFORMIN (GLUCOPHAGE) 500 MG tablet; Take 1 tablet (500 mg total) by mouth daily with breakfast.  Dispense: 30 tablet; Refill: 0  3. Obesity with current BMI of 35.2 ***  Tony [name] {CHL AMB IS/IS NOT:210130109} currently in the action stage of change. As such, his goal is to {MWMwtloss#1:210800005}. He has agreed to {MWMwtlossportion/plan2:23431}.   Exercise goals: {MWM EXERCISE RECS:23473}  Behavioral modification strategies: {MWMwtlossdietstrategies3:23432}.  Tony [name] has agreed to follow-up with our clinic in {NUMBER 1-10:22536} weeks. He was informed of the importance of frequent follow-up visits to maximize his success with intensive lifestyle modifications for his multiple health conditions.   ***delete paragraph if no labs orderedRobert was informed we would discuss his lab results at his next visit unless there is a critical issue that needs to be addressed sooner. Tony [name] agreed to keep his next visit at the agreed upon time to discuss these results.  Objective:   Blood  pressure 120/79, pulse 70, temperature 98.3 F (36.8 C), height 5' 9"  (1.753 m), weight 238 lb (108 kg), SpO2 98 %. Body mass index is 35.15 kg/m.  General: Cooperative, alert, well developed, in no acute distress. HEENT: Conjunctivae and lids unremarkable. Cardiovascular: Regular rhythm.  Lungs: Normal work of breathing. Neurologic: No focal deficits.   Lab Results  Component Value Date   CREATININE 1.07 11/09/2021   BUN 23 11/09/2021   NA 138 11/09/2021   K 3.8 11/09/2021   CL 101 11/09/2021   CO2 28 11/09/2021   Lab Results  Component Value Date   ALT 23 11/09/2021   AST 22 11/09/2021   ALKPHOS 66 11/09/2021   BILITOT 0.5 11/09/2021   Lab Results  Component Value Date   HGBA1C 5.6 11/09/2021   HGBA1C 5.4 04/23/2021   HGBA1C 5.5 07/10/2020   HGBA1C 5.9 03/22/2020   HGBA1C 6.2 07/02/2019   Lab Results  Component Value Date   INSULIN 14.0 07/10/2020   INSULIN 35.9 (H) 04/13/2020   Lab Results  Component Value Date   TSH 3.38 04/23/2021   Lab Results  Component Value Date   CHOL 138 11/09/2021   HDL 41.10 11/09/2021   LDLCALC 75 11/09/2021   LDLDIRECT 157.0 07/02/2019   TRIG 108.0 11/09/2021   CHOLHDL 3 11/09/2021   Lab Results  Component Value Date   VD25OH 72.17 11/09/2021   VD25OH 31.57 04/23/2021   VD25OH 47.1 07/10/2020   Lab Results  Component Value Date   WBC 4.8 04/23/2021   HGB 14.6 04/23/2021   HCT 42.7 04/23/2021   MCV 94.4 04/23/2021   PLT 143.0 (L) 04/23/2021   No results found  for: "IRON", "TIBC", "FERRITIN"  Obesity Behavioral Intervention:   Approximately 15 minutes were spent on the discussion below.  ASK: We discussed the diagnosis of obesity with Tony [name] today and Tony [name] agreed to give [place] permission to discuss obesity behavioral modification therapy today.  ASSESS: Tony [name] has the diagnosis of obesity and his BMI today is ***. Tony [name] {ACTION; IS/IS YIA:[id number]} in the action stage of change.   ADVISE: Tony [name] was educated  on the multiple health risks of obesity as well as the benefit of weight loss to improve his health. He was advised of the need for long term treatment and the importance of lifestyle modifications to improve his current health and to decrease his risk of future health problems.  AGREE: Multiple dietary modification options and treatment options were discussed and Tony [name] agreed to follow the recommendations documented in the above note.  ARRANGE: Tony [name] was educated on the importance of frequent visits to treat obesity as outlined per CMS and USPSTF guidelines and agreed to schedule his next follow up appointment today.  Attestation Statements:   Reviewed by clinician on day of visit: allergies, medications, problem list, medical history, surgical history, family history, social history, and previous encounter notes.  ***(delete if time-based billing not used)Time spent on visit including pre-visit chart review and post-visit care and charting was *** minutes.   I, ***, am acting as transcriptionist for ***.  I have reviewed the above documentation for accuracy and completeness, and I agree with the above. -  ***

## 2021-12-24 DIAGNOSIS — M47816 Spondylosis without myelopathy or radiculopathy, lumbar region: Secondary | ICD-10-CM | POA: Diagnosis not present

## 2021-12-28 ENCOUNTER — Other Ambulatory Visit (INDEPENDENT_AMBULATORY_CARE_PROVIDER_SITE_OTHER): Payer: Self-pay | Admitting: Family Medicine

## 2021-12-28 DIAGNOSIS — E559 Vitamin D deficiency, unspecified: Secondary | ICD-10-CM

## 2022-01-02 ENCOUNTER — Ambulatory Visit (INDEPENDENT_AMBULATORY_CARE_PROVIDER_SITE_OTHER): Payer: BC Managed Care – PPO | Admitting: Family Medicine

## 2022-01-05 ENCOUNTER — Other Ambulatory Visit (INDEPENDENT_AMBULATORY_CARE_PROVIDER_SITE_OTHER): Payer: Self-pay | Admitting: Family Medicine

## 2022-01-05 DIAGNOSIS — E559 Vitamin D deficiency, unspecified: Secondary | ICD-10-CM

## 2022-01-08 ENCOUNTER — Ambulatory Visit (INDEPENDENT_AMBULATORY_CARE_PROVIDER_SITE_OTHER): Payer: BC Managed Care – PPO | Admitting: Family Medicine

## 2022-01-10 ENCOUNTER — Encounter (INDEPENDENT_AMBULATORY_CARE_PROVIDER_SITE_OTHER): Payer: Self-pay | Admitting: Family Medicine

## 2022-01-10 ENCOUNTER — Other Ambulatory Visit: Payer: Self-pay | Admitting: Family Medicine

## 2022-01-10 ENCOUNTER — Other Ambulatory Visit (INDEPENDENT_AMBULATORY_CARE_PROVIDER_SITE_OTHER): Payer: Self-pay | Admitting: Family Medicine

## 2022-01-10 DIAGNOSIS — E559 Vitamin D deficiency, unspecified: Secondary | ICD-10-CM

## 2022-01-10 NOTE — Telephone Encounter (Signed)
This has been prescribed and refilled by his psychiatrist in the past.   I have never filled it for him.   Please verify that he is still seeing his psychiatrist.

## 2022-01-10 NOTE — Telephone Encounter (Signed)
LAST APPOINTMENT DATE: 12/12/2021 NEXT APPOINTMENT DATE: 01/08/2022   [name] [id number] [place] [address] Phone: [phone] Fax: [phone]  [id number] [place] [place] [place]Phone: [phone]: [phone] Patient is requesting a refill of the following medications: Requested Prescriptions    No prescriptions requested or ordered in this encounter    Date last filled: 10/17/2021 Previously prescribed by [facility]  Lab Results  Component Value Date   HGBA1C 5.6 11/09/2021   HGBA1C 5.4 04/23/2021   HGBA1C 5.5 07/10/2020   Lab Results  Component Value Date   MICROALBUR <0.7 04/23/2021   [place]75 11/09/2021   CREATININE 1.07 11/09/2021   Lab Results  Component Value Date   VD25OH 72.17 11/09/2021   VD25OH 31.57 04/23/2021   VD25OH 47.1 07/10/2020    BP Readings from Last 3 Encounters:  12/18/21 124/84  12/12/21 120/79  11/14/21 105/67

## 2022-01-10 NOTE — Telephone Encounter (Signed)
RF request for Tegretol LOV: 12/12/21 Next ov: 05/10/22 Last written: historical provider 05/22/18  Please fill, if appropriate.

## 2022-01-14 ENCOUNTER — Encounter: Payer: Self-pay | Admitting: Family Medicine

## 2022-01-15 ENCOUNTER — Telehealth (INDEPENDENT_AMBULATORY_CARE_PROVIDER_SITE_OTHER): Payer: Self-pay | Admitting: Family Medicine

## 2022-01-15 ENCOUNTER — Encounter (INDEPENDENT_AMBULATORY_CARE_PROVIDER_SITE_OTHER): Payer: Self-pay

## 2022-01-15 NOTE — Telephone Encounter (Signed)
Left message for pt to make an office visit.

## 2022-01-15 NOTE — Telephone Encounter (Signed)
Pt called 11/21 needing refill for Vitamin D

## 2022-01-16 ENCOUNTER — Other Ambulatory Visit (INDEPENDENT_AMBULATORY_CARE_PROVIDER_SITE_OTHER): Payer: Self-pay

## 2022-01-16 ENCOUNTER — Encounter (INDEPENDENT_AMBULATORY_CARE_PROVIDER_SITE_OTHER): Payer: Self-pay

## 2022-01-16 ENCOUNTER — Telehealth: Payer: Self-pay | Admitting: Family Medicine

## 2022-01-16 DIAGNOSIS — E559 Vitamin D deficiency, unspecified: Secondary | ICD-10-CM

## 2022-01-16 MED ORDER — VITAMIN D (ERGOCALCIFEROL) 1.25 MG (50000 UNIT) PO CAPS: ORAL_CAPSULE | ORAL | 0 refills | Status: DC

## 2022-01-16 NOTE — Telephone Encounter (Signed)
Patient called stating that he was scheduled to have a procedure with [facility] yesterday but it was denied due to not having enough information.  I sent over OV notes to [facility] to try to help but they were not able to get it approved before the appointment. He said that at this point they would need a peer to peer. He asked if this is something Dr Tamala Julian would do for him? I told him that I thought it would be best for [facility] to do being that they are the ones working on the authorization.  Any thoughts?

## 2022-01-21 NOTE — Telephone Encounter (Signed)
Called patient and left him know.

## 2022-01-30 NOTE — Progress Notes (Deleted)
[place] Phone: [phone] Subjective:    I'm seeing this patient by the request  of:  McGowen, Adrian Blackwater, MD  CC:   [id number]  Tony [name] is a 46 y.o. male coming in with complaint of back and neck pain. OMT 12/18/2021. Patient states   Medications patient has been prescribed: Gabapentin  Taking:         Reviewed prior external information including notes and imaging from previsou exam, outside providers and external EMR if available.   As well as notes that were available from care everywhere and other healthcare systems.  Past medical history, social, surgical and family history all reviewed in electronic medical record.  No pertanent information unless stated regarding to the chief complaint.   Past Medical History:  Diagnosis Date   Alcohol abuse 2016/2017   Pt states he was self medicating his anxiety.  Quit 04/2015.  Minimal intake as of 05/2017 f/u.   Anal fissure    Anxiety and depression    Back pain    Borderline diabetes    Erectile dysfunction    cialis 50m qd-qod started by urol 09/2020   Hepatic steatosis 2015; 2016; 02/2016   +Alcohol has played a role.  CT abd showed fatty liver+ liver enzymes mildly elevated (chron viral hepatitis screening NEG).  Abd u/s 03/26/16 showed fatty liver, small GB polyps, o/w normal.   History of kidney stones    [facility]   HTN (hypertension)    Hx of colonic polyp - ssp 02/11/2014   No polyps 02/2020->recall 10 yrs   Hyperlipidemia    Statin started 05/2018   Insomnia    Obesity, Class II, BMI 35-39.9    Psoriasis    Secondary male hypogonadism    clomiphene 06/2019: normalized on clomiphene 08/2019   Shortness of breath on exertion     No Known Allergies   Review of Systems:  No headache, visual changes, nausea, vomiting, diarrhea, constipation, dizziness, abdominal pain, skin rash, fevers, chills, night sweats, weight loss, swollen  lymph nodes, body aches, joint swelling, chest pain, shortness of breath, mood changes. POSITIVE muscle aches  Objective  There were no vitals taken for this visit.   General: No apparent distress alert and oriented x3 mood and affect normal, dressed appropriately.  HEENT: Pupils equal, extraocular movements intact  Respiratory: Patient's speak in full sentences and does not appear short of breath  Cardiovascular: No lower extremity edema, non tender, no erythema  Gait MSK:  Back   Osteopathic findings  C2 flexed rotated and side bent right C6 flexed rotated and side bent left T3 extended rotated and side bent right inhaled rib T9 extended rotated and side bent left L2 flexed rotated and side bent right Sacrum right on right       Assessment and Plan:  No problem-specific Assessment & Plan notes found for this encounter.    Nonallopathic problems  Decision today to treat with OMT was based on Physical Exam  After verbal consent patient was treated with HVLA, ME, FPR techniques in cervical, rib, thoracic, lumbar, and sacral  areas  Patient tolerated the procedure well with improvement in symptoms  Patient given exercises, stretches and lifestyle modifications  See medications in patient instructions if given  Patient will follow up in 4-8 weeks             Note: This dictation was prepared with Dragon dictation along with smaller phrase  technology. Any transcriptional errors that result from this process are unintentional.

## 2022-02-05 ENCOUNTER — Ambulatory Visit: Payer: BC Managed Care – PPO | Admitting: Family Medicine

## 2022-02-06 ENCOUNTER — Ambulatory Visit (INDEPENDENT_AMBULATORY_CARE_PROVIDER_SITE_OTHER): Payer: BC Managed Care – PPO | Admitting: Family Medicine

## 2022-02-08 ENCOUNTER — Telehealth: Payer: Self-pay

## 2022-02-08 ENCOUNTER — Telehealth: Payer: BC Managed Care – PPO | Admitting: Physician Assistant

## 2022-02-08 ENCOUNTER — Telehealth: Payer: BC Managed Care – PPO | Admitting: Family Medicine

## 2022-02-08 DIAGNOSIS — J069 Acute upper respiratory infection, unspecified: Secondary | ICD-10-CM

## 2022-02-08 MED ORDER — PSEUDOEPH-BROMPHEN-DM 30-2-10 MG/5ML PO SYRP: 5.0000 mL | ORAL_SOLUTION | Freq: Four times a day (QID) | ORAL | 0 refills | Status: DC | PRN

## 2022-02-08 NOTE — Progress Notes (Signed)
Duplicate apptmt - seen at 545 pm-_DWB

## 2022-02-08 NOTE — Patient Instructions (Signed)
Tony [name], thank you for joining [place], PA-C for today's virtual visit.  While this provider is not your primary care provider (PCP), if your PCP is located in our provider database this encounter information will be shared with them immediately following your visit.   [facility] account gives you access to today's visit and all your visits, tests, and labs performed at [facility] " click here if you don't have a [facility] account or go to [email or web address]  Consent: (Patient) Tony [name] provided verbal consent for this virtual visit at the beginning of the encounter.  Current Medications:  Current Outpatient Medications:    brompheniramine-pseudoephedrine-DM 30-2-10 MG/5ML syrup, Take 5 mLs by mouth 4 (four) times daily as needed., Disp: 120 mL, Rfl: 0   carbamazepine (TEGRETOL) 100 MG chewable tablet, TAKE 1 TABLET BY MOUTH IN THE MORNING THEN TAKE 2 TABLETS BY MOUTH AT BEDTIME, Disp: , Rfl:    clonazePAM (KLONOPIN) 1 MG tablet, Take 1 tablet (1 mg total) by mouth 3 (three) times daily as needed for anxiety., Disp: 270 tablet, Rfl: 1   Eszopiclone 3 MG TABS, TAKE ONE TABLET BY MOUTH EVERY NIGHT IMMEDIATELY BEFORE BEDTIME, Disp: 90 tablet, Rfl: 1   fluticasone (FLONASE) 50 MCG/ACT nasal spray, Place 2 sprays into both nostrils daily., Disp: 16 g, Rfl: 0   gabapentin (NEURONTIN) 300 MG capsule, TAKE 1 CAPSULE BY MOUTH AT BEDTIME, Disp: 90 capsule, Rfl: 0   meloxicam (MOBIC) 15 MG tablet, Take 1 tablet (15 mg total) by mouth daily as needed for pain., Disp: 30 tablet, Rfl: 3   metFORMIN (GLUCOPHAGE) 500 MG tablet, Take 1 tablet (500 mg total) by mouth daily with breakfast., Disp: 30 tablet, Rfl: 0   methocarbamol (ROBAXIN-750) 750 MG tablet, Take 1 tablet (750 mg total) by mouth 4 (four) times daily., Disp: 30 tablet, Rfl: 3   omeprazole (PRILOSEC) 40 MG capsule, Take 1 capsule (40 mg total) by mouth daily., Disp: 90 capsule, Rfl: 3    polyethylene glycol powder (GLYCOLAX/MIRALAX) 17 GM/SCOOP powder, 17 gram twice daily until stooling regularly, then once daily and prn, Disp: 3350 g, Rfl: 1   propranolol (INDERAL) 10 MG tablet, Take 10 mg by mouth 2 (two) times daily., Disp: , Rfl:    rosuvastatin (CRESTOR) 5 MG tablet, TAKE ONE TABLET BY MOUTH DAILY, Disp: 90 tablet, Rfl: 3   sertraline (ZOLOFT) 25 MG tablet, Take 12.5 mg by mouth daily., Disp: , Rfl:    Vitamin D, Ergocalciferol, (DRISDOL) 1.25 MG (50000 UNIT) CAPS capsule, TAKE ONE CAPSULE BY MOUTH EVERY 7 DAYS, Disp: 4 capsule, Rfl: 0   Medications ordered in this encounter:  Meds ordered this encounter  Medications   brompheniramine-pseudoephedrine-DM 30-2-10 MG/5ML syrup    Sig: Take 5 mLs by mouth 4 (four) times daily as needed.    Dispense:  120 mL    Refill:  0    Order Specific Question:   Supervising Provider    Answer:   Chase Picket [[id number]]     *If you need refills on other medications prior to your next appointment, please contact your pharmacy*  Follow-Up: Call back or seek an in-person evaluation if the symptoms worsen or if the condition fails to improve as anticipated.  [facility] [phone]  Other Instructions Recommend Tylenol or Ibuprofen as needed for fevers Can take cough syrup as needed for cough Drink plenty of fluids, rest If you develop worsening symptoms recommend in  person evaluation with your primary care physician or Urgent Care    If you have been instructed to have an in-person evaluation today at a local Urgent Care facility, please use the link below. It will take you to a list of all of our available [facility], including address, phone number and hours of operation. Please do not delay care.  [facility]  If you or a family member do not have a primary care provider, use the link below to schedule a visit and establish care. When you choose a [facility] primary care physician  or advanced practice provider, you gain a long-term partner in health. Find a Primary Care Provider  Learn more about [facility] in-office and virtual care options: [facility]

## 2022-02-08 NOTE — Progress Notes (Signed)
Virtual Visit Consent   Tony [name], you are scheduled for a virtual visit with a [facility] provider today. Just as with appointments in the office, your consent must be obtained to participate. Your consent will be active for this visit and any virtual visit you may have with one of our providers in the next 365 days. If you have a MyChart account, a copy of this consent can be sent to you electronically.  As this is a virtual visit, video technology does not allow for your provider to perform a traditional examination. This may limit your provider's ability to fully assess your condition. If your provider identifies any concerns that need to be evaluated in person or the need to arrange testing (such as labs, EKG, etc.), we will make arrangements to do so. Although advances in technology are sophisticated, we cannot ensure that it will always work on either your end or our end. If the connection with a video visit is poor, the visit may have to be switched to a telephone visit. With either a video or telephone visit, we are not always able to ensure that we have a secure connection.  By engaging in this virtual visit, you consent to the provision of healthcare and authorize for your insurance to be billed (if applicable) for the services provided during this visit. Depending on your insurance coverage, you may receive a charge related to this service.  I need to obtain your verbal consent now. Are you willing to proceed with your visit today? Tony [name] has provided verbal consent on 02/08/2022 for a virtual visit (video or telephone). Lenise Arena Ward, PA-C  Date: 02/08/2022 5:56 PM  Virtual Visit via Video Note   I, Lenise Arena Ward, connected with  Tony [name]  ([id number], 05-Jan-1976) on 02/08/22 at  5:45 PM EST by a video-enabled telemedicine application and verified that I am speaking with the correct person using two identifiers.  Location: Patient: Virtual Visit Location Patient:  Home Provider: Virtual Visit Location Provider: Home Office   I discussed the limitations of evaluation and management by telemedicine and the availability of in person appointments. The patient expressed understanding and agreed to proceed.    History of Present Illness: Tony [name] is a 46 y.o. who identifies as a male who was assigned male at birth, and is being seen today for fevers.  He reports he is on day 6 of symptoms which include fever, mild congestion, intermittent cough.  Also reports some nausea, but denies abdominal pain, vomiting, diarrhea.  He reports overall symptoms are improving, but he is still experiencing intermittent fevers.  He reports reduction of fever with Tylenol. He is taking no other medications at this time.  He did take a home COVID test at day 3 which was negative.  HPI: HPI  Problems:  Patient Active Problem List   Diagnosis Date Noted   Other hyperlipidemia 12/19/2021   Constipation 10/17/2021   Prediabetes 09/26/2021   Class 2 severe obesity with serious comorbidity and body mass index (BMI) of 39.0 to 39.9 in adult [facility]) 07/09/2021   Left knee pain 08/16/2020   Other fatigue 04/13/2020   Shortness of breath on exertion 04/13/2020   Screening for depression 04/13/2020   Vitamin D deficiency 04/13/2020   At risk for heart disease 04/13/2020   Diabetes mellitus ([place]) 04/13/2020   Pain in finger of left hand 04/05/2020   Nonallopathic lesion of sacral region 10/19/2019   Nonallopathic lesion of lumbar  region 10/19/2019   Nonallopathic lesion of thoracic region 10/19/2019   Degenerative disc disease, lumbar 09/29/2019   Morbid obesity ([place]) 09/09/2019   Abnormal weight gain 05/25/2015   Hyperlipidemia, mixed 05/25/2015   Abdominal pain, chronic, right lower quadrant 01/13/2014   Hypertension associated with diabetes ([place]) 12/08/2009   BACK PAIN 12/08/2009   INSOMNIA 01/18/2008   GAD (generalized anxiety disorder) 09/04/2007   SORE THROAT  09/04/2007   INTERMITTENT VERTIGO 03/09/2007   ELEVATED BLOOD PRESSURE WITHOUT DIAGNOSIS OF HYPERTENSION 03/09/2007    Allergies: No Known Allergies Medications:  Current Outpatient Medications:    brompheniramine-pseudoephedrine-DM 30-2-10 MG/5ML syrup, Take 5 mLs by mouth 4 (four) times daily as needed., Disp: 120 mL, Rfl: 0   carbamazepine (TEGRETOL) 100 MG chewable tablet, TAKE 1 TABLET BY MOUTH IN THE MORNING THEN TAKE 2 TABLETS BY MOUTH AT BEDTIME, Disp: , Rfl:    clonazePAM (KLONOPIN) 1 MG tablet, Take 1 tablet (1 mg total) by mouth 3 (three) times daily as needed for anxiety., Disp: 270 tablet, Rfl: 1   Eszopiclone 3 MG TABS, TAKE ONE TABLET BY MOUTH EVERY NIGHT IMMEDIATELY BEFORE BEDTIME, Disp: 90 tablet, Rfl: 1   fluticasone (FLONASE) 50 MCG/ACT nasal spray, Place 2 sprays into both nostrils daily., Disp: 16 g, Rfl: 0   gabapentin (NEURONTIN) 300 MG capsule, TAKE 1 CAPSULE BY MOUTH AT BEDTIME, Disp: 90 capsule, Rfl: 0   meloxicam (MOBIC) 15 MG tablet, Take 1 tablet (15 mg total) by mouth daily as needed for pain., Disp: 30 tablet, Rfl: 3   metFORMIN (GLUCOPHAGE) 500 MG tablet, Take 1 tablet (500 mg total) by mouth daily with breakfast., Disp: 30 tablet, Rfl: 0   methocarbamol (ROBAXIN-750) 750 MG tablet, Take 1 tablet (750 mg total) by mouth 4 (four) times daily., Disp: 30 tablet, Rfl: 3   omeprazole (PRILOSEC) 40 MG capsule, Take 1 capsule (40 mg total) by mouth daily., Disp: 90 capsule, Rfl: 3   polyethylene glycol powder (GLYCOLAX/MIRALAX) 17 GM/SCOOP powder, 17 gram twice daily until stooling regularly, then once daily and prn, Disp: 3350 g, Rfl: 1   propranolol (INDERAL) 10 MG tablet, Take 10 mg by mouth 2 (two) times daily., Disp: , Rfl:    rosuvastatin (CRESTOR) 5 MG tablet, TAKE ONE TABLET BY MOUTH DAILY, Disp: 90 tablet, Rfl: 3   sertraline (ZOLOFT) 25 MG tablet, Take 12.5 mg by mouth daily., Disp: , Rfl:    Vitamin D, Ergocalciferol, (DRISDOL) 1.25 MG (50000 UNIT) CAPS  capsule, TAKE ONE CAPSULE BY MOUTH EVERY 7 DAYS, Disp: 4 capsule, Rfl: 0  Observations/Objective: Patient is well-developed, well-nourished in no acute distress.  Resting comfortably at home.  Head is normocephalic, atraumatic.  No labored breathing.  Speech is clear and coherent with logical content.  Patient is alert and oriented at baseline.    Assessment and Plan: 1. URI, acute - brompheniramine-pseudoephedrine-DM 30-2-10 MG/5ML syrup; Take 5 mLs by mouth 4 (four) times daily as needed.  Dispense: 120 mL; Refill: 0  Recommend cough syrup as needed for cough.  Supportive care discussed. At this time pt is overall stable, sx are improving.  In persion evaluation precautions given.   Follow Up Instructions: I discussed the assessment and treatment plan with the patient. The patient was provided an opportunity to ask questions and all were answered. The patient agreed with the plan and demonstrated an understanding of the instructions.  A copy of instructions were sent to the patient via MyChart unless otherwise noted below.  The patient was advised to call back or seek an in-person evaluation if the symptoms worsen or if the condition fails to improve as anticipated.  Time:  I spent 11 minutes with the patient via telehealth technology discussing the above problems/concerns.    Lenise Arena Ward, PA-C

## 2022-02-09 ENCOUNTER — Other Ambulatory Visit (INDEPENDENT_AMBULATORY_CARE_PROVIDER_SITE_OTHER): Payer: Self-pay | Admitting: Family Medicine

## 2022-02-09 DIAGNOSIS — E559 Vitamin D deficiency, unspecified: Secondary | ICD-10-CM

## 2022-02-19 ENCOUNTER — Telehealth: Payer: Self-pay | Admitting: Nurse Practitioner

## 2022-02-19 DIAGNOSIS — R110 Nausea: Secondary | ICD-10-CM

## 2022-02-19 DIAGNOSIS — R197 Diarrhea, unspecified: Secondary | ICD-10-CM

## 2022-02-19 MED ORDER — ONDANSETRON HCL 4 MG PO TABS: 4.0000 mg | ORAL_TABLET | Freq: Three times a day (TID) | ORAL | 0 refills | Status: DC | PRN

## 2022-02-19 NOTE — Progress Notes (Signed)
Virtual Visit Consent   Tony [name], you are scheduled for a virtual visit with a [facility] provider today. Just as with appointments in the office, your consent must be obtained to participate. Your consent will be active for this visit and any virtual visit you may have with one of our providers in the next 365 days. If you have a MyChart account, a copy of this consent can be sent to you electronically.  As this is a virtual visit, video technology does not allow for your provider to perform a traditional examination. This may limit your provider's ability to fully assess your condition. If your provider identifies any concerns that need to be evaluated in person or the need to arrange testing (such as labs, EKG, etc.), we will make arrangements to do so. Although advances in technology are sophisticated, we cannot ensure that it will always work on either your end or our end. If the connection with a video visit is poor, the visit may have to be switched to a telephone visit. With either a video or telephone visit, we are not always able to ensure that we have a secure connection.  By engaging in this virtual visit, you consent to the provision of healthcare and authorize for your insurance to be billed (if applicable) for the services provided during this visit. Depending on your insurance coverage, you may receive a charge related to this service.  I need to obtain your verbal consent now. Are you willing to proceed with your visit today? BRENDYN [facility] has provided verbal consent on 02/19/2022 for a virtual visit (video or telephone). [name], FNP  Date: 02/19/2022 2:30 PM  Virtual Visit via Video Note   I, [name], connected with  Tony [name]  ([id number], September 28, 1975) on 02/19/22 at  2:30 PM EST by a video-enabled telemedicine application and verified that I am speaking with the correct person using two identifiers.  Location: Patient: Virtual Visit Location Patient:  Home Provider: Virtual Visit Location Provider: Home Office   I discussed the limitations of evaluation and management by telemedicine and the availability of in person appointments. The patient expressed understanding and agreed to proceed.    History of Present Illness: Tony [name] is a 46 y.o. who identifies as a male who was assigned male at birth, and is being seen today for nausea/diarrhea.  He was seen on VV 02/08/22 with URI symptoms he improved from those symptoms (assumed COVID without diagnosis), then his wife got sick and tested positive for COVID today.   Patient tested negative for COVID today His symptoms started with nausea, he then developed a fever that has since improved  Today the majority of his symptoms are diarrhea  Today is the third day of symptoms   Problems:  Patient Active Problem List   Diagnosis Date Noted   Other hyperlipidemia 12/19/2021   Constipation 10/17/2021   Prediabetes 09/26/2021   Class 2 severe obesity with serious comorbidity and body mass index (BMI) of 39.0 to 39.9 in adult [facility]) 07/09/2021   Left knee pain 08/16/2020   Other fatigue 04/13/2020   Shortness of breath on exertion 04/13/2020   Screening for depression 04/13/2020   Vitamin D deficiency 04/13/2020   At risk for heart disease 04/13/2020   Diabetes mellitus ([place]) 04/13/2020   Pain in finger of left hand 04/05/2020   Nonallopathic lesion of sacral region 10/19/2019   Nonallopathic lesion of lumbar region 10/19/2019   Nonallopathic lesion of thoracic  region 10/19/2019   Degenerative disc disease, lumbar 09/29/2019   Morbid obesity ([place]) 09/09/2019   Abnormal weight gain 05/25/2015   Hyperlipidemia, mixed 05/25/2015   Abdominal pain, chronic, right lower quadrant 01/13/2014   Hypertension associated with diabetes ([place]) 12/08/2009   BACK PAIN 12/08/2009   INSOMNIA 01/18/2008   GAD (generalized anxiety disorder) 09/04/2007   SORE THROAT 09/04/2007   INTERMITTENT VERTIGO  03/09/2007   ELEVATED BLOOD PRESSURE WITHOUT DIAGNOSIS OF HYPERTENSION 03/09/2007    Allergies: No Known Allergies Medications:  Current Outpatient Medications:    brompheniramine-pseudoephedrine-DM 30-2-10 MG/5ML syrup, Take 5 mLs by mouth 4 (four) times daily as needed., Disp: 120 mL, Rfl: 0   carbamazepine (TEGRETOL) 100 MG chewable tablet, TAKE 1 TABLET BY MOUTH IN THE MORNING THEN TAKE 2 TABLETS BY MOUTH AT BEDTIME, Disp: , Rfl:    clonazePAM (KLONOPIN) 1 MG tablet, Take 1 tablet (1 mg total) by mouth 3 (three) times daily as needed for anxiety., Disp: 270 tablet, Rfl: 1   Eszopiclone 3 MG TABS, TAKE ONE TABLET BY MOUTH EVERY NIGHT IMMEDIATELY BEFORE BEDTIME, Disp: 90 tablet, Rfl: 1   fluticasone (FLONASE) 50 MCG/ACT nasal spray, Place 2 sprays into both nostrils daily., Disp: 16 g, Rfl: 0   gabapentin (NEURONTIN) 300 MG capsule, TAKE 1 CAPSULE BY MOUTH AT BEDTIME, Disp: 90 capsule, Rfl: 0   meloxicam (MOBIC) 15 MG tablet, Take 1 tablet (15 mg total) by mouth daily as needed for pain., Disp: 30 tablet, Rfl: 3   metFORMIN (GLUCOPHAGE) 500 MG tablet, Take 1 tablet (500 mg total) by mouth daily with breakfast., Disp: 30 tablet, Rfl: 0   methocarbamol (ROBAXIN-750) 750 MG tablet, Take 1 tablet (750 mg total) by mouth 4 (four) times daily., Disp: 30 tablet, Rfl: 3   omeprazole (PRILOSEC) 40 MG capsule, Take 1 capsule (40 mg total) by mouth daily., Disp: 90 capsule, Rfl: 3   polyethylene glycol powder (GLYCOLAX/MIRALAX) 17 GM/SCOOP powder, 17 gram twice daily until stooling regularly, then once daily and prn, Disp: 3350 g, Rfl: 1   propranolol (INDERAL) 10 MG tablet, Take 10 mg by mouth 2 (two) times daily., Disp: , Rfl:    rosuvastatin (CRESTOR) 5 MG tablet, TAKE ONE TABLET BY MOUTH DAILY, Disp: 90 tablet, Rfl: 3   sertraline (ZOLOFT) 25 MG tablet, Take 12.5 mg by mouth daily., Disp: , Rfl:    Vitamin D, Ergocalciferol, (DRISDOL) 1.25 MG (50000 UNIT) CAPS capsule, TAKE ONE CAPSULE BY MOUTH EVERY  7 DAYS, Disp: 4 capsule, Rfl: 0  Observations/Objective: Patient is well-developed, well-nourished in no acute distress.  Resting comfortably  at home.  Head is normocephalic, atraumatic.  No labored breathing.  Speech is clear and coherent with logical content.  Patient is alert and oriented at baseline.    Assessment and Plan: 1. Diarrhea, unspecified type BRAT/bland diet  Push fluids Assure hydration   2. Nausea  - ondansetron (ZOFRAN) 4 MG tablet; Take 1 tablet (4 mg total) by mouth every 8 (eight) hours as needed for nausea or vomiting.  Dispense: 20 tablet; Refill: 0     Follow Up Instructions: I discussed the assessment and treatment plan with the patient. The patient was provided an opportunity to ask questions and all were answered. The patient agreed with the plan and demonstrated an understanding of the instructions.  A copy of instructions were sent to the patient via MyChart unless otherwise noted below.    The patient was advised to call back or seek an in-person evaluation if  the symptoms worsen or if the condition fails to improve as anticipated.  Time:  I spent 10 minutes with the patient via telehealth technology discussing the above problems/concerns.    [name], FNP

## 2022-02-22 ENCOUNTER — Telehealth: Payer: Self-pay | Admitting: Physician Assistant

## 2022-02-22 DIAGNOSIS — A049 Bacterial intestinal infection, unspecified: Secondary | ICD-10-CM

## 2022-02-22 MED ORDER — AZITHROMYCIN 500 MG PO TABS: 500.0000 mg | ORAL_TABLET | Freq: Every day | ORAL | 0 refills | Status: AC

## 2022-02-22 NOTE — Patient Instructions (Signed)
Tony [name], thank you for joining [name], PA-C for today's virtual visit.  While this provider is not your primary care provider (PCP), if your PCP is located in our provider database this encounter information will be shared with them immediately following your visit.   [place] account gives you access to today's visit and all your visits, tests, and labs performed at [name] " click here if you don't have a [facility] account or go to [email or web address]  Consent: (Patient) Tony [name] provided verbal consent for this virtual visit at the beginning of the encounter.  Current Medications:  Current Outpatient Medications:    brompheniramine-pseudoephedrine-DM 30-2-10 MG/5ML syrup, Take 5 mLs by mouth 4 (four) times daily as needed., Disp: 120 mL, Rfl: 0   carbamazepine (TEGRETOL) 100 MG chewable tablet, TAKE 1 TABLET BY MOUTH IN THE MORNING THEN TAKE 2 TABLETS BY MOUTH AT BEDTIME, Disp: , Rfl:    clonazePAM (KLONOPIN) 1 MG tablet, Take 1 tablet (1 mg total) by mouth 3 (three) times daily as needed for anxiety., Disp: 270 tablet, Rfl: 1   Eszopiclone 3 MG TABS, TAKE ONE TABLET BY MOUTH EVERY NIGHT IMMEDIATELY BEFORE BEDTIME, Disp: 90 tablet, Rfl: 1   fluticasone (FLONASE) 50 MCG/ACT nasal spray, Place 2 sprays into both nostrils daily., Disp: 16 g, Rfl: 0   gabapentin (NEURONTIN) 300 MG capsule, TAKE 1 CAPSULE BY MOUTH AT BEDTIME, Disp: 90 capsule, Rfl: 0   meloxicam (MOBIC) 15 MG tablet, Take 1 tablet (15 mg total) by mouth daily as needed for pain., Disp: 30 tablet, Rfl: 3   metFORMIN (GLUCOPHAGE) 500 MG tablet, Take 1 tablet (500 mg total) by mouth daily with breakfast., Disp: 30 tablet, Rfl: 0   methocarbamol (ROBAXIN-750) 750 MG tablet, Take 1 tablet (750 mg total) by mouth 4 (four) times daily., Disp: 30 tablet, Rfl: 3   omeprazole (PRILOSEC) 40 MG capsule, Take 1 capsule (40 mg total) by mouth daily., Disp: 90 capsule, Rfl:  3   ondansetron (ZOFRAN) 4 MG tablet, Take 1 tablet (4 mg total) by mouth every 8 (eight) hours as needed for nausea or vomiting., Disp: 20 tablet, Rfl: 0   polyethylene glycol powder (GLYCOLAX/MIRALAX) 17 GM/SCOOP powder, 17 gram twice daily until stooling regularly, then once daily and prn, Disp: 3350 g, Rfl: 1   propranolol (INDERAL) 10 MG tablet, Take 10 mg by mouth 2 (two) times daily., Disp: , Rfl:    rosuvastatin (CRESTOR) 5 MG tablet, TAKE ONE TABLET BY MOUTH DAILY, Disp: 90 tablet, Rfl: 3   sertraline (ZOLOFT) 25 MG tablet, Take 12.5 mg by mouth daily., Disp: , Rfl:    Vitamin D, Ergocalciferol, (DRISDOL) 1.25 MG (50000 UNIT) CAPS capsule, TAKE ONE CAPSULE BY MOUTH EVERY 7 DAYS, Disp: 4 capsule, Rfl: 0   Medications ordered in this encounter:  No orders of the defined types were placed in this encounter.    *If you need refills on other medications prior to your next appointment, please contact your pharmacy*  Follow-Up: Call back or seek an in-person evaluation if the symptoms worsen or if the condition fails to improve as anticipated.  Tira [phone]  Other Instructions Please keep hydrated and rest. Ok to continue Tylenol for fever. Keep a bland diet -- see below. Take the antibiotic as directed. If no substantial improvement in next couple of days or anything new/worsening you must seek an in person evaluation ASAP. DO NOT DELAY CARE.  Food Choices to Help Relieve Diarrhea, Adult Diarrhea can make you feel weak and cause you to become dehydrated. Dehydration is a condition in which there is not enough water or other fluids in the body. It is important to choose the right foods and drinks to: Relieve diarrhea. Replace lost fluids and nutrients. Prevent dehydration. What are tips for following this plan? Relieving diarrhea Avoid foods that make your diarrhea worse. These may include: Foods and drinks that are sweetened with high-fructose corn  syrup, honey, or sweeteners such as xylitol, sorbitol, and mannitol. Check food labels for these ingredients. Fried, greasy, or spicy foods. Raw fruits and vegetables. Eat foods that are rich in probiotics. These include foods such as yogurt and fermented milk products. Probiotics can help increase healthy bacteria in your stomach and intestines (gastrointestinal or GI tract). This may help digestion and stop diarrhea. If you have lactose intolerance, avoid dairy products. These may make your diarrhea worse. Take medicine to help stop diarrhea only as told by your health care provider. Replacing nutrients  Eat bland, easy-to-digest foods in small amounts as you are able, until your diarrhea starts to get better. These foods include bananas, applesauce, rice, toast, and crackers. Over time, add nutrient-rich foods as your body tolerates them or as told by your health care provider. These include: Well-cooked protein foods, such as eggs, lean meats like fish or chicken without skin, and tofu. Peeled, seeded, and soft-cooked fruits and vegetables. Low-fat dairy products. Whole grains. Take vitamin and mineral supplements as told by your health care provider. Preventing dehydration  Start by sipping water or a solution to prevent dehydration (oral rehydration solution, or ORS). This is a drink that helps replace fluids and minerals your body has lost. You can buy an ORS at pharmacies and retail stores. Try to drink at least 8-10 cups (2,000-2,500 mL) of fluid each day to help replace lost fluids. If your urine is pale yellow, you are getting enough fluids. You may drink other liquids in addition to water, such as fruit juice that you have added water to (diluted fruit juice) or low-calorie sports drinks, as tolerated or as told by your health care provider. Avoid drinks with caffeine, such as coffee, tea, or soft drinks. Avoid alcohol. This information is not intended to replace advice given to you  by your health care provider. Make sure you discuss any questions you have with your health care provider. Document Revised: 07/31/2021 Document Reviewed: 07/31/2021 Elsevier Patient Education  [place].    If you have been instructed to have an in-person evaluation today at a local Urgent Care facility, please use the link below. It will take you to a list of all of our available [facility], including address, phone number and hours of operation. Please do not delay care.  [facility]  If you or a family member do not have a primary care provider, use the link below to schedule a visit and establish care. When you choose a [facility] primary care physician or advanced practice provider, you gain a long-term partner in health. Find a Primary Care Provider  Learn more about [facility]'s in-office and virtual care options: [facility]

## 2022-02-22 NOTE — Progress Notes (Signed)
Virtual Visit Consent   Tony [name], you are scheduled for a virtual visit with a [facility] provider today. Just as with appointments in the office, your consent must be obtained to participate. Your consent will be active for this visit and any virtual visit you may have with one of our providers in the next 365 days. If you have a MyChart account, a copy of this consent can be sent to you electronically.  As this is a virtual visit, video technology does not allow for your provider to perform a traditional examination. This may limit your provider's ability to fully assess your condition. If your provider identifies any concerns that need to be evaluated in person or the need to arrange testing (such as labs, EKG, etc.), we will make arrangements to do so. Although advances in technology are sophisticated, we cannot ensure that it will always work on either your end or our end. If the connection with a video visit is poor, the visit may have to be switched to a telephone visit. With either a video or telephone visit, we are not always able to ensure that we have a secure connection.  By engaging in this virtual visit, you consent to the provision of healthcare and authorize for your insurance to be billed (if applicable) for the services provided during this visit. Depending on your insurance coverage, you may receive a charge related to this service.  I need to obtain your verbal consent now. Are you willing to proceed with your visit today? Tony [name] has provided verbal consent on 02/22/2022 for a virtual visit (video or telephone). Tony [name], [place]  Date: 02/22/2022 9:14 AM  Virtual Visit via Video Note   I, Tony [name], connected with  Tony [name]  ([id number], [birth year]) on 02/22/22 at  9:00 AM EST by a video-enabled telemedicine application and verified that I am speaking with the correct person using two identifiers.  Location: Patient: Virtual Visit Location  Patient: Home Provider: Virtual Visit Location Provider: Home Office   I discussed the limitations of evaluation and management by telemedicine and the availability of in person appointments. The patient expressed understanding and agreed to proceed.    History of Present Illness: Tony [name] is a 46 y.o. who identifies as a male who was assigned male at birth, and is being seen today for ongoing diarrhea for the past 6-7 days. Was seen on 12/26 and started on supportive measures. Is pushing fluids and noting good urinary output with pale yellow urine. Still having substantial frequent stools. Denies blood or pus. Now with low-grade fever. Denies emesis. All COVID tests have been negative (wife with COVID recently). No URI symptoms at present. Was told to do another video visit if not improving to be started on antibiotics.    HPI: HPI  Problems:  Patient Active Problem List   Diagnosis Date Noted   Other hyperlipidemia 12/19/2021   Constipation 10/17/2021   Prediabetes 09/26/2021   Class 2 severe obesity with serious comorbidity and body mass index (BMI) of 39.0 to 39.9 in adult [facility]) 07/09/2021   Left knee pain 08/16/2020   Other fatigue 04/13/2020   Shortness of breath on exertion 04/13/2020   Screening for depression 04/13/2020   Vitamin D deficiency 04/13/2020   At risk for heart disease 04/13/2020   Diabetes mellitus ([place]) 04/13/2020   Pain in finger of left hand 04/05/2020   Nonallopathic lesion of sacral region 10/19/2019   Nonallopathic lesion of  lumbar region 10/19/2019   Nonallopathic lesion of thoracic region 10/19/2019   Degenerative disc disease, lumbar 09/29/2019   Morbid obesity ([place]) 09/09/2019   Abnormal weight gain 05/25/2015   Hyperlipidemia, mixed 05/25/2015   Abdominal pain, chronic, right lower quadrant 01/13/2014   Hypertension associated with diabetes ([place]) 12/08/2009   BACK PAIN 12/08/2009   INSOMNIA 01/18/2008   GAD (generalized anxiety disorder)  09/04/2007   SORE THROAT 09/04/2007   INTERMITTENT VERTIGO 03/09/2007   ELEVATED BLOOD PRESSURE WITHOUT DIAGNOSIS OF HYPERTENSION 03/09/2007    Allergies: No Known Allergies Medications:  Current Outpatient Medications:    brompheniramine-pseudoephedrine-DM 30-2-10 MG/5ML syrup, Take 5 mLs by mouth 4 (four) times daily as needed., Disp: 120 mL, Rfl: 0   carbamazepine (TEGRETOL) 100 MG chewable tablet, TAKE 1 TABLET BY MOUTH IN THE MORNING THEN TAKE 2 TABLETS BY MOUTH AT BEDTIME, Disp: , Rfl:    clonazePAM (KLONOPIN) 1 MG tablet, Take 1 tablet (1 mg total) by mouth 3 (three) times daily as needed for anxiety., Disp: 270 tablet, Rfl: 1   Eszopiclone 3 MG TABS, TAKE ONE TABLET BY MOUTH EVERY NIGHT IMMEDIATELY BEFORE BEDTIME, Disp: 90 tablet, Rfl: 1   fluticasone (FLONASE) 50 MCG/ACT nasal spray, Place 2 sprays into both nostrils daily., Disp: 16 g, Rfl: 0   gabapentin (NEURONTIN) 300 MG capsule, TAKE 1 CAPSULE BY MOUTH AT BEDTIME, Disp: 90 capsule, Rfl: 0   meloxicam (MOBIC) 15 MG tablet, Take 1 tablet (15 mg total) by mouth daily as needed for pain., Disp: 30 tablet, Rfl: 3   metFORMIN (GLUCOPHAGE) 500 MG tablet, Take 1 tablet (500 mg total) by mouth daily with breakfast., Disp: 30 tablet, Rfl: 0   methocarbamol (ROBAXIN-750) 750 MG tablet, Take 1 tablet (750 mg total) by mouth 4 (four) times daily., Disp: 30 tablet, Rfl: 3   omeprazole (PRILOSEC) 40 MG capsule, Take 1 capsule (40 mg total) by mouth daily., Disp: 90 capsule, Rfl: 3   ondansetron (ZOFRAN) 4 MG tablet, Take 1 tablet (4 mg total) by mouth every 8 (eight) hours as needed for nausea or vomiting., Disp: 20 tablet, Rfl: 0   polyethylene glycol powder (GLYCOLAX/MIRALAX) 17 GM/SCOOP powder, 17 gram twice daily until stooling regularly, then once daily and prn, Disp: 3350 g, Rfl: 1   propranolol (INDERAL) 10 MG tablet, Take 10 mg by mouth 2 (two) times daily., Disp: , Rfl:    rosuvastatin (CRESTOR) 5 MG tablet, TAKE ONE TABLET BY MOUTH  DAILY, Disp: 90 tablet, Rfl: 3   sertraline (ZOLOFT) 25 MG tablet, Take 12.5 mg by mouth daily., Disp: , Rfl:    Vitamin D, Ergocalciferol, (DRISDOL) 1.25 MG (50000 UNIT) CAPS capsule, TAKE ONE CAPSULE BY MOUTH EVERY 7 DAYS, Disp: 4 capsule, Rfl: 0  Observations/Objective: Patient is well-developed, well-nourished in no acute distress.  Resting comfortably at home.  Head is normocephalic, atraumatic.  No labored breathing. Speech is clear and coherent with logical content.  Patient is alert and oriented at baseline.   Assessment and Plan: 1. Bacterial gastroenteritis  Continued diarrhea and now low-grade fever. No alarm signs or symptoms. Continue supportive measures and OTC medications. Will start on Azithromycin 500 mg daily for 3 days. If no substantial improvement within that time or anything worsens, he is aware that he must be seen in person. WE WILL NOT CONTINUE TO TREAT VIRTUALLY.  Follow Up Instructions: I discussed the assessment and treatment plan with the patient. The patient was provided an opportunity to ask questions and all were answered.  The patient agreed with the plan and demonstrated an understanding of the instructions.  A copy of instructions were sent to the patient via MyChart unless otherwise noted below.   The patient was advised to call back or seek an in-person evaluation if the symptoms worsen or if the condition fails to improve as anticipated.  Time:  I spent 10 minutes with the patient via telehealth technology discussing the above problems/concerns.    Tony Rio, PA-C

## 2022-02-23 ENCOUNTER — Other Ambulatory Visit (INDEPENDENT_AMBULATORY_CARE_PROVIDER_SITE_OTHER): Payer: Self-pay | Admitting: Adult Health

## 2022-02-23 DIAGNOSIS — E1169 Type 2 diabetes mellitus with other specified complication: Secondary | ICD-10-CM

## 2022-02-28 ENCOUNTER — Other Ambulatory Visit (INDEPENDENT_AMBULATORY_CARE_PROVIDER_SITE_OTHER): Payer: Self-pay | Admitting: Family Medicine

## 2022-02-28 DIAGNOSIS — E1169 Type 2 diabetes mellitus with other specified complication: Secondary | ICD-10-CM

## 2022-03-01 ENCOUNTER — Other Ambulatory Visit: Payer: Self-pay

## 2022-03-01 MED ORDER — METHOCARBAMOL 750 MG PO TABS: 750.0000 mg | ORAL_TABLET | Freq: Four times a day (QID) | ORAL | 3 refills | Status: DC

## 2022-03-05 NOTE — Progress Notes (Deleted)
[place] Phone: [phone] Subjective:    I'm seeing this patient by the request  of:  McGowen, Adrian Blackwater, MD  CC:   [id number]  Tony [name] is a 47 y.o. male coming in with complaint of back and neck pain. OMT 12/18/2021. Patient states   Medications patient has been prescribed: None  Taking:         Reviewed prior external information including notes and imaging from previsou exam, outside providers and external EMR if available.   As well as notes that were available from care everywhere and other healthcare systems.  Past medical history, social, surgical and family history all reviewed in electronic medical record.  No pertanent information unless stated regarding to the chief complaint.   Past Medical History:  Diagnosis Date   Alcohol abuse 2016/2017   Pt states he was self medicating his anxiety.  Quit 04/2015.  Minimal intake as of 05/2017 f/u.   Anal fissure    Anxiety and depression    Back pain    Borderline diabetes    Erectile dysfunction    cialis 42m qd-qod started by urol 09/2020   Hepatic steatosis 2015; 2016; 02/2016   +Alcohol has played a role.  CT abd showed fatty liver+ liver enzymes mildly elevated (chron viral hepatitis screening NEG).  Abd u/s 03/26/16 showed fatty liver, small GB polyps, o/w normal.   History of kidney stones    [facility]   HTN (hypertension)    Hx of colonic polyp - ssp 02/11/2014   No polyps 02/2020->recall 10 yrs   Hyperlipidemia    Statin started 05/2018   Insomnia    Obesity, Class II, BMI 35-39.9    Psoriasis    Secondary male hypogonadism    clomiphene 06/2019: normalized on clomiphene 08/2019   Shortness of breath on exertion     No Known Allergies   Review of Systems:  No headache, visual changes, nausea, vomiting, diarrhea, constipation, dizziness, abdominal pain, skin rash, fevers, chills, night sweats, weight loss, swollen lymph  nodes, body aches, joint swelling, chest pain, shortness of breath, mood changes. POSITIVE muscle aches  Objective  There were no vitals taken for this visit.   General: No apparent distress alert and oriented x3 mood and affect normal, dressed appropriately.  HEENT: Pupils equal, extraocular movements intact  Respiratory: Patient's speak in full sentences and does not appear short of breath  Cardiovascular: No lower extremity edema, non tender, no erythema  Gait MSK:  Back   Osteopathic findings  C2 flexed rotated and side bent right C6 flexed rotated and side bent left T3 extended rotated and side bent right inhaled rib T9 extended rotated and side bent left L2 flexed rotated and side bent right Sacrum right on right       Assessment and Plan:  No problem-specific Assessment & Plan notes found for this encounter.    Nonallopathic problems  Decision today to treat with OMT was based on Physical Exam  After verbal consent patient was treated with HVLA, ME, FPR techniques in cervical, rib, thoracic, lumbar, and sacral  areas  Patient tolerated the procedure well with improvement in symptoms  Patient given exercises, stretches and lifestyle modifications  See medications in patient instructions if given  Patient will follow up in 4-8 weeks             Note: This dictation was prepared with Dragon dictation along with smaller phrase  technology. Any transcriptional errors that result from this process are unintentional.

## 2022-03-07 ENCOUNTER — Other Ambulatory Visit (INDEPENDENT_AMBULATORY_CARE_PROVIDER_SITE_OTHER): Payer: Self-pay | Admitting: Family Medicine

## 2022-03-07 DIAGNOSIS — E1169 Type 2 diabetes mellitus with other specified complication: Secondary | ICD-10-CM

## 2022-03-11 ENCOUNTER — Other Ambulatory Visit: Payer: Self-pay

## 2022-03-11 ENCOUNTER — Ambulatory Visit: Payer: BC Managed Care – PPO | Admitting: Family Medicine

## 2022-03-11 MED ORDER — ROSUVASTATIN CALCIUM 5 MG PO TABS: 5.0000 mg | ORAL_TABLET | Freq: Every day | ORAL | 1 refills | Status: DC

## 2022-03-12 NOTE — Progress Notes (Signed)
TeleHealth Visit:  This visit was completed with telemedicine (audio/video) technology. Marcello has verbally consented to this TeleHealth visit. The patient is located at home, the provider is located at home. The participants in this visit include the listed provider and patient. The visit was conducted today via MyChart video.  OBESITY [name] is here to discuss his progress with his obesity treatment plan along with follow-up of his obesity related diagnoses.   Today's visit was # 24 Starting weight: 270 lbs Starting date: 04/13/2020 Weight at last in office visit: 238 lbs on 12/12/21 Total weight loss: 32 lbs at last in office visit on 12/12/21. Today's reported weight:  No weight reported.  Nutrition Plan: keeping a food journal and adhering to recommended goals of 1400-1600 calories and 120 protein.   Current exercise: none  Interim History:  Just recovered from a bacterial GI infection that lasted 9 days.  He had a course of azithromycin which stopped the diarrhea in a day.  He had diarrhea up to 20 times per day.  He has not exercise recently due to fatigue. Before that he was walking for 60 minutes 7 days/week and doing resistance training 3 times per week. He is not consistent with journaling but tries to make smart choices.  Reports he is back on track today.  Had 2 eggs and yogurt for breakfast.  Currently looking for new health insurance so he would like to delay another visit for about 8 weeks.  Assessment/Plan:  1. Type II Diabetes HgbA1c is at goal. Last A1c was 5.6.  He said some providers had said he does not have diabetes.  He had an A1c of 6.7 in 2017 and 6.5 in 2018. Medication(s): Metformin 500 milligrams with breakfast.  Lab Results  Component Value Date   HGBA1C 5.6 11/09/2021   HGBA1C 5.4 04/23/2021   HGBA1C 5.5 07/10/2020   Lab Results  Component Value Date   MICROALBUR <0.7 04/23/2021   [place] 75 11/09/2021   CREATININE 1.07 11/09/2021     Plan: Refill metformin 500 mg daily with breakfast -requests 90-day supply. Check labs next office visit.  2. Vitamin D Deficiency Vitamin D is at goal of 50.  Last vitamin D level was 72 on 11/09/2021. He is on weekly prescription Vitamin D 50,000 IU.  Lab Results  Component Value Date   VD25OH 72.17 11/09/2021   VD25OH 31.57 04/23/2021   VD25OH 47.1 07/10/2020    Plan: Hold vitamin D until we check level.   Vitamin D prescription sent to pharmacy but MyChart message sent advising patient to hold vitamin D. Check vitamin D level next office visit.   3. Obesity: Current BMI 35.2 Tony [name] is currently in the action stage of change. As such, his goal is to continue with weight loss efforts.  He has agreed to keeping a food journal and adhering to recommended goals of 1400-1600 calories and 120 gms protein.   Exercise goals: Resume walking and resistance training.  Behavioral modification strategies: increasing lean protein intake, decreasing simple carbohydrates, meal planning and cooking strategies, better snacking choices, planning for success, and keeping a strict food journal.  Encouraged him to journal consistently.  Recommended Lose It! or My Net Diary apps.  Tony [name] has agreed to follow-up with our clinic in 8 weeks.   No orders of the defined types were placed in this encounter.   Medications Discontinued During This Encounter  Medication Reason   metFORMIN (GLUCOPHAGE) 500 MG tablet Reorder   Vitamin D, Ergocalciferol, (DRISDOL) 1.25  MG (50000 UNIT) CAPS capsule Reorder     Meds ordered this encounter  Medications   metFORMIN (GLUCOPHAGE) 500 MG tablet    Sig: Take 1 tablet (500 mg total) by mouth daily with breakfast.    Dispense:  90 tablet    Refill:  0    Order Specific Question:   Supervising Provider    Answer:   Loyal Gambler E [[id number]]   Vitamin D, Ergocalciferol, (DRISDOL) 1.25 MG (50000 UNIT) CAPS capsule    Sig: TAKE ONE CAPSULE BY MOUTH EVERY 7 DAYS     Dispense:  12 capsule    Refill:  0    Order Specific Question:   Supervising Provider    Answer:   Dell Ponto [[id number]]      Objective:   VITALS: Per patient if applicable, see vitals. GENERAL: Alert and in no acute distress. CARDIOPULMONARY: No increased WOB. Speaking in clear sentences.  PSYCH: Pleasant and cooperative. Speech normal rate and rhythm. Affect is appropriate. Insight and judgement are appropriate. Attention is focused, linear, and appropriate.  NEURO: Oriented as arrived to appointment on time with no prompting.   Lab Results  Component Value Date   CREATININE 1.07 11/09/2021   BUN 23 11/09/2021   NA 138 11/09/2021   K 3.8 11/09/2021   CL 101 11/09/2021   CO2 28 11/09/2021   Lab Results  Component Value Date   ALT 23 11/09/2021   AST 22 11/09/2021   ALKPHOS 66 11/09/2021   BILITOT 0.5 11/09/2021   Lab Results  Component Value Date   HGBA1C 5.6 11/09/2021   HGBA1C 5.4 04/23/2021   HGBA1C 5.5 07/10/2020   HGBA1C 5.9 03/22/2020   HGBA1C 6.2 07/02/2019   Lab Results  Component Value Date   INSULIN 14.0 07/10/2020   INSULIN 35.9 (H) 04/13/2020   Lab Results  Component Value Date   TSH 3.38 04/23/2021   Lab Results  Component Value Date   CHOL 138 11/09/2021   HDL 41.10 11/09/2021   LDLCALC 75 11/09/2021   LDLDIRECT 157.0 07/02/2019   TRIG 108.0 11/09/2021   CHOLHDL 3 11/09/2021   Lab Results  Component Value Date   WBC 4.8 04/23/2021   HGB 14.6 04/23/2021   HCT 42.7 04/23/2021   MCV 94.4 04/23/2021   PLT 143.0 (L) 04/23/2021   No results found for: "IRON", "TIBC", "FERRITIN" Lab Results  Component Value Date   VD25OH 72.17 11/09/2021   VD25OH 31.57 04/23/2021   VD25OH 47.1 07/10/2020    Attestation Statements:   Reviewed by clinician on day of visit: allergies, medications, problem list, medical history, surgical history, family history, social history, and previous encounter notes.

## 2022-03-13 ENCOUNTER — Encounter (INDEPENDENT_AMBULATORY_CARE_PROVIDER_SITE_OTHER): Payer: Self-pay | Admitting: Family Medicine

## 2022-03-13 ENCOUNTER — Telehealth (INDEPENDENT_AMBULATORY_CARE_PROVIDER_SITE_OTHER): Payer: BC Managed Care – PPO | Admitting: Family Medicine

## 2022-03-13 ENCOUNTER — Other Ambulatory Visit: Payer: Self-pay | Admitting: Family Medicine

## 2022-03-13 DIAGNOSIS — Z7984 Long term (current) use of oral hypoglycemic drugs: Secondary | ICD-10-CM

## 2022-03-13 DIAGNOSIS — E1169 Type 2 diabetes mellitus with other specified complication: Secondary | ICD-10-CM

## 2022-03-13 DIAGNOSIS — E669 Obesity, unspecified: Secondary | ICD-10-CM

## 2022-03-13 DIAGNOSIS — Z6835 Body mass index (BMI) 35.0-35.9, adult: Secondary | ICD-10-CM

## 2022-03-13 DIAGNOSIS — E559 Vitamin D deficiency, unspecified: Secondary | ICD-10-CM

## 2022-03-13 MED ORDER — VITAMIN D (ERGOCALCIFEROL) 1.25 MG (50000 UNIT) PO CAPS: ORAL_CAPSULE | ORAL | 0 refills | Status: DC

## 2022-03-13 MED ORDER — METFORMIN HCL 500 MG PO TABS: 500.0000 mg | ORAL_TABLET | Freq: Every day | ORAL | 0 refills | Status: DC

## 2022-05-10 ENCOUNTER — Encounter: Payer: BC Managed Care – PPO | Admitting: Family Medicine

## 2022-05-15 ENCOUNTER — Telehealth (INDEPENDENT_AMBULATORY_CARE_PROVIDER_SITE_OTHER): Payer: Self-pay | Admitting: Family Medicine

## 2022-05-15 ENCOUNTER — Encounter (INDEPENDENT_AMBULATORY_CARE_PROVIDER_SITE_OTHER): Payer: Self-pay | Admitting: Family Medicine

## 2022-05-15 DIAGNOSIS — E669 Obesity, unspecified: Secondary | ICD-10-CM | POA: Insufficient documentation

## 2022-05-15 DIAGNOSIS — K5909 Other constipation: Secondary | ICD-10-CM

## 2022-05-15 DIAGNOSIS — Z6835 Body mass index (BMI) 35.0-35.9, adult: Secondary | ICD-10-CM

## 2022-05-15 DIAGNOSIS — Z7984 Long term (current) use of oral hypoglycemic drugs: Secondary | ICD-10-CM

## 2022-05-15 DIAGNOSIS — E1169 Type 2 diabetes mellitus with other specified complication: Secondary | ICD-10-CM

## 2022-05-15 MED ORDER — METFORMIN HCL 500 MG PO TABS: 500.0000 mg | ORAL_TABLET | Freq: Every day | ORAL | 0 refills | Status: DC

## 2022-05-15 NOTE — Progress Notes (Signed)
TeleHealth Visit:  This visit was completed with telemedicine (audio/video) technology. Tony [name] has verbally consented to this TeleHealth visit. The patient is located at home, the provider is located at home. The participants in this visit include the listed provider and patient. The visit was conducted today via MyChart video.  OBESITY Tony [name] is here to discuss his progress with his obesity treatment plan along with follow-up of his obesity related diagnoses.   Today's visit was # 25 Starting weight: 270 lbs Starting date: 04/13/2020 Weight at last in office visit: 238 lbs on 12/12/21 Total weight loss: 32 lbs at last in office visit on 12/12/21. Today's reported weight: none reported   Nutrition Plan: keeping a food journal and adhering to recommended goals of 1400-1600 calories and 120 protein.   Current exercise: walking for 27 minutes 7 days per week, twice 3 days per week. Lifts weights 3 days per week for 20 minutes.   Interim History:  Last in office visit was 12/12/2021.  Weight was 238 pounds.  He had a virtual visit on 03/13/2021. He has not weighed but feels his weight has remained the same. Breakfast is on plan and he journals it. He journals daily and gets at least 100 gms per day. Calories average 2100.  He finds it a challenge to meet his protein and calorie goals. He eats out a few days per week-especially during tax season, his wife is an Optometrist.  Drinks water and zero cal tea.  Has vegetables daily.  Protein intake adequate: yes  Skipping meals: Yes occasionally skips lunch. Drinking adequate water: Yes Drinking sugar sweetened beverages: No Hunger controlled: well controlled. Cravings controlled:  moderately controlled.  Journaling Consistently:  Yes Meeting protein goals:  Yes Meeting calorie goals:  No  He would like to do all virtual visits.  Advised him he will need to go to the clinic to weigh or weigh at home before his visits. He is  currently without health insurance coverage and paying out-of-pocket. Has appointment with PCP in June and will have labs done. Assessment/Plan:  1. Other constipation Pattern has been a BM every 2 to 3 days.  Taking MiraLAX with fair results, could be better.  Plan: Take MiraLAX twice daily. Continue adequate water and fiber.  2. Type 2 Diabetes Mellitus with other specified complication, without long-term current use of insulin HgbA1c is at goal. Last A1c was 5.6 on 11/09/2021. CBGs: Not checking Episodes of hypoglycemia: no Medication(s): Metformin 500 mg once daily breakfast On Crestor.  Lab Results  Component Value Date   HGBA1C 5.6 11/09/2021   HGBA1C 5.4 04/23/2021   HGBA1C 5.5 07/10/2020   Lab Results  Component Value Date   MICROALBUR <0.7 04/23/2021   [place] 75 11/09/2021   CREATININE 1.07 11/09/2021   Lab Results  Component Value Date   GFR 83.95 11/09/2021   GFR 84.27 04/23/2021   GFR 92.94 08/21/2020    Plan: Continue and refill Metformin 500 mg once daily breakfast   3. Generalized Obesity: Current BMI 36  Tony [name] is currently in the action stage of change. As such, his goal is to continue with weight loss efforts.  He has agreed to the Category 3 plan.  Exercise goals: As is.  Behavioral modification strategies: increasing lean protein intake, decreasing simple carbohydrates , no meal skipping, decrease eating out, meal planning , and planning for success.  Tony [name] has agreed to follow-up with our clinic in 8 weeks.   No orders of the defined types were placed  in this encounter.   Medications Discontinued During This Encounter  Medication Reason   metFORMIN (GLUCOPHAGE) 500 MG tablet Reorder     Meds ordered this encounter  Medications   metFORMIN (GLUCOPHAGE) 500 MG tablet    Sig: Take 1 tablet (500 mg total) by mouth daily with breakfast.    Dispense:  90 tablet    Refill:  0    Order Specific Question:   Supervising Provider    Answer:    Dell Ponto [[id number]]      Objective:   VITALS: Per patient if applicable, see vitals. GENERAL: Alert and in no acute distress. CARDIOPULMONARY: No increased WOB. Speaking in clear sentences.  PSYCH: Pleasant and cooperative. Speech normal rate and rhythm. Affect is appropriate. Insight and judgement are appropriate. Attention is focused, linear, and appropriate.  NEURO: Oriented as arrived to appointment on time with no prompting.   Attestation Statements:   Reviewed by clinician on day of visit: allergies, medications, problem list, medical history, surgical history, family history, social history, and previous encounter notes.  This was prepared with the assistance of Presenter, broadcasting.  Occasional wrong-word or sound-a-like substitutions may have occurred due to the inherent limitations of voice recognition software.

## 2022-06-01 ENCOUNTER — Other Ambulatory Visit (INDEPENDENT_AMBULATORY_CARE_PROVIDER_SITE_OTHER): Payer: Self-pay | Admitting: Family Medicine

## 2022-06-01 DIAGNOSIS — E559 Vitamin D deficiency, unspecified: Secondary | ICD-10-CM

## 2022-06-07 ENCOUNTER — Other Ambulatory Visit: Payer: Self-pay | Admitting: Family Medicine

## 2022-06-07 ENCOUNTER — Other Ambulatory Visit (INDEPENDENT_AMBULATORY_CARE_PROVIDER_SITE_OTHER): Payer: Self-pay | Admitting: Family Medicine

## 2022-06-07 DIAGNOSIS — E1169 Type 2 diabetes mellitus with other specified complication: Secondary | ICD-10-CM

## 2022-06-08 ENCOUNTER — Other Ambulatory Visit: Payer: Self-pay | Admitting: Family Medicine

## 2022-06-10 NOTE — Telephone Encounter (Signed)
Requesting: clonazepam Contract: 11/09/21 UDS: 11/09/21 Last Visit: 11/09/21 Next Visit: 07/31/22 Last Refill: 11/09/21 (270,1)  Please Advise. Med pending

## 2022-07-08 NOTE — Progress Notes (Deleted)
  TeleHealth Visit:  This visit was completed with telemedicine (audio/video) technology. Tony [name] has verbally consented to this TeleHealth visit. The patient is located at home, the provider is located at home. The participants in this visit include the listed provider and patient. The visit was conducted today via MyChart video.  OBESITY Tony [name] is here to discuss his progress with his obesity treatment plan along with follow-up of his obesity related diagnoses.   Today's visit was # 26 Starting weight: 270 lbs Starting date: 04/13/2020 Weight at last in office visit: 238 lbs on 12/12/21 Total weight loss: 32 lbs at last in office visit on 12/12/21. Today's reported weight (***): {dwwweightreported:29243}  Nutrition Plan: the Category 3 plan - ***% adherence.  Current exercise: {exercise types:16438} walking for 27 minutes 7 days per week, twice 3 days per week. Lifts weights 3 days per week for 20 minutes.   Interim History:  ***  Eating all of the prescribed protein: {yes***/no:17258} Skipping meals: {dwwyes:29172} Drinking adequate water: {dwwyes:29172} Drinking sugar sweetened beverages: {dwwyes:29172} Hunger controlled: {EWCONTROLASSESSMENT:24261}. Cravings controlled:  {EWCONTROLASSESSMENT:24261}.  Journaling Consistently:  {dwwyes:29172} Meeting protein goals:  {dwwyes:29172} Meeting calorie goals:  {dwwyes:29172}   Pharmacotherapy: Caylon is on {dwwpharmacotherapy:29109} Adverse side effects: {dwwse:29122} Hunger is {EWCONTROLASSESSMENT:24261}.  Cravings are {EWCONTROLASSESSMENT:24261}.  Assessment/Plan:  1. ***  2. ***  3. ***  {dwwmorbid:29108::"Morbid Obesity"}: Current BMI ***  Pharmacotherapy Plan {dwwmed:29123}  {dwwpharmacotherapy:29109}  [name] {CHL AMB IS/IS NOT:210130109} currently in the action stage of change. As such, his goal is to {MWMwtloss#1:210800005}.  He has agreed to {dwwsldiets:29085}.  Exercise goals: {MWM EXERCISE  RECS:23473}  Behavioral modification strategies: {dwwslwtlossstrategies:29088}.  [name] has agreed to follow-up with our clinic in {NUMBER 1-10:22536} weeks.   No orders of the defined types were placed in this encounter.   There are no discontinued medications.   No orders of the defined types were placed in this encounter.     Objective:   VITALS: Per patient if applicable, see vitals. GENERAL: Alert and in no acute distress. CARDIOPULMONARY: No increased WOB. Speaking in clear sentences.  PSYCH: Pleasant and cooperative. Speech normal rate and rhythm. Affect is appropriate. Insight and judgement are appropriate. Attention is focused, linear, and appropriate.  NEURO: Oriented as arrived to appointment on time with no prompting.   Attestation Statements:   Reviewed by clinician on day of visit: allergies, medications, problem list, medical history, surgical history, family history, social history, and previous encounter notes.  ***(delete if time-based billing not used) Time spent on visit including the items listed below was *** minutes.  -preparing to see the patient (e.g., review of tests, history, previous notes) -obtaining and/or reviewing separately obtained history -counseling and educating the patient/family/caregiver -documenting clinical information in the electronic or other health record -ordering medications, tests, or procedures -independently interpreting results and communicating results to the patient/ family/caregiver -referring and communicating with other health care professionals  -care coordination   This was prepared with the assistance of Engineer, civil (consulting).  Occasional wrong-word or sound-a-like substitutions may have occurred due to the inherent limitations of voice recognition software.

## 2022-07-09 ENCOUNTER — Telehealth (INDEPENDENT_AMBULATORY_CARE_PROVIDER_SITE_OTHER): Payer: Self-pay | Admitting: Family Medicine

## 2022-07-31 ENCOUNTER — Encounter: Payer: BC Managed Care – PPO | Admitting: Family Medicine

## 2022-08-21 NOTE — Progress Notes (Deleted)
  TeleHealth Visit:  This visit was completed with telemedicine (audio/video) technology. Halsey has verbally consented to this TeleHealth visit. The patient is located at home, the provider is located at home. The participants in this visit include the listed provider and patient. The visit was conducted today via MyChart video.  OBESITY Alcide is here to discuss his progress with his obesity treatment plan along with follow-up of his obesity related diagnoses.    Today's visit was # 26  Starting weight: 270 lbs Starting date: 04/13/20 Weight reported at last virtual office visit: no weight reported Today's reported weight (***): {dwwweightreported:29243} Weight at clinic on ***: *** lbs Total weight loss: *** lbs Weight change since last visit: *** lbs   Nutrition Plan: keeping a food journal and adhering to recommended goals of 1400-1600 calories and 120 protein.    Current exercise: walking for 27 minutes 7 days per week, twice 3 days per week. Lifts weights 3 days per week for 20 minutes.   Nutrition Plan: the Category 3 plan - ***% adherence.  Current exercise: {exercise types:16438} walking for 27 minutes 7 days per week, twice 3 days per week. Lifts weights 3 days per week for 20 minutes.   Interim History:  ***  Eating all of the prescribed protein: {yes***/no:17258} Skipping meals: {dwwyes:29172} Drinking adequate water: {dwwyes:29172} Drinking sugar sweetened beverages: {dwwyes:29172} Hunger controlled: {EWCONTROLASSESSMENT:24261}. Cravings controlled:  {EWCONTROLASSESSMENT:24261}.  Journaling Consistently:  {dwwyes:29172} Meeting protein goals:  {dwwyes:29172} Meeting calorie goals:  {dwwyes:29172}  Pharmacotherapy: Atsushi is on {dwwglp:29109}. Adverse side effects: {dwwse:29122} Hunger is {EWCONTROLASSESSMENT:24261}.  Assessment/Plan:  1. ***  2. ***  3. ***  {dwwmorbid:29108::"Morbid Obesity"}: Current BMI ***  Pharmacotherapy  Plan {dwwmed:29123} {dwwglp:29109}.   Aslan {CHL AMB IS/IS NOT:210130109} currently in the action stage of change. As such, his goal is to {MWMwtloss#1:210800005}.  He has agreed to {dwwsldiets:29085}.  Exercise goals: {MWM EXERCISE RECS:23473}  Behavioral modification strategies: {dwwslwtlossstrategies:29088}.  Rein has agreed to follow-up with our clinic in {NUMBER 1-10:22536} {dwwfutime:29619}  No orders of the defined types were placed in this encounter.   There are no discontinued medications.   No orders of the defined types were placed in this encounter.     Objective:   VITALS: Per patient if applicable, see vitals. GENERAL: Alert and in no acute distress. CARDIOPULMONARY: No increased WOB. Speaking in clear sentences.  PSYCH: Pleasant and cooperative. Speech normal rate and rhythm. Affect is appropriate. Insight and judgement are appropriate. Attention is focused, linear, and appropriate.  NEURO: Oriented as arrived to appointment on time with no prompting.   Attestation Statements:   Reviewed by clinician on day of visit: allergies, medications, problem list, medical history, surgical history, family history, social history, and previous encounter notes.  ***(delete if time-based billing not used) Time spent on visit including the items listed below was *** minutes.  -preparing to see the patient (e.g., review of tests, history, previous notes) -obtaining and/or reviewing separately obtained history -counseling and educating the patient/family/caregiver -documenting clinical information in the electronic or other health record -ordering medications, tests, or procedures -independently interpreting results and communicating results to the patient/ family/caregiver -referring and communicating with other health care professionals  -care coordination    This was prepared with the assistance of [employer].  Occasional wrong-word or sound-a-like substitutions  may have occurred due to the inherent limitations of voice recognition software.

## 2022-08-22 ENCOUNTER — Telehealth (INDEPENDENT_AMBULATORY_CARE_PROVIDER_SITE_OTHER): Payer: Self-pay | Admitting: Family Medicine

## 2022-08-22 DIAGNOSIS — E669 Obesity, unspecified: Secondary | ICD-10-CM

## 2022-08-22 NOTE — Telephone Encounter (Signed)
Patient was a no-show for virtual visit.  Waited on the virtual visit until 6 minutes past appointment time per office policy.  Left VM advising patient to call to reschedule.  

## 2022-09-04 ENCOUNTER — Other Ambulatory Visit: Payer: Self-pay | Admitting: Family Medicine

## 2022-09-05 ENCOUNTER — Other Ambulatory Visit: Payer: Self-pay | Admitting: Family Medicine

## 2022-09-11 ENCOUNTER — Other Ambulatory Visit: Payer: Self-pay | Admitting: Family Medicine

## 2022-09-11 NOTE — Telephone Encounter (Signed)
15-day supply prescribed. Patient is quite a bit overdue for follow-up office visit.

## 2022-09-14 IMAGING — DX DG CHEST 1V PORT
1 series · 1 of 1 positions shown · non-contrast
Comparison: Chest radiographs 05/06/2017 and earlier.

CLINICAL DATA: 44-year-old male with fever, chills and body aches
since 7555 hours yesterday.

EXAM:
PORTABLE CHEST 1 VIEW

[chest]
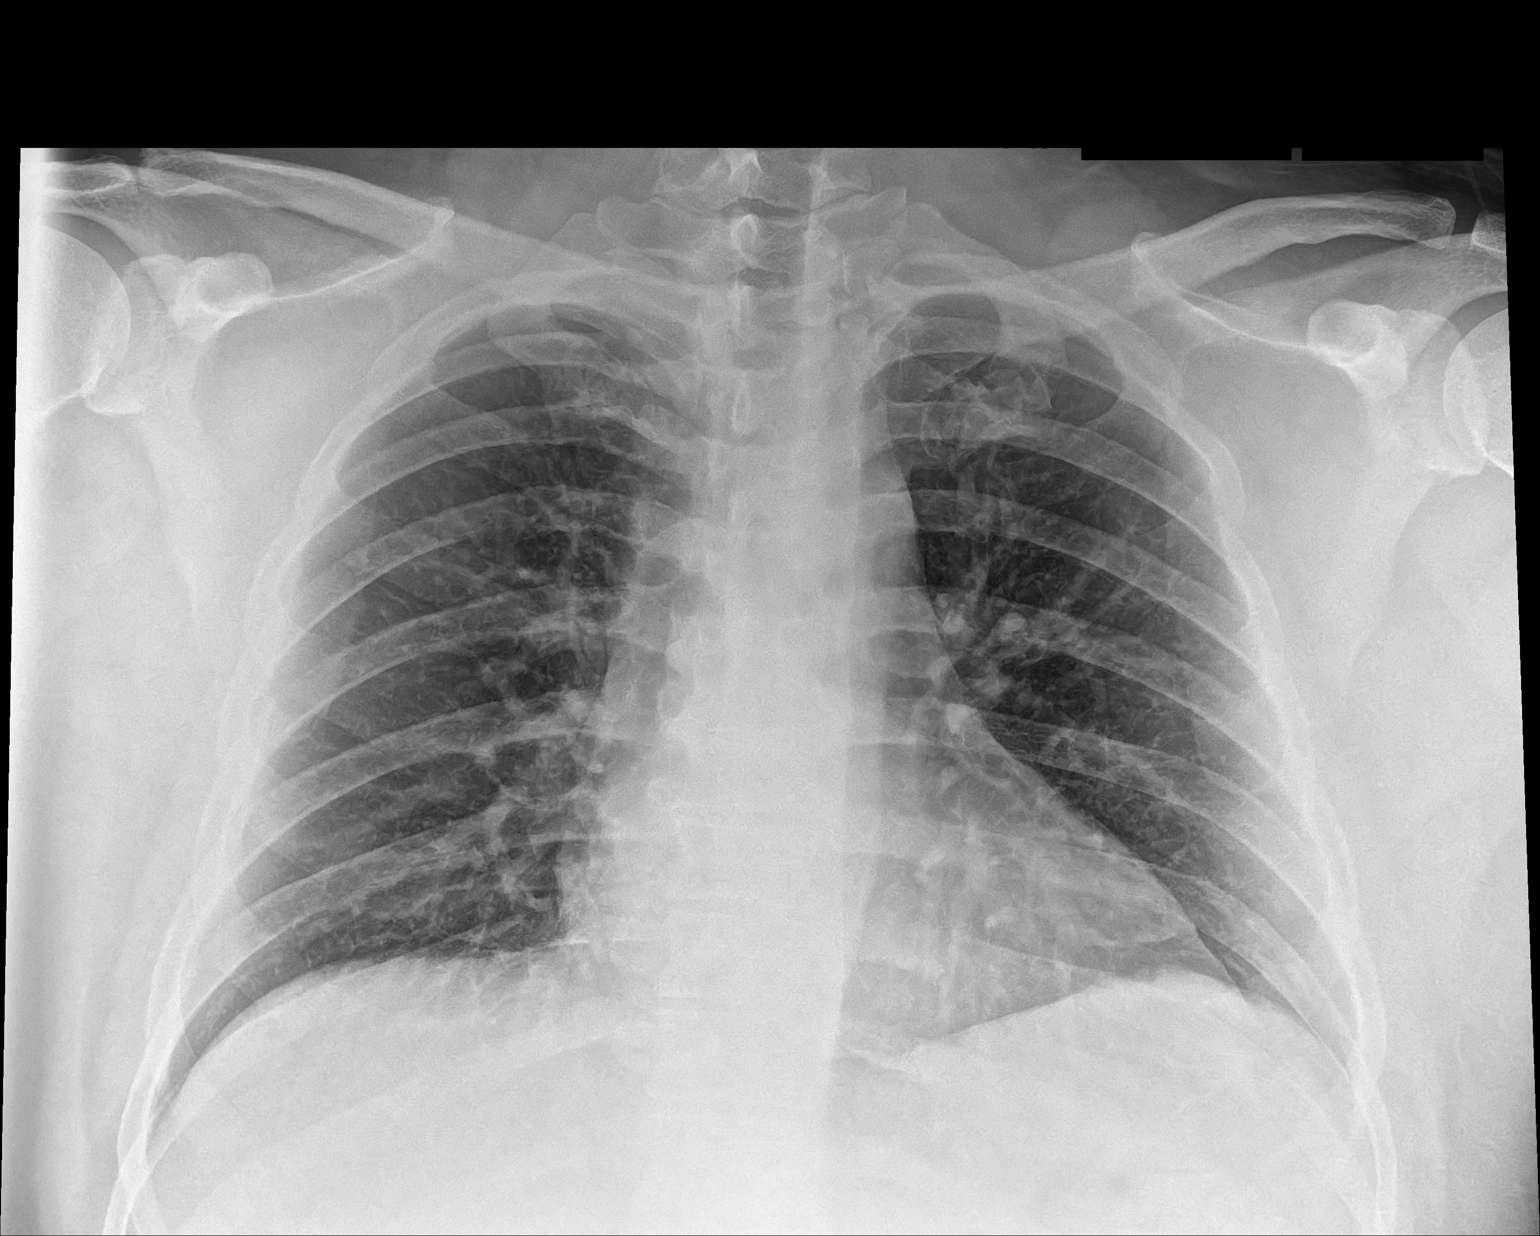

[1 of 1 positions shown; findings below may reference images not displayed]

FINDINGS: Portable AP upright view at 4459 hours. Lung volumes and mediastinal
contours are normal. Visualized tracheal air column is within normal
limits. Allowing for portable technique the lungs are clear. No
pneumothorax or pleural effusion. No osseous abnormality identified.
IMPRESSION: Negative portable chest.

## 2022-09-15 IMAGING — XA Imaging study
2 series · 2 of 2 positions shown · non-contrast
Comparison: none

CLINICAL DATA: Lumbosacral spondylosis without myelopathy. Low back
pain and lateral thigh numbness bilaterally. 90% relief from prior
epidural injection on 05/25/2020. Pain is now worse on the left
side.

[Series 1: ortho standard · 1 of 1 slices shown (1 of 2)]
[im 1/1]
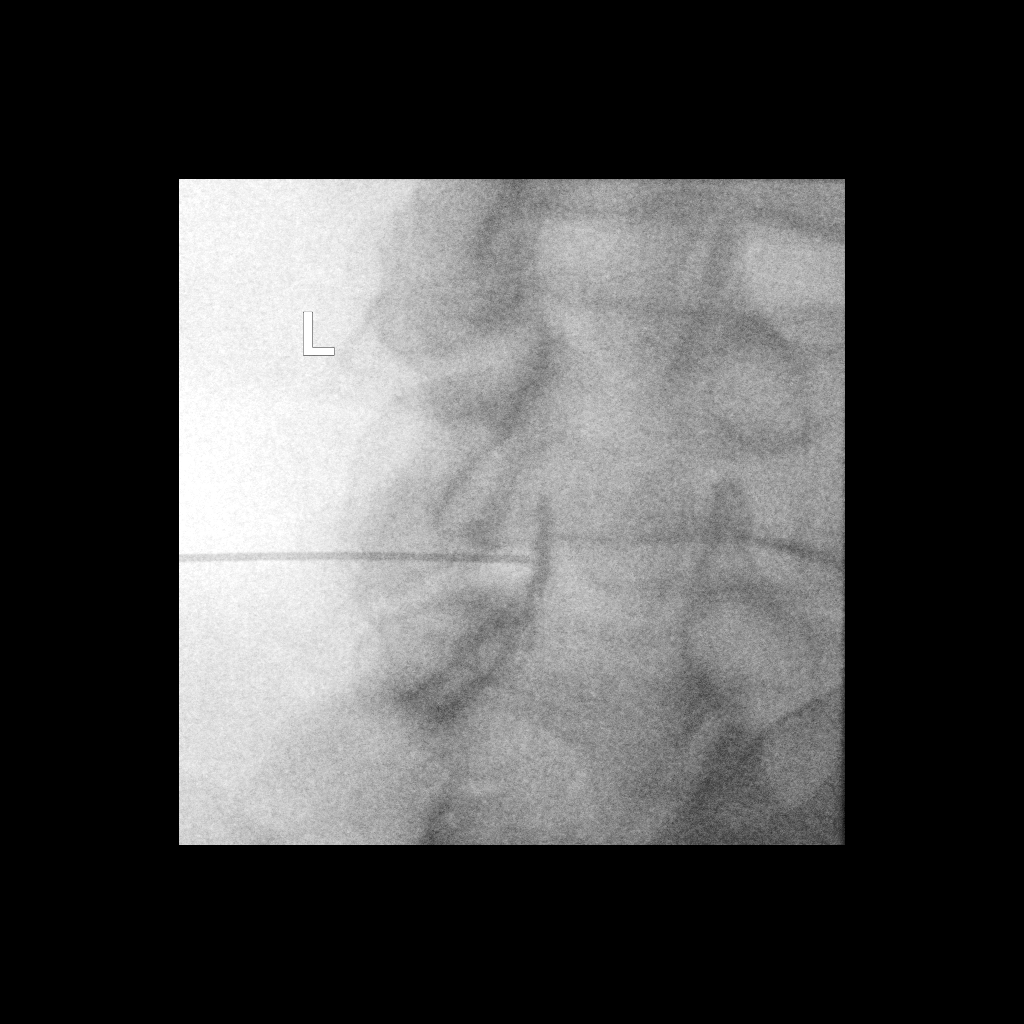

[Series 2: ortho standard · 1 of 1 slices shown (2 of 2)]
[im 1/1]
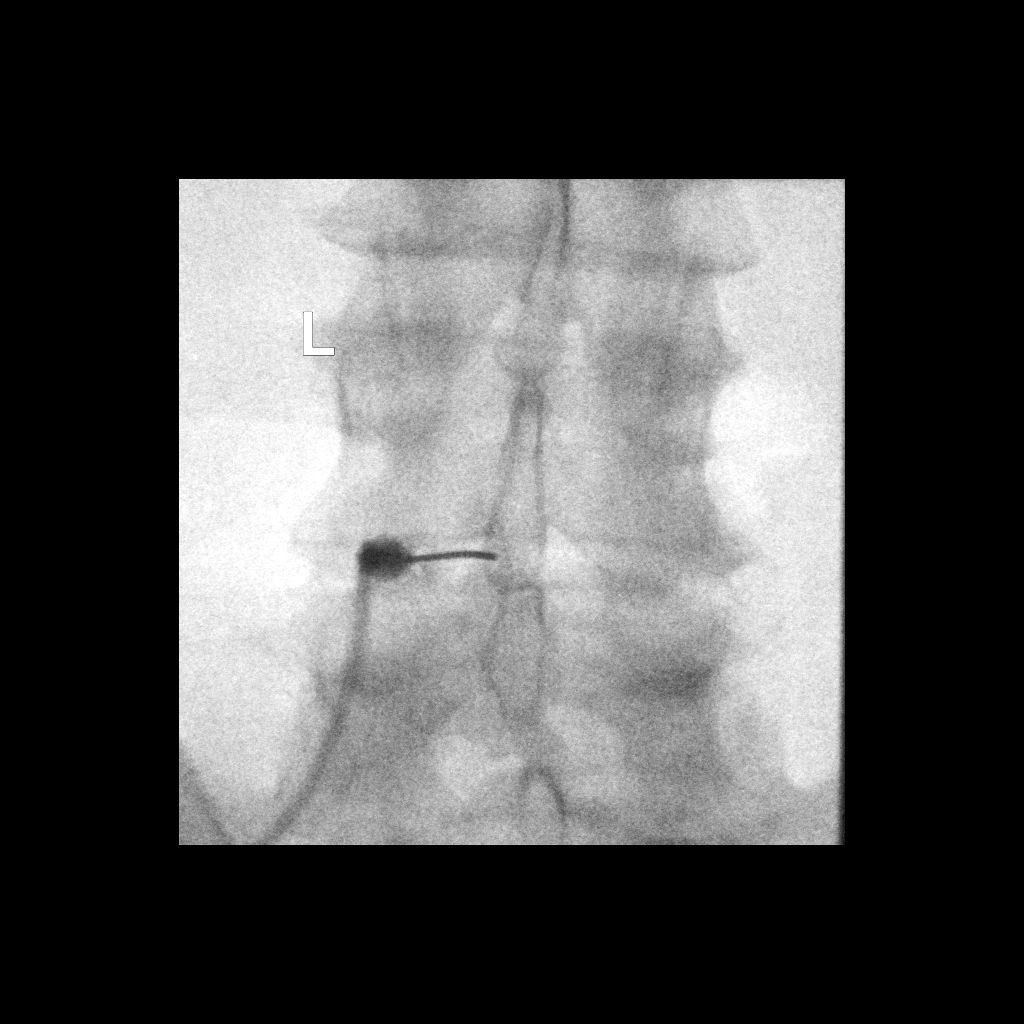

[2 of 2 positions shown; findings below may reference images not displayed]

FLUOROSCOPY TIME:  Radiation Exposure Index (as provided by the
fluoroscopic device): 3.4 mGy

Fluoroscopy Time:  21 seconds

Number of Acquired Images:  0

PROCEDURE:
The procedure, risks, benefits, and alternatives were explained to
the patient. Questions regarding the procedure were encouraged and
answered. The patient understands and consents to the procedure.

LUMBAR EPIDURAL INJECTION:

An interlaminar approach was performed on the left at L4-L5. The
overlying skin was cleansed and anesthetized. A 3.5 inch 20 gauge
epidural needle was advanced using loss-of-resistance technique.

DIAGNOSTIC EPIDURAL INJECTION:

Injection of Isovue-M 200 shows a good epidural pattern with spread
above and below the level of needle placement, primarily on the
left. No vascular opacification is seen.

THERAPEUTIC EPIDURAL INJECTION:

80 mg of Depo-Medrol mixed with 3 mL of 1% lidocaine were instilled.
The procedure was well-tolerated, and the patient was discharged
thirty minutes following the injection in good condition.

COMPLICATIONS:
None immediate.
IMPRESSION: Technically successful interlaminar epidural injection on the left
at L4-L5.

## 2022-09-24 ENCOUNTER — Other Ambulatory Visit: Payer: Self-pay | Admitting: Family Medicine

## 2022-09-25 ENCOUNTER — Encounter (INDEPENDENT_AMBULATORY_CARE_PROVIDER_SITE_OTHER): Payer: Self-pay

## 2022-09-25 NOTE — Telephone Encounter (Signed)
Pt scheduled for 08/21. Last RF 7/17 (45,0)  Please advise if refill approved or denied.

## 2022-10-16 ENCOUNTER — Encounter: Payer: Self-pay | Admitting: Family Medicine

## 2022-11-19 ENCOUNTER — Encounter: Payer: Self-pay | Admitting: Family Medicine

## 2022-11-19 ENCOUNTER — Other Ambulatory Visit: Payer: Self-pay | Admitting: Family Medicine

## 2022-11-19 ENCOUNTER — Telehealth (INDEPENDENT_AMBULATORY_CARE_PROVIDER_SITE_OTHER): Payer: Self-pay | Admitting: Family Medicine

## 2022-11-19 VITALS — Temp 98.2°F

## 2022-11-19 DIAGNOSIS — R5383 Other fatigue: Secondary | ICD-10-CM

## 2022-11-19 DIAGNOSIS — U071 COVID-19: Secondary | ICD-10-CM

## 2022-11-19 DIAGNOSIS — R630 Anorexia: Secondary | ICD-10-CM

## 2022-11-19 MED ORDER — NIRMATRELVIR/RITONAVIR (PAXLOVID)TABLET: 3.0000 | ORAL_TABLET | Freq: Two times a day (BID) | ORAL | 0 refills | Status: AC

## 2022-11-19 NOTE — Progress Notes (Signed)
Virtual Visit via Video Note  I connected with Tony [name]  on 11/19/22 at  8:00 AM EDT by a video enabled telemedicine application and verified that I am speaking with the correct person using two identifiers.  Location patient: Tony [name] Location provider:work or home office Persons participating in the virtual visit: patient, provider  I discussed the limitations and requested verbal permission for telemedicine visit. The patient expressed understanding and agreed to proceed.   HPI: 47 year old male being seen today for generalized fatigue. Onset 3 days ago, significantly fatigued.  He feels a bit winded with walking but feels like this is just because he is so tired.  He does not have an appetite.  He is not throwing up or having abdominal pain.   No nasal congestion, sore throat, or cough. His home COVID test is positive. His wife currently has COVID infection as well.  PMP AWARE reviewed today: most recent rx for clonazepam was filled 09/27/22, # 90, rx by me.  No red flags.  ROS: See pertinent positives and negatives per HPI.  Past Medical History:  Diagnosis Date   Alcohol abuse 2016/2017   Pt states he was self medicating his anxiety.  Quit 04/2015.  Minimal intake as of 05/2017 f/u.   Anal fissure    Anxiety and depression    Back pain    Borderline diabetes    Erectile dysfunction    cialis 5mg  qd-qod started by urol 09/2020   Hepatic steatosis 2015; 2016; 02/2016   +Alcohol has played a role.  CT abd showed fatty liver+ liver enzymes mildly elevated (chron viral hepatitis screening NEG).  Abd u/s 03/26/16 showed fatty liver, small GB polyps, o/w normal.   History of kidney stones    [facility]   HTN (hypertension)    Hx of colonic polyp - ssp 02/11/2014   No polyps 02/2020->recall 10 yrs   Hyperlipidemia    Statin started 05/2018   Insomnia    Obesity, Class II, BMI 35-39.9    Psoriasis    Secondary male hypogonadism    clomiphene 06/2019: normalized on clomiphene 08/2019    Shortness of breath on exertion     Past Surgical History:  Procedure Laterality Date   APPENDECTOMY     COLONOSCOPY  03/10/2020   No polyps. Recall 2032.   COLONOSCOPY W/ BIOPSIES  02/03/14   Cecal polyp (sessile serrated polyp w/out dysplasia) and posterior anal fissure; repeat TCS 2020 per Dr. Leone Payor   HAND SURGERY Left 2001   broke left pinkie finger   LAPAROSCOPIC APPENDECTOMY N/A 02/24/2015   Procedure: APPENDECTOMY LAPAROSCOPIC;  Surgeon: Ovidio Kin, MD;  Location: [facility];  Service: General;  Laterality: N/A;   right ankle     2014     Current Outpatient Medications:    brompheniramine-pseudoephedrine-DM 30-2-10 MG/5ML syrup, Take 5 mLs by mouth 4 (four) times daily as needed., Disp: 120 mL, Rfl: 0   carbamazepine (TEGRETOL) 100 MG chewable tablet, TAKE 1 TABLET BY MOUTH IN THE MORNING THEN TAKE 2 TABLETS BY MOUTH AT BEDTIME, Disp: , Rfl:    clonazePAM (KLONOPIN) 1 MG tablet, TAKE 1 TABLET BY MOUTH 3 TIMES A DAY AS NEEDED FOR ANXIETY **PLEASE SCHEDULE OFFICE VISIT**, Disp: 90 tablet, Rfl: 0   Eszopiclone 3 MG TABS, TAKE ONE TABLET BY MOUTH EVERY NIGHT IMMEDIATELY BEFORE BEDTIME, Disp: 90 tablet, Rfl: 1   fluticasone (FLONASE) 50 MCG/ACT nasal spray, Place 2 sprays into both nostrils daily., Disp: 16 g, Rfl: 0   gabapentin (NEURONTIN)  300 MG capsule, TAKE 1 CAPSULE BY MOUTH AT BEDTIME, Disp: 90 capsule, Rfl: 0   meloxicam (MOBIC) 15 MG tablet, TAKE ONE TABLET BY MOUTH DAILY AS NEEDED FOR PAIN, Disp: 30 tablet, Rfl: 1   metFORMIN (GLUCOPHAGE) 500 MG tablet, TAKE 1 TABLET BY MOUTH DAILY WITH BREAKFAST, Disp: 90 tablet, Rfl: 0   methocarbamol (ROBAXIN-750) 750 MG tablet, Take 1 tablet (750 mg total) by mouth 4 (four) times daily., Disp: 30 tablet, Rfl: 3   omeprazole (PRILOSEC) 40 MG capsule, Take 1 capsule (40 mg total) by mouth daily., Disp: 90 capsule, Rfl: 3   ondansetron (ZOFRAN) 4 MG tablet, Take 1 tablet (4 mg total) by mouth every 8 (eight) hours as needed for nausea or  vomiting., Disp: 20 tablet, Rfl: 0   polyethylene glycol powder (GLYCOLAX/MIRALAX) 17 GM/SCOOP powder, 17 gram twice daily until stooling regularly, then once daily and prn, Disp: 3350 g, Rfl: 1   propranolol (INDERAL) 10 MG tablet, Take 10 mg by mouth 2 (two) times daily., Disp: , Rfl:    rosuvastatin (CRESTOR) 5 MG tablet, TAKE 1 TABLET BY MOUTH DAILY, Disp: 90 tablet, Rfl: 0   sertraline (ZOLOFT) 25 MG tablet, Take 12.5 mg by mouth daily., Disp: , Rfl:    Vitamin D, Ergocalciferol, (DRISDOL) 1.25 MG (50000 UNIT) CAPS capsule, TAKE ONE CAPSULE BY MOUTH EVERY 7 DAYS, Disp: 12 capsule, Rfl: 0   nirmatrelvir/ritonavir (PAXLOVID) 20 x 150 MG & 10 x 100MG  TABS, Take 3 tablets by mouth 2 (two) times daily for 5 days. Take nirmatrelvir (150 mg) two tablets twice daily for 5 days and ritonavir (100 mg) one tablet twice daily for 5 days., Disp: 30 tablet, Rfl: 0  EXAM:  VITALS per patient if applicable:     12/18/2021   12:41 PM 12/12/2021    1:00 PM 11/14/2021    9:00 AM  Vitals with BMI  Height 5\' 9"  5\' 9"  5\' 9"   Weight 248 lbs 238 lbs 242 lbs  BMI 36.61 35.13 35.72  Systolic 124 120 409  Diastolic 84 79 67  Pulse 78 70 61     GENERAL: alert, oriented, appears well and in no acute distress  HEENT: atraumatic, conjunttiva clear, no obvious abnormalities on inspection of external nose and ears  NECK: normal movements of the head and neck  LUNGS: on inspection no signs of respiratory distress, breathing rate appears normal, no obvious gross SOB, gasping or wheezing  CV: no obvious cyanosis  MS: moves all visible extremities without noticeable abnormality  PSYCH/NEURO: pleasant and cooperative, no obvious depression or anxiety, speech and thought processing grossly intact  LABS: none today    Chemistry      Component Value Date/Time   NA 138 11/09/2021 0927   NA 139 04/13/2020 0957   K 3.8 11/09/2021 0927   CL 101 11/09/2021 0927   CO2 28 11/09/2021 0927   BUN 23 11/09/2021  0927   BUN 13 04/13/2020 0957   CREATININE 1.07 11/09/2021 0927      Component Value Date/Time   CALCIUM 9.2 11/09/2021 0927   ALKPHOS 66 11/09/2021 0927   AST 22 11/09/2021 0927   ALT 23 11/09/2021 0927   BILITOT 0.5 11/09/2021 0927   BILITOT 0.3 04/13/2020 0957     Lab Results  Component Value Date   HGBA1C 5.6 11/09/2021   ASSESSMENT AND PLAN:  Discussed the following assessment and plan:  COVID-19 virus infection  Other fatigue  Decreased appetite  Patient's risk factor for complication  from COVID infection is obesity, BMI 36. GFR 84, no hx of renal impairment. Paxlovid prescribed today.   Of note, he is overdue for routine follow-up/CPE.   I discussed the assessment and treatment plan with the patient. The patient was provided an opportunity to ask questions and all were answered. The patient agreed with the plan and demonstrated an understanding of the instructions.   F/u: 4-5d if not signi imp  Signed:  Santiago Bumpers, MD           11/19/2022

## 2022-11-21 ENCOUNTER — Other Ambulatory Visit: Payer: Self-pay | Admitting: Family Medicine

## 2022-11-21 NOTE — Telephone Encounter (Signed)
15.  Prescribed. Patient overdue for appropriate follow-up for this.

## 2022-11-21 NOTE — Telephone Encounter (Signed)
Requesting: Eszopiclone 3 MG TABS  Contract: 11/07/21 UDS: 11/09/21 Last Visit: 11/19/22 Next Visit: N/A Last Refill: 11/09/21 (90,1)  Please Advise. Med pending

## 2022-11-21 NOTE — Telephone Encounter (Signed)
#  15 rx'd. Pt is overdue for appropriate follow up for this.

## 2022-11-21 NOTE — Telephone Encounter (Signed)
Requesting: clonazePAM (KLONOPIN) 1 MG tablet  Contract: 11/09/21 UDS: 11/09/21 Last Visit: 11/19/22 Next Visit: N/A Last Refill: 09/25/22 (90,0)  Please Advise. Med pending

## 2022-11-22 ENCOUNTER — Telehealth: Payer: Self-pay

## 2022-11-22 NOTE — Telephone Encounter (Signed)
RF request for metFORMIN (GLUCOPHAGE) 500 MG tablet  LOV: 05/15/22 Next ov: N/A Last written: 06/10/22 (90,0)

## 2022-11-26 ENCOUNTER — Telehealth: Payer: Self-pay | Admitting: Family Medicine

## 2022-11-26 NOTE — Telephone Encounter (Signed)
Prescription Request  11/26/2022  LOV: Visit date not found Patient is scheduled for 01/16/23. He is requesting his medications have the normal refill amount. He believes his appointment for Covid served as the appointment for medication refills.   What is the name of the medication or equipment? clonazePAM (KLONOPIN) 1 MG tablet , Eszopiclone 3 MG TABS   Have you contacted your pharmacy to request a refill? Yes   Which pharmacy would you like this sent to?  [facility] [id number] [place], [place] - [address] [place] [address] Phone: [phone] Fax: [phone]  [id number] [place], [place] - [address] [place] [address] Phone: [phone] Fax: [phone]  [id number] [place], [place] - [address] [place] [address] Phone: [phone] Fax: [phone]    Patient notified that their request is being sent to the clinical staff for review and that they should receive a response within 2 business days.   Please advise at Mobile [phone] (mobile)

## 2022-11-27 NOTE — Telephone Encounter (Signed)
No, his COVID virtual visit did not account for anything other than addressing his acute illness. I must see any patient who I prescribe a controlled substance for a minimum of every 6 months. It looks like I have not seen him in a year. I absolutely must see him before any further prescriptions are done.  These are simply the rules we must follow.

## 2022-11-28 NOTE — Telephone Encounter (Signed)
Pt advised of provider recommendations.

## 2022-12-06 ENCOUNTER — Telehealth: Payer: Self-pay

## 2022-12-06 NOTE — Telephone Encounter (Signed)
RF request for metFORMIN (GLUCOPHAGE) 500 MG tablet  LOV: 11/19/22, acute Next ov: 01/16/23 CPE Last written: 06/10/22 (90,0) previously prescribed by Adah Salvage FNP

## 2022-12-08 NOTE — Telephone Encounter (Signed)
This med is rx'd by Adah Salvage, FNP. Notify him that he should request this refill through her office.

## 2022-12-09 ENCOUNTER — Telehealth (INDEPENDENT_AMBULATORY_CARE_PROVIDER_SITE_OTHER): Payer: Self-pay | Admitting: Family Medicine

## 2022-12-09 NOTE — Telephone Encounter (Signed)
Pt advised of medication recommendations.

## 2022-12-09 NOTE — Telephone Encounter (Signed)
Patient called in today stating his pharmacy has been sending refill requests to [place] but his Metformin has not been rescheduled. I explained to him it was because he hasn't been seen since March. We scheduled a follow up visit for November 4th because that is the soonest he can come in. After scheduling,I realized this patient has not been seen in over 6 months and will need to pay the PF again to be scheduled.  Thank you

## 2022-12-19 ENCOUNTER — Telehealth: Payer: Self-pay | Admitting: Family Medicine

## 2022-12-19 MED ORDER — ROSUVASTATIN CALCIUM 5 MG PO TABS: 5.0000 mg | ORAL_TABLET | Freq: Every day | ORAL | 0 refills | Status: DC

## 2022-12-19 NOTE — Telephone Encounter (Signed)
Prescription Request  12/19/2022  LOV: Visit date not found Has upcoming appointment 01/16/23 needs enough until this appointment   What is the name of the medication or equipment? rosuvastatin (CRESTOR) 5 MG tablet   Have you contacted your pharmacy to request a refill? Yes  [facility] [address] [facility] pharmacy of choice   Which pharmacy would you like this sent to?  [facility] [id number] [place] Otto, [place] - [address] [place] [address] Phone: [phone] Fax: [phone]  [id number] [place], [place] - [address] [place] [address] Phone: [phone] Fax: [phone]  [id number] [place], [place] - [address] [place] [address] Phone: [phone] Fax: [phone]    Patient notified that their request is being sent to the clinical staff for review and that they should receive a response within 2 business days.   Please advise at Mobile [phone] (mobile)

## 2022-12-30 ENCOUNTER — Ambulatory Visit (INDEPENDENT_AMBULATORY_CARE_PROVIDER_SITE_OTHER): Payer: Self-pay | Admitting: Adult Health

## 2023-01-13 ENCOUNTER — Other Ambulatory Visit: Payer: Self-pay | Admitting: Family Medicine

## 2023-01-15 NOTE — Progress Notes (Deleted)
Office Note 01/15/2023  CC: No chief complaint on file.   Patient is a 47 y.o. male who is here for annual health maintenance exam and f/u anxiety, insomnia, prediab, and HLD. I last saw him 11/09/21. A/P as of that visit: "#1 prediabetes.  Doing well on metformin 500 mg every morning. Fasting glucose and hemoglobin A1c today. Making improvements on diet and exercise.   #2 hyperlipidemia. Doing well on rosuvastatin 5 mg a day. Lipid panel and hepatic panel today.   3.  GAD.  Stable on Zoloft 12.5 mg a day and clonazepam 1 mg 3 times daily. Urine drug screen today.  Controlled substance contract updated.   #4 insomnia.  Does well with eszopiclone 3 mg nightly and takes this nearly every night.   #5 vitamin D deficiency.  He continues on 50,000 unit pill every week. Vitamin D level today."  INTERIM HX: ***  PMP AWARE reviewed today: most recent rx for clonazepam was filled 09/27/22, #90, rx by me. Most recent rx for lunesta was filled 03/26/22, #90, rx by me. No red flags.  Past Medical History:  Diagnosis Date   Alcohol abuse 2016/2017   Pt states he was self medicating his anxiety.  Quit 04/2015.  Minimal intake as of 05/2017 f/u.   Anal fissure    Anxiety and depression    Back pain    Borderline diabetes    Erectile dysfunction    cialis 5mg  qd-qod started by urol 09/2020   Hepatic steatosis 2015; 2016; 02/2016   +Alcohol has played a role.  CT abd showed fatty liver+ liver enzymes mildly elevated (chron viral hepatitis screening NEG).  Abd u/s 03/26/16 showed fatty liver, small GB polyps, o/w normal.   History of kidney stones    [facility]   HTN (hypertension)    Hx of colonic polyp - ssp 02/11/2014   No polyps 02/2020->recall 10 yrs   Hyperlipidemia    Statin started 05/2018   Insomnia    Obesity, Class II, BMI 35-39.9    Psoriasis    Secondary male hypogonadism    clomiphene 06/2019: normalized on clomiphene 08/2019   Shortness of breath on exertion      Past Surgical History:  Procedure Laterality Date   APPENDECTOMY     COLONOSCOPY  03/10/2020   No polyps. Recall 2032.   COLONOSCOPY W/ BIOPSIES  02/03/14   Cecal polyp (sessile serrated polyp w/out dysplasia) and posterior anal fissure; repeat TCS 2020 per Dr. Leone Payor   HAND SURGERY Left 2001   broke left pinkie finger   LAPAROSCOPIC APPENDECTOMY N/A 02/24/2015   Procedure: APPENDECTOMY LAPAROSCOPIC;  Surgeon: Ovidio Kin, MD;  Location: [facility];  Service: General;  Laterality: N/A;   right ankle     2014    Family History  Problem Relation Age of Onset   Diabetes Mother    Restless legs syndrome Mother    Heart disease Mother    Heart attack Mother 39   Hypertension Mother    Hyperlipidemia Mother    Depression Mother    Anxiety disorder Mother    Sleep apnea Mother    Psoriasis Father    Sleep apnea Father    Stroke Neg Hx    Cancer Neg Hx    Colon cancer Neg Hx    Colon polyps Neg Hx    Esophageal cancer Neg Hx    Rectal cancer Neg Hx    Stomach cancer Neg Hx     Social History  Socioeconomic History   Marital status: Married    Spouse name: Asher Muir   Number of children: Not on file   Years of education: Not on file   Highest education level: Not on file  Occupational History   Occupation: Investment banker, corporate  Tobacco Use   Smoking status: Never   Smokeless tobacco: Never  Vaping Use   Vaping status: Never Used  Substance and Sexual Activity   Alcohol use: Not Currently    Alcohol/week: 0.0 standard drinks of alcohol    Comment: 2019 stopped alcohol   Drug use: No   Sexual activity: Yes    Partners: Female    Birth control/protection: None  Other Topics Concern   Not on file  Social History Narrative   Married (spring 2019), no children.   Orig from [place].   Occup: Therapist, sports.   No tobacco.   Alcohol: hx of "heavy drinking".  Cut back as of 04/2015.   Social Determinants of Health   Financial Resource Strain: Not on file  Food  Insecurity: No Food Insecurity (10/04/2021)   Hunger Vital Sign    Worried About Running Out of Food in the Last Year: Never true    Ran Out of Food in the Last Year: Never true  Transportation Needs: Not on file  Physical Activity: Not on file  Stress: Not on file  Social Connections: Not on file  Intimate Partner Violence: Not on file    Outpatient Medications Prior to Visit  Medication Sig Dispense Refill   brompheniramine-pseudoephedrine-DM 30-2-10 MG/5ML syrup Take 5 mLs by mouth 4 (four) times daily as needed. 120 mL 0   carbamazepine (TEGRETOL) 100 MG chewable tablet TAKE 1 TABLET BY MOUTH IN THE MORNING THEN TAKE 2 TABLETS BY MOUTH AT BEDTIME     clonazePAM (KLONOPIN) 1 MG tablet TAKE 1 TABLET BY MOUTH DAILY AS NEEDED FOR ANXIETY **PLEASE SCHEDULE OFFICE VISIT** 15 tablet 0   Eszopiclone 3 MG TABS TAKE ONE TABLET BY MOUTH EVERY EVENING IMMEDIATLEY  BEFORE BEDTIME.  Needs follow up prior to any further refills. 15 tablet 0   fluticasone (FLONASE) 50 MCG/ACT nasal spray Place 2 sprays into both nostrils daily. 16 g 0   gabapentin (NEURONTIN) 300 MG capsule TAKE 1 CAPSULE BY MOUTH AT BEDTIME 90 capsule 0   meloxicam (MOBIC) 15 MG tablet TAKE 1 TABLET BY MOUTH DAILY AS NEEDED FOR PAIN 30 tablet 0   metFORMIN (GLUCOPHAGE) 500 MG tablet TAKE 1 TABLET BY MOUTH DAILY WITH BREAKFAST 90 tablet 0   methocarbamol (ROBAXIN-750) 750 MG tablet Take 1 tablet (750 mg total) by mouth 4 (four) times daily. 30 tablet 3   omeprazole (PRILOSEC) 40 MG capsule Take 1 capsule (40 mg total) by mouth daily. 90 capsule 3   ondansetron (ZOFRAN) 4 MG tablet Take 1 tablet (4 mg total) by mouth every 8 (eight) hours as needed for nausea or vomiting. 20 tablet 0   polyethylene glycol powder (GLYCOLAX/MIRALAX) 17 GM/SCOOP powder 17 gram twice daily until stooling regularly, then once daily and prn 3350 g 1   propranolol (INDERAL) 10 MG tablet Take 10 mg by mouth 2 (two) times daily.     rosuvastatin (CRESTOR) 5 MG  tablet TAKE 1 TABLET BY MOUTH DAILY 30 tablet 0   sertraline (ZOLOFT) 25 MG tablet Take 12.5 mg by mouth daily.     Vitamin D, Ergocalciferol, (DRISDOL) 1.25 MG (50000 UNIT) CAPS capsule TAKE ONE CAPSULE BY MOUTH EVERY 7 DAYS 12 capsule 0  No facility-administered medications prior to visit.    No Known Allergies  Review of Systems *** PE;    12/18/2021   12:41 PM 12/12/2021    1:00 PM 11/14/2021    9:00 AM  Vitals with BMI  Height 5\' 9"  5\' 9"  5\' 9"   Weight 248 lbs 238 lbs 242 lbs  BMI 36.61 35.13 35.72  Systolic 124 120 846  Diastolic 84 79 67  Pulse 78 70 61     *** Pertinent labs:  Lab Results  Component Value Date   TSH 3.38 04/23/2021   Lab Results  Component Value Date   WBC 4.8 04/23/2021   HGB 14.6 04/23/2021   HCT 42.7 04/23/2021   MCV 94.4 04/23/2021   PLT 143.0 (L) 04/23/2021   Lab Results  Component Value Date   CREATININE 1.07 11/09/2021   BUN 23 11/09/2021   NA 138 11/09/2021   K 3.8 11/09/2021   CL 101 11/09/2021   CO2 28 11/09/2021   Lab Results  Component Value Date   ALT 23 11/09/2021   AST 22 11/09/2021   ALKPHOS 66 11/09/2021   BILITOT 0.5 11/09/2021   Lab Results  Component Value Date   CHOL 138 11/09/2021   Lab Results  Component Value Date   HDL 41.10 11/09/2021   Lab Results  Component Value Date   LDLCALC 75 11/09/2021   Lab Results  Component Value Date   TRIG 108.0 11/09/2021   Lab Results  Component Value Date   CHOLHDL 3 11/09/2021   Lab Results  Component Value Date   HGBA1C 5.6 11/09/2021   ASSESSMENT AND PLAN:   No problem-specific Assessment & Plan notes found for this encounter.  Health maintenance exam: Reviewed age and gender appropriate health maintenance issues (prudent diet, regular exercise, health risks of tobacco and excessive alcohol, use of seatbelts, fire alarms in home, use of sunscreen).  Also reviewed age and gender appropriate health screening as well as vaccine  recommendations. Vaccines: Labs:HP, hba1c Prostate ca screening: average risk patient= as per latest guidelines, start screening at 45-50 yrs of age. Colon ca screening:  No polyps 2022.  recall 2032.  An After Visit Summary was printed and given to the patient.  FOLLOW UP:  No follow-ups on file.  Signed:  Santiago Bumpers, MD           01/15/2023

## 2023-01-16 ENCOUNTER — Encounter: Payer: Self-pay | Admitting: Family Medicine

## 2023-01-16 DIAGNOSIS — Z79899 Other long term (current) drug therapy: Secondary | ICD-10-CM

## 2023-01-16 DIAGNOSIS — Z0000 Encounter for general adult medical examination without abnormal findings: Secondary | ICD-10-CM

## 2023-01-16 DIAGNOSIS — F411 Generalized anxiety disorder: Secondary | ICD-10-CM

## 2023-01-16 DIAGNOSIS — E119 Type 2 diabetes mellitus without complications: Secondary | ICD-10-CM

## 2023-01-16 DIAGNOSIS — E7800 Pure hypercholesterolemia, unspecified: Secondary | ICD-10-CM

## 2023-01-16 DIAGNOSIS — F5104 Psychophysiologic insomnia: Secondary | ICD-10-CM

## 2023-01-28 ENCOUNTER — Encounter: Payer: Self-pay | Admitting: Family Medicine

## 2023-02-14 ENCOUNTER — Other Ambulatory Visit: Payer: Self-pay | Admitting: Family Medicine

## 2023-06-07 ENCOUNTER — Other Ambulatory Visit: Payer: Self-pay | Admitting: Family Medicine

## 2023-07-29 ENCOUNTER — Telehealth: Payer: Self-pay | Admitting: Family Medicine

## 2023-07-29 ENCOUNTER — Encounter: Payer: Self-pay | Admitting: Family Medicine

## 2023-07-29 ENCOUNTER — Telehealth: Payer: Self-pay

## 2023-07-29 DIAGNOSIS — E88819 Insulin resistance, unspecified: Secondary | ICD-10-CM

## 2023-07-29 DIAGNOSIS — E7800 Pure hypercholesterolemia, unspecified: Secondary | ICD-10-CM

## 2023-07-29 DIAGNOSIS — F419 Anxiety disorder, unspecified: Secondary | ICD-10-CM

## 2023-07-29 DIAGNOSIS — Z79899 Other long term (current) drug therapy: Secondary | ICD-10-CM

## 2023-07-29 DIAGNOSIS — F5105 Insomnia due to other mental disorder: Secondary | ICD-10-CM

## 2023-07-29 DIAGNOSIS — E559 Vitamin D deficiency, unspecified: Secondary | ICD-10-CM

## 2023-07-29 MED ORDER — CLONAZEPAM 1 MG PO TABS: 1.0000 mg | ORAL_TABLET | Freq: Every day | ORAL | 1 refills | Status: DC

## 2023-07-29 MED ORDER — ROSUVASTATIN CALCIUM 5 MG PO TABS: 5.0000 mg | ORAL_TABLET | Freq: Every day | ORAL | 1 refills | Status: DC

## 2023-07-29 NOTE — Telephone Encounter (Signed)
 Copied from CRM [id number]. Topic: Clinical - Prescription Issue >> Jul 29, 2023  4:05 PM Tony [name] wrote: Reason for CRM: Patient was seen today, needs new script for the clonazePAM  (KLONOPIN ) 1 MG tablet medication resent to [facility] pharmacy - the script says he takes 1 a day and he takes 3 a day.   Please confirm if sig directions correct

## 2023-07-29 NOTE — Progress Notes (Signed)
 Virtual Visit via Video Note  I connected withNAME@  on 07/29/23 at 10:00 AM EDT by a video enabled telemedicine application and verified that I am speaking with the correct person using two identifiers.  Location patient: [place] Location provider:work or home office Persons participating in the virtual visit: patient, provider  I discussed the limitations and requested verbal permission for telemedicine visit. The patient expressed understanding and agreed to proceed.  CC: 48 year old male being seen today for follow-up GERD, insulin  resistance, hyperlipidemia, anxiety and anxiety-related insomnia.  I last saw him for all of this on 11/09/2021. A/P as of that visit: "#1 prediabetes.  Doing well on metformin  500 mg every morning. Fasting glucose and hemoglobin A1c today. Making improvements on diet and exercise.   #2 hyperlipidemia. Doing well on rosuvastatin  5 mg a day. Lipid panel and hepatic panel today.   3.  GAD.  Stable on Zoloft 12.5 mg a day and clonazepam  1 mg 3 times daily. Urine drug screen today.  Controlled substance contract updated.   #4 insomnia.  Does well with eszopiclone  3 mg nightly and takes this nearly every night.   #5 vitamin D  deficiency.  He continues on 50,000 unit pill every week. Vitamin D  level today."  INTERIM HX: Sus[place] is doing very well. He has been working hard. Is going to the beach for a little vacation currently.  He takes his clonazepam  daily and this helps with anxiety.  He takes Lunesta  3 mg but only a couple of days a week for insomnia.  The meds that he takes daily are: Tegretol 100 mg twice daily, clonazepam  1 mg a day, propranolol  10 mg twice daily, rosuvastatin  5 mg daily, and sertraline 12.5 mg a day.  He had been followed regularly by the [place] [facility] but it looks like he stopped seeing them towards the end of 2024.  PMP AWARE reviewed today: most recent rx for clonazepam  was filled 09/27/22, # 90, rx  by me.  Most recent gabapentin  prescription was filled 03/05/2023, #90, prescription by Napolean Backbone, DO.  Most recent eszopiclone  prescription filled 03/26/2022, #90, prescription by me. No red flags.   ROS: See pertinent positives and negatives per HPI.  Past Medical History:  Diagnosis Date   Alcohol abuse 2016/2017   Pt states he was self medicating his anxiety.  Quit 04/2015.  Minimal intake as of 05/2017 f/u.   Anal fissure    Anxiety and depression    Back pain    Borderline diabetes    Erectile dysfunction    cialis 5mg  qd-qod started by urol 09/2020   Hepatic steatosis 2015; 2016; 02/2016   +Alcohol has played a role.  CT abd showed fatty liver+ liver enzymes mildly elevated (chron viral hepatitis screening NEG).  Abd u/s 03/26/16 showed fatty liver, small GB polyps, o/w normal.   History of kidney stones    [facility]   HTN (hypertension)    Hx of colonic polyp - ssp 02/11/2014   No polyps 02/2020->recall 10 yrs   Hyperlipidemia    Statin started 05/2018   Insomnia    Obesity, Class II, BMI 35-39.9    Psoriasis    Secondary male hypogonadism    clomiphene  06/2019: normalized on clomiphene  08/2019   Shortness of breath on exertion     Past Surgical History:  Procedure Laterality Date   APPENDECTOMY     COLONOSCOPY  03/10/2020   No polyps. Recall 2032.   COLONOSCOPY W/ BIOPSIES  02/03/14  Cecal polyp (sessile serrated polyp w/out dysplasia) and posterior anal fissure; repeat TCS 2020 per Dr. Willy Harvest   HAND SURGERY Left 2001   broke left pinkie finger   LAPAROSCOPIC APPENDECTOMY N/A 02/24/2015   Procedure: APPENDECTOMY LAPAROSCOPIC;  Surgeon: Juanita Norlander, MD;  Location: [facility];  Service: General;  Laterality: N/A;   right ankle     2014     Current Outpatient Medications:    carbamazepine (TEGRETOL) 100 MG chewable tablet, TAKE 1 TABLET BY MOUTH IN THE MORNING THEN TAKE 2 TABLETS BY MOUTH AT BEDTIME, Disp: , Rfl:    clonazePAM  (KLONOPIN ) 1 MG tablet, TAKE 1 TABLET BY  MOUTH DAILY AS NEEDED FOR ANXIETY **PLEASE SCHEDULE OFFICE VISIT**, Disp: 15 tablet, Rfl: 0   Eszopiclone  3 MG TABS, TAKE ONE TABLET BY MOUTH EVERY EVENING IMMEDIATLEY  BEFORE BEDTIME.  Needs follow up prior to any further refills., Disp: 15 tablet, Rfl: 0   gabapentin  (NEURONTIN ) 300 MG capsule, TAKE 1 CAPSULE BY MOUTH AT BEDTIME, Disp: 90 capsule, Rfl: 0   meloxicam  (MOBIC ) 15 MG tablet, TAKE 1 TABLET BY MOUTH DAILY AS NEEDED FOR PAIN, Disp: 30 tablet, Rfl: 0   metFORMIN  (GLUCOPHAGE ) 500 MG tablet, TAKE 1 TABLET BY MOUTH DAILY WITH BREAKFAST, Disp: 90 tablet, Rfl: 0   methocarbamol  (ROBAXIN -750) 750 MG tablet, Take 1 tablet (750 mg total) by mouth 4 (four) times daily., Disp: 30 tablet, Rfl: 3   Multiple Vitamin (MULTIVITAMIN ADULT PO), Take by mouth daily., Disp: , Rfl:    omeprazole  (PRILOSEC) 40 MG capsule, Take 1 capsule (40 mg total) by mouth daily., Disp: 90 capsule, Rfl: 3   polyethylene glycol powder (GLYCOLAX /MIRALAX ) 17 GM/SCOOP powder, 17 gram twice daily until stooling regularly, then once daily and prn, Disp: 3350 g, Rfl: 1   propranolol  (INDERAL ) 10 MG tablet, Take 10 mg by mouth 2 (two) times daily., Disp: , Rfl:    rosuvastatin  (CRESTOR ) 5 MG tablet, TAKE 1 TABLET BY MOUTH DAILY, Disp: 30 tablet, Rfl: 0   sertraline (ZOLOFT) 25 MG tablet, Take 12.5 mg by mouth daily., Disp: , Rfl:    fluticasone  (FLONASE ) 50 MCG/ACT nasal spray, Place 2 sprays into both nostrils daily. (Patient not taking: Reported on 07/29/2023), Disp: 16 g, Rfl: 0   Vitamin D , Ergocalciferol , (DRISDOL ) 1.25 MG (50000 UNIT) CAPS capsule, TAKE ONE CAPSULE BY MOUTH EVERY 7 DAYS (Patient not taking: Reported on 07/29/2023), Disp: 12 capsule, Rfl: 0  EXAM:  VITALS per patient if applicable:     12/18/2021   12:41 PM 12/12/2021    1:00 PM 11/14/2021    9:00 AM  Vitals with BMI  Height 5\' 9"  5\' 9"  5\' 9"   Weight 248 lbs 238 lbs 242 lbs  BMI 36.61 35.13 35.72  Systolic 124 120 409  Diastolic 84 79 67  Pulse 78 70  61     GENERAL: alert, oriented, appears well and in no acute distress  HEENT: atraumatic, conjunttiva clear, no obvious abnormalities on inspection of external nose and ears  NECK: normal movements of the head and neck  LUNGS: on inspection no signs of respiratory distress, breathing rate appears normal, no obvious gross SOB, gasping or wheezing  CV: no obvious cyanosis  MS: moves all visible extremities without noticeable abnormality  PSYCH/NEURO: pleasant and cooperative, no obvious depression or anxiety, speech and thought processing grossly intact  LABS: none today  Lab Results  Component Value Date   TSH 3.38 04/23/2021   Lab Results  Component Value Date  WBC 4.8 04/23/2021   HGB 14.6 04/23/2021   HCT 42.7 04/23/2021   MCV 94.4 04/23/2021   PLT 143.0 (L) 04/23/2021   Lab Results  Component Value Date   CREATININE 1.07 11/09/2021   BUN 23 11/09/2021   NA 138 11/09/2021   K 3.8 11/09/2021   CL 101 11/09/2021   CO2 28 11/09/2021   Lab Results  Component Value Date   ALT 23 11/09/2021   AST 22 11/09/2021   ALKPHOS 66 11/09/2021   BILITOT 0.5 11/09/2021   Lab Results  Component Value Date   CHOL 138 11/09/2021   Lab Results  Component Value Date   HDL 41.10 11/09/2021   Lab Results  Component Value Date   LDLCALC 75 11/09/2021   Lab Results  Component Value Date   TRIG 108.0 11/09/2021   Lab Results  Component Value Date   CHOLHDL 3 11/09/2021   Lab Results  Component Value Date   HGBA1C 5.6 11/09/2021   Lab Results  Component Value Date   TESTOSTERONE  526.92 09/09/2019   ASSESSMENT AND PLAN:  Discussed the following assessment and plan:  #1 hypercholesterolemia, doing well long-term on rosuvastatin  5 mg a day. Lipid panel and hepatic panel ordered future.  2.  Insulin  resistance. Was on metformin  at one point in time, managed by the [facility] wellness clinic.  It is not clear how long he has been off this  medication. Fasting glucose and hemoglobin A1c ordered future.  3.  GAD.  He has taken sertraline 12.5 mg a day long-term. Clonazepam  1 mg day has helped as well.  Refilled clonazepam  1 mg #90 refill x 1. When he comes in for labs we will have him update his controlled substance contract and get a urine drug screen.  #4 history of vitamin D  deficiency. He takes high-dose supplement that was prescribed by the healthy weight and wellness clinic. Check vitamin D  level future.  F/u: 6 months  Signed:  Phil Ilham Roughton, MD           07/29/2023

## 2023-07-30 MED ORDER — CLONAZEPAM 1 MG PO TABS: ORAL_TABLET | ORAL | 5 refills | Status: DC

## 2023-07-30 NOTE — Telephone Encounter (Signed)
Pt advised new rx sent. 

## 2023-07-30 NOTE — Addendum Note (Signed)
 Addended by: Shelvia Dick on: 07/30/2023 07:58 AM   Modules accepted: Orders

## 2023-07-30 NOTE — Telephone Encounter (Signed)
[place], new rx sent 

## 2023-08-05 NOTE — Addendum Note (Signed)
 Addended by: Shelvia Dick on: 08/05/2023 09:36 AM   Modules accepted: Level of Service

## 2023-08-05 NOTE — Progress Notes (Signed)
 OK done

## 2023-08-13 ENCOUNTER — Other Ambulatory Visit: Payer: Self-pay

## 2023-09-10 ENCOUNTER — Other Ambulatory Visit: Payer: Self-pay

## 2023-09-29 ENCOUNTER — Other Ambulatory Visit (INDEPENDENT_AMBULATORY_CARE_PROVIDER_SITE_OTHER): Payer: Self-pay

## 2023-09-29 DIAGNOSIS — F419 Anxiety disorder, unspecified: Secondary | ICD-10-CM

## 2023-09-29 DIAGNOSIS — E88819 Insulin resistance, unspecified: Secondary | ICD-10-CM

## 2023-09-29 DIAGNOSIS — Z79899 Other long term (current) drug therapy: Secondary | ICD-10-CM

## 2023-09-29 DIAGNOSIS — E559 Vitamin D deficiency, unspecified: Secondary | ICD-10-CM

## 2023-09-29 DIAGNOSIS — E7800 Pure hypercholesterolemia, unspecified: Secondary | ICD-10-CM

## 2023-09-29 LAB — LIPID PANEL
Cholesterol: 193 mg/dL (ref 0–200)
HDL: 42.9 mg/dL (ref >39.00)
LDL Cholesterol: 111 mg/dL — ABNORMAL HIGH (ref 0–99)
NonHDL: 150.47
Total CHOL/HDL Ratio: 5
Triglycerides: 198 mg/dL — ABNORMAL HIGH (ref 0.0–149.0)
VLDL: 39.6 mg/dL (ref 0.0–40.0)

## 2023-09-29 LAB — COMPREHENSIVE METABOLIC PANEL WITH GFR
ALT: 64 U/L — ABNORMAL HIGH (ref 0–53)
AST: 47 U/L — ABNORMAL HIGH (ref 0–37)
Albumin: 4.5 g/dL (ref 3.5–5.2)
Alkaline Phosphatase: 71 U/L (ref 39–117)
BUN: 15 mg/dL (ref 6–23)
CO2: 28 meq/L (ref 19–32)
Calcium: 8.9 mg/dL (ref 8.4–10.5)
Chloride: 102 meq/L (ref 96–112)
Creatinine, Ser: 0.91 mg/dL (ref 0.40–1.50)
GFR: 100.61 mL/min (ref >60.00)
Glucose, Bld: 92 mg/dL (ref 70–99)
Potassium: 4.1 meq/L (ref 3.5–5.1)
Sodium: 138 meq/L (ref 135–145)
Total Bilirubin: 0.4 mg/dL (ref 0.2–1.2)
Total Protein: 6.9 g/dL (ref 6.0–8.3)

## 2023-09-29 LAB — HEMOGLOBIN A1C: Hgb A1c MFr Bld: 6.1 % (ref 4.6–6.5)

## 2023-09-29 LAB — VITAMIN D 25 HYDROXY (VIT D DEFICIENCY, FRACTURES): VITD: 26.24 ng/mL — ABNORMAL LOW (ref 30.00–100.00)

## 2023-09-29 LAB — TSH: TSH: 2.64 u[IU]/mL (ref 0.35–5.50)

## 2023-09-30 ENCOUNTER — Ambulatory Visit: Payer: Self-pay | Admitting: Family Medicine

## 2023-09-30 DIAGNOSIS — R7401 Elevation of levels of liver transaminase levels: Secondary | ICD-10-CM

## 2023-09-30 DIAGNOSIS — K760 Fatty (change of) liver, not elsewhere classified: Secondary | ICD-10-CM

## 2023-09-30 MED ORDER — ROSUVASTATIN CALCIUM 10 MG PO TABS: 10.0000 mg | ORAL_TABLET | Freq: Every day | ORAL | 1 refills | Status: DC

## 2023-09-30 MED ORDER — METFORMIN HCL ER 500 MG PO TB24: ORAL_TABLET | ORAL | 1 refills | Status: DC

## 2023-10-01 LAB — DRUG MONITORING PANEL 376104, URINE
Amphetamines: NEGATIVE ng/mL (ref <500)
Barbiturates: NEGATIVE ng/mL (ref <300)
Benzodiazepines: NEGATIVE ng/mL (ref <100)
Cocaine Metabolite: NEGATIVE ng/mL (ref <150)
Desmethyltramadol: NEGATIVE ng/mL (ref <100)
Opiates: NEGATIVE ng/mL (ref <100)
Oxycodone: NEGATIVE ng/mL (ref <100)
Tramadol: NEGATIVE ng/mL (ref <100)

## 2023-10-01 LAB — DM TEMPLATE

## 2023-10-14 ENCOUNTER — Other Ambulatory Visit: Payer: Self-pay

## 2023-10-14 MED ORDER — ESZOPICLONE 3 MG PO TABS: ORAL_TABLET | ORAL | 1 refills | Status: AC

## 2023-10-14 MED ORDER — CLONAZEPAM 1 MG PO TABS: ORAL_TABLET | ORAL | 1 refills | Status: AC

## 2023-10-14 MED ORDER — METFORMIN HCL ER 500 MG PO TB24: ORAL_TABLET | ORAL | 1 refills | Status: AC

## 2023-10-14 NOTE — Telephone Encounter (Signed)
 CRM # [id number] Owner: None Status: Unresolved Open  Priority: Routine Created on: 10/14/2023 10:27 AM By: Archer Chiquita HERO   Primary Information  Source  Tony [name] (Patient)   Subject  Tony [name] (Patient)   Topic  Clinical - Prescription Issue    Communication  Reason for CRM: Patient is calling in stating the clonazePAM  (KLONOPIN ) 1 MG tablet [[id number]] has been sent over as a 30 day supply, patient is asking for this to be switched back to 90 days as it is cheaper. Patient is asking if all medications can be a 90 day supply as well for the future.     Patient Information  Patient Name Gender [name] SSN  Tony [name], Tony [name] Male 1975/10/20 [id number]   Contacts  Contact Date/Time Type Contact Phone/Fax  10/14/2023 10:23 AM EDT Phone (Incoming) Howie, Rufus (Self) [phone]   Routing History   From To Priority  10/14/2023 10:29 AM Archer Chiquita HERO P [facility] Routine    Pt had virtual appt on 6/3 for [facility], labs done 8/4. Please advise if appropriate for 90 d/s instead of 30 d/s. Rx pending for all meds prescribed by PCP except rosuvastatin .

## 2023-10-20 ENCOUNTER — Ambulatory Visit
Admission: RE | Admit: 2023-10-20 | Discharge: 2023-10-20 | Disposition: A | Discharge status: Home / Self Care | Payer: Self-pay | Source: Ambulatory Visit | Attending: Family Medicine | Admitting: Family Medicine

## 2023-10-20 DIAGNOSIS — R7401 Elevation of levels of liver transaminase levels: Secondary | ICD-10-CM

## 2023-10-20 DIAGNOSIS — K760 Fatty (change of) liver, not elsewhere classified: Secondary | ICD-10-CM

## 2023-10-24 ENCOUNTER — Ambulatory Visit: Payer: Self-pay | Admitting: Family Medicine

## 2023-10-24 ENCOUNTER — Encounter: Payer: Self-pay | Admitting: Family Medicine

## 2023-11-10 ENCOUNTER — Ambulatory Visit: Payer: Self-pay | Admitting: Family Medicine

## 2023-11-18 ENCOUNTER — Other Ambulatory Visit: Payer: Self-pay | Admitting: Family Medicine

## 2023-11-18 NOTE — Telephone Encounter (Unsigned)
 Copied from CRM [id number]. Topic: Clinical - Medication Refill >> Nov 18, 2023  2:37 PM Chiquita SQUIBB wrote: Medication: carbamazepine  carbamazepine  (TEGRETOL ) 100 MG chewable tablet   Has the patient contacted their pharmacy? Yes- Patient was told by the pharmacy they have not heard back from the doctor.  (Agent: If no, request that the patient contact the pharmacy for the refill. If patient does not wish to contact the pharmacy document the reason why and proceed with request.) (Agent: If yes, when and what did the pharmacy advise?)  This is the patient's preferred pharmacy:   [id number] [place], [place] - [address] AT [facility] OF [address] [place] [address] Phone: [phone] Fax: [phone]  Is this the correct pharmacy for this prescription? Yes If no, delete pharmacy and type the correct one.   Has the prescription been filled recently? No  Is the patient out of the medication? Yes- Patient has been out of four days.   Has the patient been seen for an appointment in the last year OR does the patient have an upcoming appointment? Yes  Can we respond through MyChart? Yes  Agent: Please be advised that Rx refills may take up to 3 business days. We ask that you follow-up with your pharmacy.

## 2023-11-18 NOTE — Telephone Encounter (Signed)
 CRM #[id number]. Topic: Clinical - Medication Refill >> Nov 18, 2023  2:37 PM Chiquita SQUIBB wrote: Medication: carbamazepine  carbamazepine  (TEGRETOL ) 100 MG chewable tablet

## 2023-11-18 NOTE — Telephone Encounter (Signed)
 Pt has appt tomorrow

## 2023-11-19 MED ORDER — CARBAMAZEPINE 100 MG PO CHEW: 100.0000 mg | CHEWABLE_TABLET | Freq: Two times a day (BID) | ORAL | 0 refills | Status: DC

## 2023-11-20 ENCOUNTER — Other Ambulatory Visit: Payer: Self-pay | Admitting: Family Medicine

## 2023-11-20 ENCOUNTER — Other Ambulatory Visit: Payer: Self-pay

## 2023-11-20 MED ORDER — GABAPENTIN 300 MG PO CAPS: 300.0000 mg | ORAL_CAPSULE | Freq: Every day | ORAL | 0 refills | Status: DC

## 2023-11-20 NOTE — Telephone Encounter (Signed)
 Please contact patient  Copied from CRM #[id number]. Topic: Clinical - Prescription Issue >> Nov 20, 2023  4:16 PM Tony [name] wrote: Reason for CRM: Patient is calling to see why Dr. Candise denied his refill request for methocarbamol  (ROBAXIN ) 750 MG tablet.

## 2023-11-20 NOTE — Telephone Encounter (Signed)
 Patient scheduled an appointment with Dr Claudene on 12/04/23 (first available). If you are able to refill this before his appointment, he asked if it could be sent as a 90 day supply so that it is more cost effective for him (if not, this can be done at his visit)?  Please advise.

## 2023-11-21 MED ORDER — METHOCARBAMOL 750 MG PO TABS: 750.0000 mg | ORAL_TABLET | Freq: Four times a day (QID) | ORAL | 3 refills | Status: DC

## 2023-11-21 NOTE — Telephone Encounter (Signed)
 Medication was d/c during appt on 07/29/23. Last refill 03/01/22 (30,3).  Please fill, if appropriate.

## 2023-11-24 ENCOUNTER — Ambulatory Visit: Payer: Self-pay | Admitting: Family Medicine

## 2023-12-03 NOTE — Progress Notes (Unsigned)
 [facility] [address] [place] [address] Phone: [phone] Subjective:   Tony [name], am serving as a scribe for Dr. Arthea Claudene.  I'm seeing this patient by the request  of:  McGowen, Aleene DEL, MD  CC: Low back pain  YEP:Dlagzrupcz  Tony [name] is a 48 y.o. male coming in with complaint of LBP. OMT 2023. Patient states that gabapentin  300mg  and Robaxin  have been helpful. Here for to check in and for med refills.     Since we have seen patient has been diagnosed with fatty liver disease. Past Medical History:  Diagnosis Date   Alcohol abuse 2016/2017   Pt states he was self medicating his anxiety.  Quit 04/2015.  Minimal intake as of 05/2017 f/u.   Anal fissure    Anxiety and depression    Back pain    Borderline diabetes    Erectile dysfunction    cialis 5mg  qd-qod started by urol 09/2020   Hepatic steatosis 2015; 2016; 02/2016   +Alcohol has played a role.  CT abd showed fatty liver+ liver enzymes mildly elevated (chron viral hepatitis screening NEG).  Abd u/s 03/26/16 showed fatty liver, small GB polyps, o/w normal.  09/2023 u/s unchanged.   History of kidney stones    [facility]   HTN (hypertension)    Hx of colonic polyp - ssp 02/11/2014   No polyps 02/2020->recall 10 yrs   Hyperlipidemia    Statin started 05/2018   Insomnia    Obesity, Class II, BMI 35-39.9    Psoriasis    Secondary male hypogonadism    clomiphene  06/2019: normalized on clomiphene  08/2019   Shortness of breath on exertion    Past Surgical History:  Procedure Laterality Date   APPENDECTOMY     COLONOSCOPY  03/10/2020   No polyps. Recall 2032.   COLONOSCOPY W/ BIOPSIES  02/03/14   Cecal polyp (sessile serrated polyp w/out dysplasia) and posterior anal fissure; repeat TCS 2020 per Dr. Avram   HAND SURGERY Left 2001   broke left pinkie finger   LAPAROSCOPIC APPENDECTOMY N/A 02/24/2015   Procedure: APPENDECTOMY LAPAROSCOPIC;  Surgeon: Alm Angle, MD;   Location: [facility];  Service: General;  Laterality: N/A;   right ankle     2014   Social History   Socioeconomic History   Marital status: Married    Spouse name: Warren   Number of children: Not on file   Years of education: Not on file   Highest education level: Not on file  Occupational History   Occupation: Investment banker, corporate  Tobacco Use   Smoking status: Never   Smokeless tobacco: Never  Vaping Use   Vaping status: Never Used  Substance and Sexual Activity   Alcohol use: Not Currently    Alcohol/week: 0.0 standard drinks of alcohol    Comment: 2019 stopped alcohol   Drug use: No   Sexual activity: Yes    Partners: Female    Birth control/protection: None  Other Topics Concern   Not on file  Social History Narrative   Married (spring 2019), no children.   Orig from [place].   Occup: Therapist, sports.   No tobacco.   Alcohol: hx of heavy drinking.  Cut back as of 04/2015.   Social Drivers of Corporate investment banker Strain: Not on file  Food Insecurity: No Food Insecurity (10/04/2021)   Hunger Vital Sign    Worried About Running Out of Food in the Last Year:  Never true    Ran Out of Food in the Last Year: Never true  Transportation Needs: Not on file  Physical Activity: Not on file  Stress: Not on file  Social Connections: Not on file   No Known Allergies Family History  Problem Relation Age of Onset   Diabetes Mother    Restless legs syndrome Mother    Heart disease Mother    Heart attack Mother 51   Hypertension Mother    Hyperlipidemia Mother    Depression Mother    Anxiety disorder Mother    Sleep apnea Mother    Psoriasis Father    Sleep apnea Father    Stroke Neg Hx    Cancer Neg Hx    Colon cancer Neg Hx    Colon polyps Neg Hx    Esophageal cancer Neg Hx    Rectal cancer Neg Hx    Stomach cancer Neg Hx     Current Outpatient Medications (Endocrine & Metabolic):    metFORMIN  (GLUCOPHAGE -XR) 500 MG 24 hr tablet, 1 tab po bid  Current  Outpatient Medications (Cardiovascular):    propranolol  (INDERAL ) 10 MG tablet, Take 10 mg by mouth 2 (two) times daily.   rosuvastatin  (CRESTOR ) 10 MG tablet, Take 1 tablet (10 mg total) by mouth daily.     Current Outpatient Medications (Other):    carbamazepine  (TEGRETOL ) 100 MG chewable tablet, Chew 1 tablet (100 mg total) by mouth 2 (two) times daily. MUST KEEP APPT FOR FURTHER REFILLS   clonazePAM  (KLONOPIN ) 1 MG tablet, 1 tab tid prn   Eszopiclone  3 MG TABS, TAKE ONE TABLET BY MOUTH EVERY EVENING IMMEDIATLEY  BEFORE BEDTIME.   Multiple Vitamin (MULTIVITAMIN ADULT PO), Take by mouth daily.   sertraline (ZOLOFT) 25 MG tablet, Take 12.5 mg by mouth daily.   Vitamin D , Ergocalciferol , (DRISDOL ) 1.25 MG (50000 UNIT) CAPS capsule, TAKE ONE CAPSULE BY MOUTH EVERY 7 DAYS   gabapentin  (NEURONTIN ) 300 MG capsule, Take 1 capsule (300 mg total) by mouth at bedtime.   methocarbamol  (ROBAXIN -750) 750 MG tablet, Take 1 tablet (750 mg total) by mouth 2 (two) times daily as needed for muscle spasms.   Reviewed prior external information including notes and imaging from  primary care provider As well as notes that were available from care everywhere and other healthcare systems.  Past medical history, social, surgical and family history all reviewed in electronic medical record.  No pertanent information unless stated regarding to the chief complaint.   Review of Systems:  No headache, visual changes, nausea, vomiting, diarrhea, constipation, dizziness, abdominal pain, skin rash, fevers, chills, night sweats, weight loss, swollen lymph nodes, body aches, joint swelling, chest pain, shortness of breath, mood changes. POSITIVE muscle aches but only intermittently  Objective  Blood pressure 118/84, pulse 64, height 5' 9 (1.753 m), weight 277 lb (125.6 kg), SpO2 96%.   General: No apparent distress alert and oriented x3 mood and affect normal, dressed appropriately.  HEENT: Pupils equal, extraocular  movements intact  Respiratory: Patient's speak in full sentences and does not appear short of breath  Cardiovascular: No lower extremity edema, non tender, no erythema  Low back exam shows still has some mild loss lordosis, still has room for improvement in core strengthening.  Negative straight leg test noted but does have tightness of the hamstring bilaterally.  Negative FABER test.  5 out of 5 strength of the lower extremities.    Impression and Recommendations:    The above documentation has been reviewed  and is accurate and complete Zorah Backes M Zalayah Pizzuto, DO

## 2023-12-04 ENCOUNTER — Other Ambulatory Visit: Payer: Self-pay

## 2023-12-04 ENCOUNTER — Ambulatory Visit: Payer: Self-pay | Admitting: Family Medicine

## 2023-12-04 VITALS — BP 118/84 | HR 64 | Ht 69.0 in | Wt 277.0 lb

## 2023-12-04 DIAGNOSIS — M51362 Other intervertebral disc degeneration, lumbar region with discogenic back pain and lower extremity pain: Secondary | ICD-10-CM

## 2023-12-04 MED ORDER — GABAPENTIN 300 MG PO CAPS: 300.0000 mg | ORAL_CAPSULE | Freq: Every day | ORAL | 3 refills | Status: AC

## 2023-12-04 MED ORDER — METHOCARBAMOL 750 MG PO TABS: 750.0000 mg | ORAL_TABLET | Freq: Two times a day (BID) | ORAL | 3 refills | Status: AC | PRN

## 2023-12-04 NOTE — Assessment & Plan Note (Signed)
 Doing significantly well at this time.  Has responded to epidurals in the past as well.  Using the Robaxin  750 mg 1-2 times daily as well as the gabapentin  300 mg at night.  Patient has done very well and continues to stay active.  No real side effects to the medications.  Lab work being followed appropriately by primary care provider as well.  Follow-up with me as needed or annually if needed medication refills

## 2023-12-17 ENCOUNTER — Telehealth: Payer: Self-pay

## 2023-12-17 NOTE — Telephone Encounter (Signed)
 Copied from CRM [id number]. Topic: Clinical - Medication Question >> Dec 17, 2023  2:58 PM Suzen RAMAN wrote: Reason for CRM: Patient confused on the verbiage used pertaining to his metFORMIN  (GLUCOPHAGE -XR) 500 MG 24 hr tablet per patient Dr. Candise states for him to start back taken this medication twice daily but patient wasn't previously taking medication twice daily and currently isn't. Patient states he was told that he needed to repeat labs to gauge the effectiveness of his medication but he hasn't been taking it as he was instructed she is also isn't sure if he should keep Fridays appointment. Please contact patient to advised.    CB#[phone] (M)

## 2023-12-17 NOTE — Telephone Encounter (Signed)
 Spoke with pt and discussed lab results again pertaining to Metformin  recommendations. Pt stated he has never taken Metformin  twice daily and is not currently.  He will keep upcoming appt on 10/24 but wanted to confirm if he needs to take medication once or twice daily.

## 2023-12-18 NOTE — Telephone Encounter (Signed)
 I had intended for him to take metformin  500 mg twice a day but if he has been taking it only once a day that is fine.  Keep appointment.

## 2023-12-19 ENCOUNTER — Encounter: Payer: Self-pay | Admitting: Family Medicine

## 2023-12-19 ENCOUNTER — Ambulatory Visit (INDEPENDENT_AMBULATORY_CARE_PROVIDER_SITE_OTHER): Payer: Self-pay | Admitting: Family Medicine

## 2023-12-19 VITALS — BP 129/87 | HR 79 | Temp 99.2°F | Ht 69.0 in | Wt 284.4 lb

## 2023-12-19 DIAGNOSIS — K760 Fatty (change of) liver, not elsewhere classified: Secondary | ICD-10-CM

## 2023-12-19 DIAGNOSIS — E559 Vitamin D deficiency, unspecified: Secondary | ICD-10-CM

## 2023-12-19 DIAGNOSIS — E7800 Pure hypercholesterolemia, unspecified: Secondary | ICD-10-CM

## 2023-12-19 DIAGNOSIS — R7401 Elevation of levels of liver transaminase levels: Secondary | ICD-10-CM

## 2023-12-19 DIAGNOSIS — R7303 Prediabetes: Secondary | ICD-10-CM

## 2023-12-19 LAB — COMPREHENSIVE METABOLIC PANEL WITH GFR
ALT: 51 U/L (ref 0–53)
AST: 39 U/L — ABNORMAL HIGH (ref 0–37)
Albumin: 4.7 g/dL (ref 3.5–5.2)
Alkaline Phosphatase: 83 U/L (ref 39–117)
BUN: 20 mg/dL (ref 6–23)
CO2: 22 meq/L (ref 19–32)
Calcium: 9.2 mg/dL (ref 8.4–10.5)
Chloride: 104 meq/L (ref 96–112)
Creatinine, Ser: 0.95 mg/dL (ref 0.40–1.50)
GFR: 95.4 mL/min (ref >60.00)
Glucose, Bld: 97 mg/dL (ref 70–99)
Potassium: 4.2 meq/L (ref 3.5–5.1)
Sodium: 139 meq/L (ref 135–145)
Total Bilirubin: 0.5 mg/dL (ref 0.2–1.2)
Total Protein: 7.3 g/dL (ref 6.0–8.3)

## 2023-12-19 LAB — HEPATIC FUNCTION PANEL
ALT: 51 U/L (ref 0–53)
AST: 39 U/L — ABNORMAL HIGH (ref 0–37)
Albumin: 4.7 g/dL (ref 3.5–5.2)
Alkaline Phosphatase: 83 U/L (ref 39–117)
Bilirubin, Direct: 0.1 mg/dL (ref 0.0–0.3)
Total Bilirubin: 0.5 mg/dL (ref 0.2–1.2)
Total Protein: 7.3 g/dL (ref 6.0–8.3)

## 2023-12-19 LAB — VITAMIN D 25 HYDROXY (VIT D DEFICIENCY, FRACTURES): VITD: 32.04 ng/mL (ref 30.00–100.00)

## 2023-12-19 MED ORDER — SERTRALINE HCL 25 MG PO TABS: 25.0000 mg | ORAL_TABLET | Freq: Every day | ORAL | 0 refills | Status: AC

## 2023-12-19 MED ORDER — CARBAMAZEPINE 100 MG PO CHEW: 100.0000 mg | CHEWABLE_TABLET | Freq: Two times a day (BID) | ORAL | 0 refills | Status: AC

## 2023-12-19 NOTE — Progress Notes (Signed)
 OFFICE VISIT  12/19/2023  CC:  Chief Complaint  Patient presents with   Medical Management of Chronic Issues    Patient is a 48 y.o. male who presents for 79-month follow-up prediabetes, hypercholesterolemia, and NASH. A/P as of last visit: #1 hypercholesterolemia, doing well long-term on rosuvastatin  5 mg a day. Lipid panel and hepatic panel ordered future.   2.  Insulin  resistance. Was on metformin  at one point in time, managed by the [facility].  It is not clear how long he has been off this medication. Fasting glucose and hemoglobin A1c ordered future.   3.  GAD.  He has taken sertraline 12.5 mg a day long-term. Clonazepam  1 mg day has helped as well.  Refilled clonazepam  1 mg #90 refill x 1. When he comes in for labs we will have him update his controlled substance contract and get a urine drug screen.   #4 history of vitamin D  deficiency. He takes high-dose supplement that was prescribed by the [facility]. Check vitamin D  level future.  INTERIM HX: Liver ultrasound after last visit showed stable fatty liver with 1 gallbladder polyp. Lab results last visit showed hemoglobin A1c had risen to 6.1% and I recommended he start metformin  500 mg twice a day. I also recommended he increase his rosuvastatin  to 10 mg a day.  Feeling fine. He has actually been taking the metformin  500 mg once a day, not bid. Taking vit D typically q 2 wks. Working on [employer] and exercise, has lost 9 lb in the last 10d.   Past Medical History:  Diagnosis Date   Alcohol abuse 2016/2017   Pt states he was self medicating his anxiety.  Quit 04/2015.  Minimal intake as of 05/2017 f/u.   Anal fissure    Anxiety and depression    Back pain    Borderline diabetes    Erectile dysfunction    cialis 5mg  qd-qod started by urol 09/2020   Hepatic steatosis 2015; 2016; 02/2016   +Alcohol has played a role.  CT abd showed fatty liver+ liver enzymes mildly  elevated (chron viral hepatitis screening NEG).  Abd u/s 03/26/16 showed fatty liver, small GB polyps, o/w normal.  09/2023 u/s unchanged.   History of kidney stones    [facility]   HTN (hypertension)    Hx of colonic polyp - ssp 02/11/2014   No polyps 02/2020->recall 10 yrs   Hyperlipidemia    Statin started 05/2018   Insomnia    Obesity, Class II, BMI 35-39.9    Psoriasis    Secondary male hypogonadism    clomiphene  06/2019: normalized on clomiphene  08/2019   Shortness of breath on exertion     Past Surgical History:  Procedure Laterality Date   APPENDECTOMY     COLONOSCOPY  03/10/2020   No polyps. Recall 2032.   COLONOSCOPY W/ BIOPSIES  02/03/14   Cecal polyp (sessile serrated polyp w/out dysplasia) and posterior anal fissure; repeat TCS 2020 per Dr. Avram   HAND SURGERY Left 2001   broke left pinkie finger   LAPAROSCOPIC APPENDECTOMY N/A 02/24/2015   Procedure: APPENDECTOMY LAPAROSCOPIC;  Surgeon: Alm Angle, MD;  Location: [facility];  Service: General;  Laterality: N/A;   right ankle     2014    Outpatient Medications Prior to Visit  Medication Sig Dispense Refill   carbamazepine  (TEGRETOL ) 100 MG chewable tablet Chew 1 tablet (100 mg total) by mouth 2 (two) times daily. MUST KEEP APPT FOR  FURTHER REFILLS 60 tablet 0   clonazePAM  (KLONOPIN ) 1 MG tablet 1 tab tid prn 270 tablet 1   Eszopiclone  3 MG TABS TAKE ONE TABLET BY MOUTH EVERY EVENING IMMEDIATLEY  BEFORE BEDTIME. 90 tablet 1   gabapentin  (NEURONTIN ) 300 MG capsule Take 1 capsule (300 mg total) by mouth at bedtime. 90 capsule 3   metFORMIN  (GLUCOPHAGE -XR) 500 MG 24 hr tablet 1 tab po bid (Patient taking differently: Take 500 mg by mouth daily with breakfast. 1 tab po bid) 180 tablet 1   methocarbamol  (ROBAXIN -750) 750 MG tablet Take 1 tablet (750 mg total) by mouth 2 (two) times daily as needed for muscle spasms. 90 tablet 3   Multiple Vitamin (MULTIVITAMIN ADULT PO) Take by mouth daily.     propranolol  (INDERAL ) 10 MG  tablet Take 10 mg by mouth 2 (two) times daily.     rosuvastatin  (CRESTOR ) 10 MG tablet Take 1 tablet (10 mg total) by mouth daily. 90 tablet 1   sertraline (ZOLOFT) 25 MG tablet Take 12.5 mg by mouth daily.     Vitamin D , Ergocalciferol , (DRISDOL ) 1.25 MG (50000 UNIT) CAPS capsule TAKE ONE CAPSULE BY MOUTH EVERY 7 DAYS 12 capsule 0   No facility-administered medications prior to visit.    No Known Allergies  Review of Systems As per HPI  PE:    12/19/2023    9:38 AM 12/04/2023    8:30 AM 12/18/2021   12:41 PM  Vitals with BMI  Height 5' 9 5' 9 5' 9  Weight 284 lbs 6 oz 277 lbs 248 lbs  BMI 41.98 40.89 36.61  Systolic 129 118 875  Diastolic 87 84 84  Pulse 79 64 78   Physical Exam  General: Alert and well-appearing. Affect is pleasant, thought and speech are lucid. No further exam today  LABS:  Last CBC Lab Results  Component Value Date   WBC 4.8 04/23/2021   HGB 14.6 04/23/2021   HCT 42.7 04/23/2021   MCV 94.4 04/23/2021   MCH 32.2 02/16/2018   RDW 12.2 04/23/2021   PLT 143.0 (L) 04/23/2021   Last metabolic panel Lab Results  Component Value Date   GLUCOSE 92 09/29/2023   NA 138 09/29/2023   K 4.1 09/29/2023   CL 102 09/29/2023   CO2 28 09/29/2023   BUN 15 09/29/2023   CREATININE 0.91 09/29/2023   GFR 100.61 09/29/2023   CALCIUM  8.9 09/29/2023   PROT 6.9 09/29/2023   ALBUMIN 4.5 09/29/2023   LABGLOB 2.9 04/13/2020   AGRATIO 1.5 04/13/2020   BILITOT 0.4 09/29/2023   ALKPHOS 71 09/29/2023   AST 47 (H) 09/29/2023   ALT 64 (H) 09/29/2023   ANIONGAP 11 02/16/2018   Last lipids Lab Results  Component Value Date   CHOL 193 09/29/2023   HDL 42.90 09/29/2023   LDLCALC 111 (H) 09/29/2023   LDLDIRECT 157.0 07/02/2019   TRIG 198.0 (H) 09/29/2023   CHOLHDL 5 09/29/2023   Last hemoglobin A1c Lab Results  Component Value Date   HGBA1C 6.1 09/29/2023   Last thyroid  functions Lab Results  Component Value Date   TSH 2.64 09/29/2023   T3TOTAL 147  04/13/2020   T4TOTAL 5.4 04/13/2020   Last vitamin D  Lab Results  Component Value Date   VD25OH 26.24 (L) 09/29/2023   Last vitamin B12 and Folate Lab Results  Component Value Date   VITAMINB12 449 04/13/2020   FOLATE 11.8 04/13/2020   IMPRESSION AND PLAN:  1 hypercholesterolemia, doing well long-term on rosuvastatin   5 mg a day. LDL was 111 on 09/29/2023.  Increased rosuvastatin  to 10 mg a day at that time. He is not fasting today.  We will repeat lipid panel at next follow-up in 6 months. Checking hepatic panel today.   2.  Insulin  resistance. We restarted metformin  500 mg once a day a few months ago. Check hemoglobin A1c today.   3.  GAD.  Clonazepam  1 mg day has helped as well.   He sees a psychiatric provider named Darice Molt for history of mood disorder and she prescribes his Tegretol  100 mg twice daily and his sertraline 25 mg a day.  I gave a prescription for a short-term supply of these today to get him to his follow-up appointment with her next week.  #4  vitamin D  deficiency. He takes his 50K unit vitamin D  to have an average of every 2 weeks. Check vitamin D  level today.  #5 NASH. He is working harder on diet and exercise now. Checking hepatic panel today.  An After Visit Summary was printed and given to the patient.  FOLLOW UP: No follow-ups on file.  Signed:  [name], MD           12/19/2023

## 2023-12-19 NOTE — Patient Instructions (Signed)

## 2023-12-22 ENCOUNTER — Ambulatory Visit: Payer: Self-pay | Admitting: Family Medicine

## 2023-12-30 ENCOUNTER — Other Ambulatory Visit: Payer: Self-pay | Admitting: Family Medicine

## 2024-03-05 ENCOUNTER — Other Ambulatory Visit: Payer: Self-pay | Admitting: Family Medicine

## 2024-03-09 ENCOUNTER — Ambulatory Visit: Payer: Self-pay | Admitting: Family Medicine

## 2024-03-11 ENCOUNTER — Other Ambulatory Visit: Payer: Self-pay | Admitting: Family Medicine

## 2024-03-19 ENCOUNTER — Telehealth: Payer: Self-pay | Admitting: Family Medicine

## 2024-03-19 ENCOUNTER — Encounter: Payer: Self-pay | Admitting: Family Medicine

## 2024-03-19 DIAGNOSIS — J4520 Mild intermittent asthma, uncomplicated: Secondary | ICD-10-CM

## 2024-03-19 DIAGNOSIS — E559 Vitamin D deficiency, unspecified: Secondary | ICD-10-CM

## 2024-03-19 DIAGNOSIS — J111 Influenza due to unidentified influenza virus with other respiratory manifestations: Secondary | ICD-10-CM

## 2024-03-19 MED ORDER — ALBUTEROL SULFATE HFA 108 (90 BASE) MCG/ACT IN AERS: 2.0000 | INHALATION_SPRAY | Freq: Four times a day (QID) | RESPIRATORY_TRACT | 0 refills | Status: AC | PRN

## 2024-03-19 MED ORDER — VITAMIN D (ERGOCALCIFEROL) 1.25 MG (50000 UNIT) PO CAPS: ORAL_CAPSULE | ORAL | 1 refills | Status: AC

## 2024-03-19 MED ORDER — PREDNISONE 20 MG PO TABS: ORAL_TABLET | ORAL | 0 refills | Status: AC

## 2024-03-19 NOTE — Progress Notes (Signed)
 Virtual Visit via Video Note  I connected with Tony [name]  on 03/19/24 at  1:20 PM EST by a video enabled telemedicine application and verified that I am speaking with the correct person using two identifiers.  Location patient: [place] Location provider:work or home office Persons participating in the virtual visit: patient, provider  I discussed the limitations and requested verbal permission for telemedicine visit. The patient expressed understanding and agreed to proceed.   HPI: 49 y/o male being seen today for cough. 6 days ago he developed nasal congestion, postnasal drip, body aches, cough, fever. His upper respiratory symptoms have improved significantly and he no longer has any fever or bodyaches but he still has a significant dry/tight cough.  It comes and fits.  He does not feel like he is wheezing or short of breath. He still tired. Wife with similar illness that started a few days after his.  She went to the doctor and her testing for flu and COVID and strep were all negative.   ROS: See pertinent positives and negatives per HPI.  Past Medical History:  Diagnosis Date   Alcohol abuse 2016/2017   Pt states he was self medicating his anxiety.  Quit 04/2015.  Minimal intake as of 05/2017 f/u.   Anal fissure    Anxiety and depression    Back pain    Borderline diabetes    Erectile dysfunction    cialis 5mg  qd-qod started by urol 09/2020   Hepatic steatosis 2015; 2016; 02/2016   +Alcohol has played a role.  CT abd showed fatty liver+ liver enzymes mildly elevated (chron viral hepatitis screening NEG).  Abd u/s 03/26/16 showed fatty liver, small GB polyps, o/w normal.  09/2023 u/s unchanged.   History of kidney stones    [facility]   HTN (hypertension)    Hx of colonic polyp - ssp 02/11/2014   No polyps 02/2020->recall 10 yrs   Hyperlipidemia    Statin started 05/2018   Insomnia    Obesity, Class II, BMI 35-39.9    Psoriasis    Secondary male hypogonadism    clomiphene  06/2019:  normalized on clomiphene  08/2019   Shortness of breath on exertion     Past Surgical History:  Procedure Laterality Date   APPENDECTOMY     COLONOSCOPY  03/10/2020   No polyps. Recall 2032.   COLONOSCOPY W/ BIOPSIES  02/03/14   Cecal polyp (sessile serrated polyp w/out dysplasia) and posterior anal fissure; repeat TCS 2020 per Dr. Avram   HAND SURGERY Left 2001   broke left pinkie finger   LAPAROSCOPIC APPENDECTOMY N/A 02/24/2015   Procedure: APPENDECTOMY LAPAROSCOPIC;  Surgeon: Alm Angle, MD;  Location: [facility];  Service: General;  Laterality: N/A;   right ankle     2014    Current Medications[1]  EXAM:  VITALS per patient if applicable:     12/19/2023    9:38 AM 12/04/2023    8:30 AM 12/18/2021   12:41 PM  Vitals with BMI  Height 5' 9 5' 9 5' 9  Weight 284 lbs 6 oz 277 lbs 248 lbs  BMI 41.98 40.89 36.61  Systolic 129 118 875  Diastolic 87 84 84  Pulse 79 64 78     GENERAL: alert, oriented, appears well and in no acute distress  HEENT: atraumatic, conjunttiva clear, no obvious abnormalities on inspection of external nose and ears  NECK: normal movements of the head and neck  LUNGS: on inspection no signs of respiratory distress, breathing rate appears normal,  no obvious gross SOB, gasping or wheezing  CV: no obvious cyanosis  MS: moves all visible extremities without noticeable abnormality  PSYCH/NEURO: pleasant and cooperative, no obvious depression or anxiety, speech and thought processing grossly intact  LABS: none today    Chemistry      Component Value Date/Time   NA 139 12/19/2023 1002   NA 139 04/13/2020 0957   K 4.2 12/19/2023 1002   CL 104 12/19/2023 1002   CO2 22 12/19/2023 1002   BUN 20 12/19/2023 1002   BUN 13 04/13/2020 0957   CREATININE 0.95 12/19/2023 1002      Component Value Date/Time   CALCIUM  9.2 12/19/2023 1002   ALKPHOS 83 12/19/2023 1002   ALKPHOS 83 12/19/2023 1002   AST 39 (H) 12/19/2023 1002   AST 39 (H) 12/19/2023  1002   ALT 51 12/19/2023 1002   ALT 51 12/19/2023 1002   BILITOT 0.5 12/19/2023 1002   BILITOT 0.5 12/19/2023 1002   BILITOT 0.3 04/13/2020 0957     ASSESSMENT AND PLAN:  Discussed the following assessment and plan:  Influenza-like illness.   Reactive airway disease. Prednisone  40 mg a day x 5 days.  Albuterol  2 puffs every 6 hours as needed prescribed as well. May continue Mucinex  DM every 12 hours. Signs/symptoms to call or return for were reviewed and pt expressed understanding.    I discussed the assessment and treatment plan with the patient. The patient was provided an opportunity to ask questions and all were answered. The patient agreed with the plan and demonstrated an understanding of the instructions.   F/u: keep follow-up scheduled for next month  Signed:  [name], MD           03/19/2024     [1]  Current Outpatient Medications:    carbamazepine  (TEGRETOL ) 100 MG chewable tablet, Chew 1 tablet (100 mg total) by mouth 2 (two) times daily., Disp: 60 tablet, Rfl: 0   clonazePAM  (KLONOPIN ) 1 MG tablet, 1 tab tid prn, Disp: 270 tablet, Rfl: 1   Eszopiclone  3 MG TABS, TAKE ONE TABLET BY MOUTH EVERY EVENING IMMEDIATLEY  BEFORE BEDTIME., Disp: 90 tablet, Rfl: 1   gabapentin  (NEURONTIN ) 300 MG capsule, Take 1 capsule (300 mg total) by mouth at bedtime., Disp: 90 capsule, Rfl: 3   metFORMIN  (GLUCOPHAGE -XR) 500 MG 24 hr tablet, 1 tab po bid (Patient taking differently: Take 500 mg by mouth daily with breakfast. 1 tab po bid), Disp: 180 tablet, Rfl: 1   methocarbamol  (ROBAXIN -750) 750 MG tablet, Take 1 tablet (750 mg total) by mouth 2 (two) times daily as needed for muscle spasms., Disp: 90 tablet, Rfl: 3   Multiple Vitamin (MULTIVITAMIN ADULT PO), Take by mouth daily., Disp: , Rfl:    propranolol  (INDERAL ) 10 MG tablet, Take 10 mg by mouth 2 (two) times daily., Disp: , Rfl:    rosuvastatin  (CRESTOR ) 10 MG tablet, Take 1 tablet (10 mg total) by mouth daily., Disp: 90 tablet,  Rfl: 1   sertraline  (ZOLOFT ) 25 MG tablet, Take 1 tablet (25 mg total) by mouth daily., Disp: 15 tablet, Rfl: 0   Vitamin D , Ergocalciferol , (DRISDOL ) 1.25 MG (50000 UNIT) CAPS capsule, TAKE ONE CAPSULE BY MOUTH EVERY 7 DAYS (Patient not taking: Reported on 03/19/2024), Disp: 12 capsule, Rfl: 0

## 2024-03-26 ENCOUNTER — Other Ambulatory Visit: Payer: Self-pay | Admitting: Family Medicine

## 2024-04-06 ENCOUNTER — Ambulatory Visit: Payer: Self-pay | Admitting: Family Medicine

## 2024-05-10 ENCOUNTER — Ambulatory Visit: Payer: Self-pay | Admitting: Family Medicine

## 2024-06-18 ENCOUNTER — Ambulatory Visit: Payer: Self-pay | Admitting: Family Medicine

## 2024-12-02 ENCOUNTER — Ambulatory Visit: Payer: Self-pay | Admitting: Family Medicine
